# Patient Record
Sex: Male | Born: 1950
Health system: Southern US, Community
[De-identification: ages and names within clinical notes are randomized; demographics above are authoritative.]

## PROBLEM LIST (undated history)

## (undated) DIAGNOSIS — G43909 Migraine, unspecified, not intractable, without status migrainosus: Secondary | ICD-10-CM

## (undated) DIAGNOSIS — H269 Unspecified cataract: Secondary | ICD-10-CM

## (undated) DIAGNOSIS — M1990 Unspecified osteoarthritis, unspecified site: Secondary | ICD-10-CM

## (undated) DIAGNOSIS — K5790 Diverticulosis of intestine, part unspecified, without perforation or abscess without bleeding: Secondary | ICD-10-CM

## (undated) DIAGNOSIS — H9319 Tinnitus, unspecified ear: Secondary | ICD-10-CM

## (undated) DIAGNOSIS — M779 Enthesopathy, unspecified: Secondary | ICD-10-CM

## (undated) DIAGNOSIS — K219 Gastro-esophageal reflux disease without esophagitis: Secondary | ICD-10-CM

## (undated) DIAGNOSIS — Z9622 Myringotomy tube(s) status: Secondary | ICD-10-CM

## (undated) DIAGNOSIS — J45909 Unspecified asthma, uncomplicated: Secondary | ICD-10-CM

## (undated) DIAGNOSIS — E785 Hyperlipidemia, unspecified: Secondary | ICD-10-CM

## (undated) DIAGNOSIS — K635 Polyp of colon: Secondary | ICD-10-CM

## (undated) DIAGNOSIS — J329 Chronic sinusitis, unspecified: Secondary | ICD-10-CM

## (undated) HISTORY — DX: Unspecified cataract: H26.9

## (undated) HISTORY — DX: Hyperlipidemia, unspecified: E78.5

## (undated) HISTORY — DX: Migraine, unspecified, not intractable, without status migrainosus: G43.909

## (undated) HISTORY — DX: Myringotomy tube(s) status: Z96.22

## (undated) HISTORY — DX: Diverticulosis of intestine, part unspecified, without perforation or abscess without bleeding: K57.90

## (undated) HISTORY — DX: Tinnitus, unspecified ear: H93.19

## (undated) HISTORY — DX: Polyp of colon: K63.5

---
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
## 1981-09-01 HISTORY — PX: HAND SURGERY: SHX662

## 1993-09-01 HISTORY — PX: HERNIA REPAIR: SHX51

## 2005-09-01 HISTORY — PX: ELBOW SURGERY: SHX618

## 2014-09-13 LAB — HM COLONOSCOPY

## 2016-09-01 HISTORY — PX: MENISCUS REPAIR: SHX5179

## 2017-09-01 HISTORY — PX: EYE SURGERY: SHX253

## 2017-12-21 DIAGNOSIS — G43909 Migraine, unspecified, not intractable, without status migrainosus: Secondary | ICD-10-CM | POA: Insufficient documentation

## 2018-07-08 LAB — LIPID PANEL
Cholesterol: 178 (ref 0–200)
HDL: 74 — AB (ref 35–70)
LDL Cholesterol: 89
Triglycerides: 66 (ref 40–160)

## 2018-07-08 LAB — TSH: TSH: 0.69 (ref 0.41–5.90)

## 2018-07-08 LAB — HEPATIC FUNCTION PANEL
ALT: 31 (ref 10–40)
AST: 18 (ref 14–40)
Alkaline Phosphatase: 99 (ref 25–125)
Bilirubin, Total: 0.3

## 2018-07-08 LAB — BASIC METABOLIC PANEL
BUN: 21 (ref 4–21)
Chloride: 104 (ref 99–108)
Creatinine: 1.3 (ref 0.6–1.3)
Glucose: 109
Potassium: 4.8 (ref 3.4–5.3)
Sodium: 139 (ref 137–147)

## 2018-07-08 LAB — COMPREHENSIVE METABOLIC PANEL
Albumin: 4.3 (ref 3.5–5.0)
Calcium: 9.3 (ref 8.7–10.7)

## 2018-11-03 LAB — HM HIV SCREENING LAB: HM HIV Screening: NEGATIVE

## 2018-11-03 LAB — HM HEPATITIS C SCREENING LAB: HM Hepatitis Screen: NEGATIVE

## 2019-03-09 LAB — HM COLONOSCOPY

## 2019-04-06 LAB — HEMOGLOBIN A1C: Hemoglobin A1C: 5.4

## 2019-06-17 ENCOUNTER — Other Ambulatory Visit: Payer: Self-pay

## 2019-06-17 DIAGNOSIS — Z20822 Contact with and (suspected) exposure to covid-19: Secondary | ICD-10-CM

## 2019-06-19 LAB — NOVEL CORONAVIRUS, NAA: SARS-CoV-2, NAA: NOT DETECTED

## 2019-06-25 ENCOUNTER — Telehealth: Payer: Self-pay

## 2019-06-25 NOTE — Telephone Encounter (Signed)
Pt called to update SSN for MyChart enrollment.

## 2019-07-07 ENCOUNTER — Encounter: Payer: Self-pay | Admitting: Family Medicine

## 2019-07-07 ENCOUNTER — Ambulatory Visit (INDEPENDENT_AMBULATORY_CARE_PROVIDER_SITE_OTHER): Payer: Medicare PPO | Admitting: Family Medicine

## 2019-07-07 ENCOUNTER — Other Ambulatory Visit: Payer: Self-pay

## 2019-07-07 VITALS — BP 110/80 | HR 60 | Ht 66.0 in | Wt 156.0 lb

## 2019-07-07 DIAGNOSIS — E785 Hyperlipidemia, unspecified: Secondary | ICD-10-CM | POA: Diagnosis not present

## 2019-07-07 DIAGNOSIS — Z7689 Persons encountering health services in other specified circumstances: Secondary | ICD-10-CM | POA: Diagnosis not present

## 2019-07-07 DIAGNOSIS — Z23 Encounter for immunization: Secondary | ICD-10-CM

## 2019-07-07 MED ORDER — ROSUVASTATIN CALCIUM 10 MG PO TABS: 10.0000 mg | ORAL_TABLET | Freq: Every day | ORAL | 1 refills | Status: DC

## 2019-07-07 NOTE — Progress Notes (Signed)
Date:  07/07/2019   Name:  Blake Patrick   DOB:  [birth year]   MRN:  [id number]   Chief Complaint: Establish Care (pcp) and Flu Vaccine  Patient is a 68 year old male who presents for a establish care exam. The patient reports the following problems: daily migraine. Health maintenance has been reviewed needs influenza.  Hyperlipidemia This is a chronic problem. The current episode started more than 1 year ago. The problem is controlled. Recent lipid tests were reviewed and are normal. He has no history of chronic renal disease, diabetes, hypothyroidism, liver disease, obesity or nephrotic syndrome. There are no known factors aggravating his hyperlipidemia. Pertinent negatives include no chest pain, focal sensory loss, focal weakness, leg pain, myalgias or shortness of breath. Current antihyperlipidemic treatment includes statins. The current treatment provides moderate improvement of lipids. There are no compliance problems.  Risk factors for coronary artery disease include dyslipidemia and male sex.    Review of Systems  Constitutional: Negative for chills and fever.  HENT: Negative for drooling, ear discharge, ear pain and sore throat.   Respiratory: Negative for cough, shortness of breath and wheezing.   Cardiovascular: Negative for chest pain, palpitations and leg swelling.  Gastrointestinal: Negative for abdominal pain, blood in stool, constipation, diarrhea and nausea.  Endocrine: Negative for polydipsia.  Genitourinary: Negative for dysuria, frequency, hematuria and urgency.  Musculoskeletal: Negative for back pain, myalgias and neck pain.  Skin: Negative for rash.  Allergic/Immunologic: Negative for environmental allergies.  Neurological: Negative for dizziness, focal weakness and headaches.  Hematological: Does not bruise/bleed easily.  Psychiatric/Behavioral: Negative for suicidal ideas. The patient is not nervous/anxious.     Patient Active Problem List   Diagnosis Date Noted   . Migraines 12/21/2017    No Known Allergies  Past Surgical History:  Procedure Laterality Date  . ELBOW SURGERY Right 2007  . HAND SURGERY  1983  . HERNIA REPAIR  1995  . MENISCUS REPAIR Left 2018  . TONSILLECTOMY  1964    Social History   Tobacco Use  . Smoking status: Former Smoker    Types: Cigarettes    Quit date: 09/01/1988    Years since quitting: 30.8  . Smokeless tobacco: Never Used  Substance Use Topics  . Alcohol use: Yes    Alcohol/week: 1.0 standard drinks    Types: 1 Cans of beer per week    Comment: per week  . Drug use: Never     Medication list has been reviewed and updated.  Current Meds  Medication Sig  . BOTOX 100 units SOLR injection   . Calcium Carbonate-Vitamin D (RA CALCIUM PLUS VITAMIN D PO) Take 525 mg by mouth 2 (two) times daily.  . Cholecalciferol (VITAMIN D) 50 MCG (2000 UT) CAPS Take 2 capsules by mouth daily.  . [employer] TRIPLE MAGNESIUM COMPLEX PO Take 300 mg by mouth 2 (two) times daily.  . divalproex (DEPAKOTE) 250 MG DR tablet Take 250 mg by mouth daily.  . Glucosamine-Chondroitin (GLUCOSAMINE CHONDR COMPLEX PO) Take 3,700 mg by mouth daily.  Marland Kitchen lamoTRIgine (LAMICTAL) 200 MG tablet Take 200 mg by mouth 2 (two) times daily.  Marland Kitchen levofloxacin (LEVAQUIN) 500 MG tablet   . Magnesium 400 MG CAPS Take 1 capsule by mouth 2 (two) times daily.  . polycarbophil (FIBERCON) 625 MG tablet Take 1,250 mg by mouth 2 (two) times daily.  . Riboflavin 400 MG TABS Take 1 tablet by mouth daily.  . rosuvastatin (CRESTOR) 10 MG tablet Take 10 mg  by mouth daily.  . vitamin C (ASCORBIC ACID) 500 MG tablet Take 500 mg by mouth daily.  . Vitamin E 180 MG CAPS Take 1 capsule by mouth daily.  . Vitamins-Lipotropics (LIPO-FLAVONOID PLUS PO) Take 1,000 mg by mouth 2 (two) times daily.  . Zinc 50 MG CAPS Take 1 capsule by mouth daily.    PHQ 2/9 Scores 07/07/2019  PHQ - 2 Score 0  PHQ- 9 Score 0    BP Readings from Last 3 Encounters:  07/07/19 110/80     Physical Exam Vitals signs and nursing note reviewed.  HENT:     Head: Normocephalic.     Right Ear: Tympanic membrane, ear canal and external ear normal.     Left Ear: Tympanic membrane, ear canal and external ear normal.     Nose: Nose normal.  Eyes:     General: No scleral icterus.       Right eye: No discharge.        Left eye: No discharge.     Conjunctiva/sclera: Conjunctivae normal.     Pupils: Pupils are equal, round, and reactive to light.  Neck:     Musculoskeletal: Normal range of motion and neck supple.     Thyroid: No thyromegaly.     Vascular: No JVD.     Trachea: No tracheal deviation.  Cardiovascular:     Rate and Rhythm: Normal rate and regular rhythm.     Heart sounds: Normal heart sounds. No murmur. No friction rub. No gallop.   Pulmonary:     Effort: No respiratory distress.     Breath sounds: Normal breath sounds. No wheezing, rhonchi or rales.  Abdominal:     General: Bowel sounds are normal.     Palpations: Abdomen is soft. There is no mass.     Tenderness: There is no abdominal tenderness. There is no guarding or rebound.  Musculoskeletal: Normal range of motion.        General: No tenderness.  Lymphadenopathy:     Cervical: No cervical adenopathy.  Skin:    General: Skin is warm.     Findings: No rash.  Neurological:     General: No focal deficit present.     Mental Status: He is alert and oriented to person, place, and time.     Cranial Nerves: No cranial nerve deficit.     Deep Tendon Reflexes: Reflexes are normal and symmetric.     Wt Readings from Last 3 Encounters:  07/07/19 156 lb (70.8 kg)    BP 110/80   Pulse 60   Ht 5\' 6"  (1.676 m)   Wt 156 lb (70.8 kg)   BMI 25.18 kg/m   Assessment and Plan: 1. Establishing care with new doctor, encounter for Patient establishes care with new physician.  Patient's previous encounters, past medical history, and other diagnoses were discussed.  Patient will be seen neurologist for follow-up of  his migraines and further treatment.  2. Hyperlipidemia, unspecified hyperlipidemia type Chronic.  Controlled.  Continue rosuvastatin 10 mg once a day.  Will recheck in 6 months at which time we will do physical exam and obtain a lipid panel in a fasting setting. - rosuvastatin (CRESTOR) 10 MG tablet; Take 1 tablet (10 mg total) by mouth daily.  Dispense: 90 tablet; Refill: 1  3. Influenza vaccine needed Discussed and administered - Flu Vaccine QUAD High Dose(Fluad)

## 2019-09-06 DIAGNOSIS — I861 Scrotal varices: Secondary | ICD-10-CM | POA: Diagnosis not present

## 2019-09-20 DIAGNOSIS — H43812 Vitreous degeneration, left eye: Secondary | ICD-10-CM | POA: Diagnosis not present

## 2019-09-21 DIAGNOSIS — R519 Headache, unspecified: Secondary | ICD-10-CM | POA: Diagnosis not present

## 2019-09-21 DIAGNOSIS — I6782 Cerebral ischemia: Secondary | ICD-10-CM | POA: Diagnosis not present

## 2019-09-23 ENCOUNTER — Other Ambulatory Visit: Payer: Self-pay

## 2019-09-23 ENCOUNTER — Encounter: Payer: Self-pay | Admitting: Family Medicine

## 2019-09-27 DIAGNOSIS — N451 Epididymitis: Secondary | ICD-10-CM | POA: Diagnosis not present

## 2019-09-27 DIAGNOSIS — M76892 Other specified enthesopathies of left lower limb, excluding foot: Secondary | ICD-10-CM | POA: Diagnosis not present

## 2019-10-05 DIAGNOSIS — J3489 Other specified disorders of nose and nasal sinuses: Secondary | ICD-10-CM | POA: Diagnosis not present

## 2019-10-05 DIAGNOSIS — J321 Chronic frontal sinusitis: Secondary | ICD-10-CM | POA: Diagnosis not present

## 2019-10-05 DIAGNOSIS — R519 Headache, unspecified: Secondary | ICD-10-CM | POA: Diagnosis not present

## 2019-10-06 ENCOUNTER — Other Ambulatory Visit: Payer: Self-pay | Admitting: Otolaryngology

## 2019-10-06 ENCOUNTER — Other Ambulatory Visit (HOSPITAL_COMMUNITY): Payer: Self-pay | Admitting: Otolaryngology

## 2019-10-06 DIAGNOSIS — J329 Chronic sinusitis, unspecified: Secondary | ICD-10-CM

## 2019-10-13 ENCOUNTER — Other Ambulatory Visit: Payer: Self-pay

## 2019-10-13 ENCOUNTER — Ambulatory Visit
Admission: RE | Admit: 2019-10-13 | Discharge: 2019-10-13 | Disposition: A | Discharge status: Home / Self Care | Payer: Medicare PPO | Source: Ambulatory Visit | Attending: Otolaryngology | Admitting: Otolaryngology

## 2019-10-13 DIAGNOSIS — J329 Chronic sinusitis, unspecified: Secondary | ICD-10-CM | POA: Diagnosis not present

## 2019-10-17 DIAGNOSIS — J321 Chronic frontal sinusitis: Secondary | ICD-10-CM | POA: Diagnosis not present

## 2019-10-17 DIAGNOSIS — J342 Deviated nasal septum: Secondary | ICD-10-CM | POA: Diagnosis not present

## 2019-10-18 DIAGNOSIS — G43711 Chronic migraine without aura, intractable, with status migrainosus: Secondary | ICD-10-CM | POA: Diagnosis not present

## 2019-10-18 DIAGNOSIS — G4452 New daily persistent headache (NDPH): Secondary | ICD-10-CM | POA: Diagnosis not present

## 2019-10-18 DIAGNOSIS — R519 Headache, unspecified: Secondary | ICD-10-CM | POA: Diagnosis not present

## 2019-10-20 ENCOUNTER — Encounter: Payer: Self-pay | Admitting: Otolaryngology

## 2019-10-24 NOTE — Discharge Instructions (Signed)
[facility] ENDOSCOPIC SINUS SURGERY [facility]  What is Functional Endoscopic Sinus Surgery?  The Surgery involves making the natural openings of the sinuses larger by removing the bony partitions that separate the sinuses from the nasal cavity.  The natural sinus lining is preserved as much as possible to allow the sinuses to resume normal function after the surgery.  In some patients nasal polyps (excessively swollen lining of the sinuses) may be removed to relieve obstruction of the sinus openings.  The surgery is performed through the nose using lighted scopes, which eliminates the need for incisions on the face.  A septoplasty is a different procedure which is sometimes performed with sinus surgery.  It involves straightening the boy partition that separates the two sides of your nose.  A crooked or deviated septum may need repair if is obstructing the sinuses or nasal airflow.  Turbinate reduction is also often performed during sinus surgery.  The turbinates are bony proturberances from the side walls of the nose which swell and can obstruct the nose in patients with sinus and allergy problems.  Their size can be surgically reduced to help relieve nasal obstruction.  What Can Sinus Surgery Do For Me?  Sinus surgery can reduce the frequency of sinus infections requiring antibiotic treatment.  This can provide improvement in nasal congestion, post-nasal drainage, facial pressure and nasal obstruction.  Surgery will NOT prevent you from ever having an infection again, so it usually only for patients who get infections 4 or more times yearly requiring antibiotics, or for infections that do not clear with antibiotics.  It will not cure nasal allergies, so patients with allergies may still require medication to treat their allergies after surgery. Surgery may improve headaches related to sinusitis, however, some people will continue to  require medication to control sinus headaches related to allergies.  Surgery will do nothing for other forms of headache (migraine, tension or cluster).  What Are the Risks of Endoscopic Sinus Surgery?  Current techniques allow surgery to be performed safely with little risk, however, there are rare complications that patients should be aware of.  Because the sinuses are located around the eyes, there is risk of eye injury, including blindness, though again, this would be quite rare. This is usually a result of bleeding behind the eye during surgery, which puts the vision oat risk, though there are treatments to protect the vision and prevent permanent disrupted by surgery causing a leak of the spinal fluid that surrounds the brain.  More serious complications would include bleeding inside the brain cavity or damage to the brain.  Again, all of these complications are uncommon, and spinal fluid leaks can be safely managed surgically if they occur.  The most common complication of sinus surgery is bleeding from the nose, which may require packing or cauterization of the nose.  Continued sinus have polyps may experience recurrence of the polyps requiring revision surgery.  Alterations of sense of smell or injury to the tear ducts are also rare complications.   What is the Surgery Like, and what is the Recovery?  The Surgery usually takes a couple of hours to perform, and is usually performed under a general anesthetic (completely asleep).  Patients are usually discharged home after a couple of hours.  Sometimes during surgery it is necessary to pack the nose to control bleeding, and the packing is left in place for 24 - 48 hours, and removed by your surgeon.    If a septoplasty was performed during the procedure, there is often a splint placed which must be removed after 5-7 days.   Discomfort: Pain is usually mild to moderate, and can be controlled by prescription pain medication or acetaminophen (Tylenol).   Aspirin, Ibuprofen (Advil, Motrin), or Naprosyn (Aleve) should be avoided, as they can cause increased bleeding.  Most patients feel sinus pressure like they have a bad head cold for several days.  Sleeping with your head elevated can help reduce swelling and facial pressure, as can ice packs over the face.  A humidifier may be helpful to keep the mucous and blood from drying in the nose.   Diet: There are no specific diet restrictions, however, you should generally start with clear liquids and a light diet of bland foods because the anesthetic can cause some nausea.  Advance your diet depending on how your stomach feels.  Taking your pain medication with food will often help reduce stomach upset which pain medications can cause.  Nasal Saline Irrigation: It is important to remove blood clots and dried mucous from the nose as it is healing.  This is done by having you irrigate the nose at least 3 - 4 times daily with a salt water solution.  We recommend using NeilMed Sinus Rinse (available at the drug store).  Fill the squeeze bottle with the solution, bend over a sink, and insert the tip of the squeeze bottle into the nose  of an inch.  Point the tip of the squeeze bottle towards the inside corner of the eye on the same side your irrigating.  Squeeze the bottle and gently irrigate the nose.  If you bend forward as you do this, most of the fluid will flow back out of the nose, instead of down your throat.   The solution should be warm, near body temperature, when you irrigate.   Each time you irrigate, you should use a full squeeze bottle.   Note that if you are instructed to use Nasal Steroid Sprays at any time after your surgery, irrigate with saline BEFORE using the steroid spray, so you do not wash it all out of the nose. Another product, Nasal Saline Gel (such as AYR Nasal Saline Gel) can be applied in each nostril 3 - 4 times daily to moisture the nose and reduce scabbing or crusting.  Bleeding:   Bloody drainage from the nose can be expected for several days, and patients are instructed to irrigate their nose frequently with salt water to help remove mucous and blood clots.  The drainage may be dark red or brown, though some fresh blood may be seen intermittently, especially after irrigation.  Do not blow you nose, as bleeding may occur. If you must sneeze, keep your mouth open to allow air to escape through your mouth.  If heavy bleeding occurs: Irrigate the nose with saline to rinse out clots, then spray the nose 3 - 4 times with Afrin Nasal Decongestant Spray.  The spray will constrict the blood vessels to slow bleeding.  Pinch the lower half of your nose shut to apply pressure, and lay down with your head elevated.  Ice packs over the nose may help as well. If bleeding persists despite these measures, you should notify your doctor.  Do not use the Afrin routinely to control nasal congestion after surgery, as it can result in worsening congestion and may affect healing.     Activity: Return to work varies among patients. Most patients will be   out of work at least 5 - 7 days to recover.  Patient may return to work after they are off of narcotic pain medication, and feeling well enough to perform the functions of their job.  Patients must avoid heavy lifting (over 10 pounds) or strenuous physical for 2 weeks after surgery, so your employer may need to assign you to light duty, or keep you out of work longer if light duty is not possible.  NOTE: you should not drive, operate dangerous machinery, do any mentally demanding tasks or make any important legal or financial decisions while on narcotic pain medication and recovering from the general anesthetic.    Call Your Doctor Immediately if You Have Any of the Following: 1. Bleeding that you cannot control with the above measures 2. Loss of vision, double vision, bulging of the eye or black eyes. 3. Fever over 101 degrees 4. Neck stiffness with  severe headache, fever, nausea and change in mental state. You are always encourage to call anytime with concerns, however, please call with requests for pain medication refills during office hours.  Office Endoscopy: During follow-up visits your doctor will remove any packing or splints that may have been placed and evaluate and clean your sinuses endoscopically.  Topical anesthetic will be used to make this as comfortable as possible, though you may want to take your pain medication prior to the visit.  How often this will need to be done varies from patient to patient.  After complete recovery from the surgery, you may need follow-up endoscopy from time to time, particularly if there is concern of recurrent infection or nasal polyps.  General Anesthesia, Adult, Care After This sheet gives you information about how to care for yourself after your procedure. Your health care provider may also give you more specific instructions. If you have problems or questions, contact your health care provider. What can I expect after the procedure? After the procedure, the following side effects are common:  Pain or discomfort at the IV site.  Nausea.  Vomiting.  Sore throat.  Trouble concentrating.  Feeling cold or chills.  Weak or tired.  Sleepiness and fatigue.  Soreness and body aches. These side effects can affect parts of the body that were not involved in surgery. Follow these instructions at home:  For at least 24 hours after the procedure:  Have a responsible adult stay with you. It is important to have someone help care for you until you are awake and alert.  Rest as needed.  Do not: ? Participate in activities in which you could fall or become injured. ? Drive. ? Use heavy machinery. ? Drink alcohol. ? Take sleeping pills or medicines that cause drowsiness. ? Make important decisions or sign legal documents. ? Take care of children on your own. Eating and drinking  Follow  any instructions from your health care provider about eating or drinking restrictions.  When you feel hungry, start by eating small amounts of foods that are soft and easy to digest (bland), such as toast. Gradually return to your regular diet.  Drink enough fluid to keep your urine pale yellow.  If you vomit, rehydrate by drinking water, juice, or clear broth. General instructions  If you have sleep apnea, surgery and certain medicines can increase your risk for breathing problems. Follow instructions from your health care provider about wearing your sleep device: ? Anytime you are sleeping, including during daytime naps. ? While taking prescription pain medicines, sleeping medicines, or medicines that   make you drowsy.  Return to your normal activities as told by your health care provider. Ask your health care provider what activities are safe for you.  Take over-the-counter and prescription medicines only as told by your health care provider.  If you smoke, do not smoke without supervision.  Keep all follow-up visits as told by your health care provider. This is important. Contact a health care provider if:  You have nausea or vomiting that does not get better with medicine.  You cannot eat or drink without vomiting.  You have pain that does not get better with medicine.  You are unable to pass urine.  You develop a skin rash.  You have a fever.  You have redness around your IV site that gets worse. Get help right away if:  You have difficulty breathing.  You have chest pain.  You have blood in your urine or stool, or you vomit blood. Summary  After the procedure, it is common to have a sore throat or nausea. It is also common to feel tired.  Have a responsible adult stay with you for the first 24 hours after general anesthesia. It is important to have someone help care for you until you are awake and alert.  When you feel hungry, start by eating small amounts of  foods that are soft and easy to digest (bland), such as toast. Gradually return to your regular diet.  Drink enough fluid to keep your urine pale yellow.  Return to your normal activities as told by your health care provider. Ask your health care provider what activities are safe for you. This information is not intended to replace advice given to you by your health care provider. Make sure you discuss any questions you have with your health care provider. Document Revised: 08/21/2017 Document Reviewed: 04/03/2017 Elsevier Patient Education  2020 [employer]  

## 2019-10-25 ENCOUNTER — Other Ambulatory Visit: Payer: Self-pay

## 2019-10-25 ENCOUNTER — Other Ambulatory Visit
Admission: RE | Admit: 2019-10-25 | Discharge: 2019-10-25 | Disposition: A | Discharge status: Home / Self Care | Payer: Medicare PPO | Source: Ambulatory Visit | Attending: Otolaryngology | Admitting: Otolaryngology

## 2019-10-25 DIAGNOSIS — Z20822 Contact with and (suspected) exposure to covid-19: Secondary | ICD-10-CM | POA: Diagnosis not present

## 2019-10-25 DIAGNOSIS — Z01812 Encounter for preprocedural laboratory examination: Secondary | ICD-10-CM | POA: Diagnosis not present

## 2019-10-25 LAB — SARS CORONAVIRUS 2 (TAT 6-24 HRS): SARS Coronavirus 2: NEGATIVE

## 2019-10-27 ENCOUNTER — Ambulatory Visit
Admission: RE | Admit: 2019-10-27 | Discharge: 2019-10-27 | Disposition: A | Discharge status: Home / Self Care | Payer: Medicare PPO | Attending: Otolaryngology | Admitting: Otolaryngology

## 2019-10-27 ENCOUNTER — Ambulatory Visit: Payer: Medicare PPO | Admitting: Anesthesiology

## 2019-10-27 ENCOUNTER — Encounter: Payer: Self-pay | Admitting: Otolaryngology

## 2019-10-27 ENCOUNTER — Encounter
Admission: RE | Disposition: A | Discharge status: Home / Self Care | Payer: Self-pay | Source: Home / Self Care | Attending: Otolaryngology

## 2019-10-27 ENCOUNTER — Other Ambulatory Visit: Payer: Self-pay

## 2019-10-27 DIAGNOSIS — G43909 Migraine, unspecified, not intractable, without status migrainosus: Secondary | ICD-10-CM | POA: Diagnosis not present

## 2019-10-27 DIAGNOSIS — J329 Chronic sinusitis, unspecified: Secondary | ICD-10-CM | POA: Diagnosis not present

## 2019-10-27 DIAGNOSIS — J321 Chronic frontal sinusitis: Secondary | ICD-10-CM | POA: Diagnosis not present

## 2019-10-27 DIAGNOSIS — J342 Deviated nasal septum: Secondary | ICD-10-CM | POA: Diagnosis not present

## 2019-10-27 DIAGNOSIS — Z87891 Personal history of nicotine dependence: Secondary | ICD-10-CM | POA: Insufficient documentation

## 2019-10-27 DIAGNOSIS — Z9889 Other specified postprocedural states: Secondary | ICD-10-CM | POA: Diagnosis not present

## 2019-10-27 DIAGNOSIS — Z79899 Other long term (current) drug therapy: Secondary | ICD-10-CM | POA: Diagnosis not present

## 2019-10-27 DIAGNOSIS — J322 Chronic ethmoidal sinusitis: Secondary | ICD-10-CM | POA: Diagnosis not present

## 2019-10-27 HISTORY — DX: Unspecified osteoarthritis, unspecified site: M19.90

## 2019-10-27 HISTORY — PX: IMAGE GUIDED SINUS SURGERY: SHX6570

## 2019-10-27 HISTORY — DX: Gastro-esophageal reflux disease without esophagitis: K21.9

## 2019-10-27 HISTORY — PX: SEPTOPLASTY: SHX2393

## 2019-10-27 HISTORY — PX: ETHMOIDECTOMY: SHX5197

## 2019-10-27 SURGERY — SINUS SURGERY, WITH IMAGING GUIDANCE
Anesthesia: General | Site: Nose | Laterality: Bilateral

## 2019-10-27 MED ORDER — GLYCOPYRROLATE 0.2 MG/ML IJ SOLN
INTRAMUSCULAR | Status: DC | PRN
  Administered 2019-10-27: .1 mg via INTRAVENOUS

## 2019-10-27 MED ORDER — DEXAMETHASONE SODIUM PHOSPHATE 4 MG/ML IJ SOLN
INTRAMUSCULAR | Status: DC | PRN
  Administered 2019-10-27: 10 mg via INTRAVENOUS

## 2019-10-27 MED ORDER — HYDROCODONE-ACETAMINOPHEN 5-325 MG PO TABS: 1.0000 | ORAL_TABLET | Freq: Four times a day (QID) | ORAL | 0 refills | Status: DC | PRN

## 2019-10-27 MED ORDER — LIDOCAINE HCL (CARDIAC) PF 100 MG/5ML IV SOSY
PREFILLED_SYRINGE | INTRAVENOUS | Status: DC | PRN
  Administered 2019-10-27: 40 mg via INTRAVENOUS

## 2019-10-27 MED ORDER — FENTANYL CITRATE (PF) 100 MCG/2ML IJ SOLN
INTRAMUSCULAR | Status: DC | PRN
  Administered 2019-10-27: 50 ug via INTRAVENOUS

## 2019-10-27 MED ORDER — MIDAZOLAM HCL 5 MG/5ML IJ SOLN
INTRAMUSCULAR | Status: DC | PRN
  Administered 2019-10-27: 2 mg via INTRAVENOUS

## 2019-10-27 MED ORDER — SUCCINYLCHOLINE CHLORIDE 20 MG/ML IJ SOLN
INTRAMUSCULAR | Status: DC | PRN
  Administered 2019-10-27: 100 mg via INTRAVENOUS

## 2019-10-27 MED ORDER — ACETAMINOPHEN 10 MG/ML IV SOLN
1000.0000 mg | Freq: Once | INTRAVENOUS | Status: AC
  Administered 2019-10-27: 1000 mg via INTRAVENOUS

## 2019-10-27 MED ORDER — LIDOCAINE-EPINEPHRINE 1 %-1:100000 IJ SOLN
INTRAMUSCULAR | Status: DC | PRN
  Administered 2019-10-27: 3 mL

## 2019-10-27 MED ORDER — CEPHALEXIN 500 MG PO CAPS: 500.0000 mg | ORAL_CAPSULE | Freq: Two times a day (BID) | ORAL | 0 refills | Status: DC

## 2019-10-27 MED ORDER — FENTANYL CITRATE (PF) 100 MCG/2ML IJ SOLN: 25.0000 ug | INTRAMUSCULAR | Status: DC | PRN

## 2019-10-27 MED ORDER — OXYCODONE HCL 5 MG PO TABS
5.0000 mg | ORAL_TABLET | Freq: Once | ORAL | Status: AC | PRN
  Administered 2019-10-27: 13:00:00 5 mg via ORAL

## 2019-10-27 MED ORDER — LACTATED RINGERS IV SOLN: INTRAVENOUS | Status: DC | PRN

## 2019-10-27 MED ORDER — OXYCODONE HCL 5 MG/5ML PO SOLN: 5.0000 mg | Freq: Once | ORAL | Status: AC | PRN

## 2019-10-27 MED ORDER — PREDNISONE 10 MG PO TABS: ORAL_TABLET | ORAL | 0 refills | Status: DC

## 2019-10-27 MED ORDER — ONDANSETRON HCL 4 MG/2ML IJ SOLN: 4.0000 mg | Freq: Once | INTRAMUSCULAR | Status: DC | PRN

## 2019-10-27 MED ORDER — PHENYLEPHRINE HCL 0.5 % NA SOLN
NASAL | Status: DC | PRN
  Administered 2019-10-27: 11:00:00 15 mL via TOPICAL

## 2019-10-27 MED ORDER — PROPOFOL 10 MG/ML IV BOLUS
INTRAVENOUS | Status: DC | PRN
  Administered 2019-10-27: 130 mg via INTRAVENOUS

## 2019-10-27 MED ORDER — LACTATED RINGERS IV SOLN: 10.0000 mL/h | INTRAVENOUS | Status: DC

## 2019-10-27 MED ORDER — CEFAZOLIN SODIUM-DEXTROSE 2-4 GM/100ML-% IV SOLN
2.0000 g | Freq: Once | INTRAVENOUS | Status: AC
  Administered 2019-10-27: 2 g via INTRAVENOUS

## 2019-10-27 MED ORDER — OXYMETAZOLINE HCL 0.05 % NA SOLN
2.0000 | Freq: Once | NASAL | Status: AC
  Administered 2019-10-27: 09:00:00 2 via NASAL

## 2019-10-27 MED ORDER — EPHEDRINE SULFATE 50 MG/ML IJ SOLN
INTRAMUSCULAR | Status: DC | PRN
  Administered 2019-10-27: 5 mg via INTRAVENOUS
  Administered 2019-10-27 (×4): 10 mg via INTRAVENOUS
  Administered 2019-10-27: 5 mg via INTRAVENOUS

## 2019-10-27 SURGICAL SUPPLY — 41 items
BALLN CATH EUST TUBE 6X16 (BALLOONS)
BALLN SINUPLASTY KIT 6X16 (BALLOONS) ×3
BALLOON SINUPLASTY KIT 6X16 (BALLOONS) ×1 IMPLANT
BATTERY INSTRU NAVIGATION (MISCELLANEOUS) ×9 IMPLANT
CANISTER SUCT 1200ML W/VALVE (MISCELLANEOUS) ×3 IMPLANT
CATH BALLOON EUST TUBE 6X16 (BALLOONS) IMPLANT
CATH IV 18X1 1/4 SAFELET (CATHETERS) ×3 IMPLANT
COAGULATOR SUCT 8FR VV (MISCELLANEOUS) ×3 IMPLANT
DEVICE INFLATION SEID (MISCELLANEOUS) IMPLANT
ELECT REM PT RETURN 9FT ADLT (ELECTROSURGICAL) ×3
ELECTRODE REM PT RTRN 9FT ADLT (ELECTROSURGICAL) ×1 IMPLANT
GLOVE PI ULTRA LF STRL 7.5 (GLOVE) ×2 IMPLANT
GLOVE PI ULTRA NON LATEX 7.5 (GLOVE) ×4
GOWN STRL REUS W/ TWL LRG LVL3 (GOWN DISPOSABLE) ×1 IMPLANT
GOWN STRL REUS W/TWL LRG LVL3 (GOWN DISPOSABLE) ×2
IV CATH 18X1 1/4 SAFELET (CATHETERS) ×1
IV NS 500ML (IV SOLUTION) ×2
IV NS 500ML BAXH (IV SOLUTION) ×1 IMPLANT
KIT TURNOVER KIT A (KITS) ×3 IMPLANT
NEEDLE ANESTHESIA  27G X 3.5 (NEEDLE) ×2
NEEDLE ANESTHESIA 27G X 3.5 (NEEDLE) ×1 IMPLANT
NEEDLE HYPO 27GX1-1/4 (NEEDLE) ×3 IMPLANT
NS IRRIG 500ML POUR BTL (IV SOLUTION) ×3 IMPLANT
PACK ENT CUSTOM (PACKS) ×3 IMPLANT
PACKING NASAL EPIS 4X2.4 XEROG (MISCELLANEOUS) ×9 IMPLANT
PATTIES SURGICAL .5 X3 (DISPOSABLE) ×3 IMPLANT
SHAVER DIEGO BLD STD TYPE A (BLADE) ×3 IMPLANT
SOL ANTI-FOG 6CC FOG-OUT (MISCELLANEOUS) ×1 IMPLANT
SOL FOG-OUT ANTI-FOG 6CC (MISCELLANEOUS) ×2
SPLINT NASAL SEPTAL BLV .50 ST (MISCELLANEOUS) ×3 IMPLANT
STRAP BODY AND KNEE 60X3 (MISCELLANEOUS) ×3 IMPLANT
SUT CHROMIC 3-0 (SUTURE) ×2
SUT CHROMIC 3-0 KS 27XMFL CR (SUTURE) ×1
SUT ETHILON 3-0 KS 30 BLK (SUTURE) ×3 IMPLANT
SUT PLAIN GUT 4-0 (SUTURE) ×3 IMPLANT
SUTURE CHRMC 3-0 KS 27XMFL CR (SUTURE) ×1 IMPLANT
SYR 3ML LL SCALE MARK (SYRINGE) ×3 IMPLANT
TOWEL OR 17X26 4PK STRL BLUE (TOWEL DISPOSABLE) ×3 IMPLANT
TRACKER CRANIALMASK (MASK) ×3 IMPLANT
TUBING DECLOG MULTIDEBRIDER (TUBING) ×3 IMPLANT
WATER STERILE IRR 250ML POUR (IV SOLUTION) ×3 IMPLANT

## 2019-10-27 NOTE — H&P (Signed)
H&P has been reviewed and patient reevaluated, no changes necessary. To be downloaded later.  

## 2019-10-27 NOTE — Transfer of Care (Signed)
Immediate Anesthesia Transfer of Care Note  Patient: Blake Patrick  Procedure(s) Performed: IMAGE GUIDED SINUS SURGERY (Bilateral Nose) TOTAL ETHMOIDECTOMY WITH FRONTAL SINUOTOMY (Bilateral Nose) SEPTOPLASTY REVISION (Bilateral Nose)  Patient Location: PACU  Anesthesia Type: General ETT  Level of Consciousness: awake, alert  and patient cooperative  Airway and Oxygen Therapy: Patient Spontanous Breathing and Patient connected to supplemental oxygen  Post-op Assessment: Post-op Vital signs reviewed, Patient's Cardiovascular Status Stable, Respiratory Function Stable, Patent Airway and No signs of Nausea or vomiting  Post-op Vital Signs: Reviewed and stable  Complications: No apparent anesthesia complications

## 2019-10-27 NOTE — Anesthesia Procedure Notes (Signed)
Procedure Name: Intubation Date/Time: 10/27/2019 10:36 AM Performed by: Jimmy Picket, CRNA Pre-anesthesia Checklist: Patient identified, Emergency Drugs available, Suction available, Patient being monitored and Timeout performed Patient Re-evaluated:Patient Re-evaluated prior to induction Oxygen Delivery Method: Circle system utilized Preoxygenation: Pre-oxygenation with 100% oxygen Induction Type: IV induction Ventilation: Mask ventilation without difficulty Laryngoscope Size: Miller and 3 Grade View: Grade I Tube type: Oral Rae Tube size: 7.5 mm Number of attempts: 2 Airway Equipment and Method: Bougie stylet Placement Confirmation: ETT inserted through vocal cords under direct vision,  positive ETCO2 and breath sounds checked- equal and bilateral Tube secured with: Tape Dental Injury: Teeth and Oropharynx as per pre-operative assessment  Comments: Grade II-III view with Miller 3 blade. View of arytenoids and base of vocal cords on 2nd attempt. Boujie passed without difficulty. ETT inserted over boujie successfully. +/=BBS.

## 2019-10-27 NOTE — Anesthesia Preprocedure Evaluation (Signed)
Anesthesia Evaluation  Patient identified by MRN, date of birth, ID band Patient awake    Reviewed: Allergy & Precautions, H&P , NPO status , Patient's Chart, lab work & pertinent test results  Airway Mallampati: II  TM Distance: >3 FB Neck ROM: full    Dental no notable dental hx.    Pulmonary former smoker,    Pulmonary exam normal breath sounds clear to auscultation       Cardiovascular Normal cardiovascular exam Rhythm:regular Rate:Normal     Neuro/Psych  Headaches,    GI/Hepatic GERD  ,  Endo/Other    Renal/GU      Musculoskeletal   Abdominal   Peds  Hematology   Anesthesia Other Findings   Reproductive/Obstetrics                             Anesthesia Physical Anesthesia Plan  ASA: II  Anesthesia Plan: General ETT   Post-op Pain Management:    Induction:   PONV Risk Score and Plan: 2 and Treatment may vary due to age or medical condition, Midazolam, Ondansetron and Dexamethasone  Airway Management Planned:   Additional Equipment:   Intra-op Plan:   Post-operative Plan:   Informed Consent: I have reviewed the patients History and Physical, chart, labs and discussed the procedure including the risks, benefits and alternatives for the proposed anesthesia with the patient or authorized representative who has indicated his/her understanding and acceptance.     Dental Advisory Given  Plan Discussed with: CRNA  Anesthesia Plan Comments:         Anesthesia Quick Evaluation

## 2019-10-27 NOTE — Anesthesia Postprocedure Evaluation (Signed)
Anesthesia Post Note  Patient: Lebert Lovern  Procedure(s) Performed: IMAGE GUIDED SINUS SURGERY (Bilateral Nose) TOTAL ETHMOIDECTOMY WITH FRONTAL SINUOTOMY (Bilateral Nose) SEPTOPLASTY REVISION (Bilateral Nose)     Patient location during evaluation: PACU Anesthesia Type: General Level of consciousness: awake and alert and oriented Pain management: satisfactory to patient Vital Signs Assessment: post-procedure vital signs reviewed and stable Respiratory status: spontaneous breathing, nonlabored ventilation and respiratory function stable Cardiovascular status: blood pressure returned to baseline and stable Postop Assessment: Adequate PO intake and No signs of nausea or vomiting Anesthetic complications: no    Cherly Beach

## 2019-10-27 NOTE — Op Note (Signed)
10/27/2019  12:46 PM    Jeanmarc, Viernes  [id number]   Pre-Op Dx: Chronic bilateral ethmoid sinusitis, chronic bilateral frontal sinusitis, superior septum deviated to the right side.  Post-op Dx: Same  Proc: Bilateral endoscopic total ethmoidectomy with frontal sinus exploration, septoplasty revision, use of image guided system  Surg:  Elon Alas Sophya Vanblarcom  Anes:  GOT  EBL: 100 mL  Comp: None  Findings: Very thick bone in both ethmoid sinuses and scarred over tissue.  His left posterior ethmoid had thick clear white mucoid drainage completely filling the sinus.  In the right frontal sinus where he had a cyst I used the Acclarent balloon system to dilate the opening and crush the cyst to make sure the sinus was open and clear.  There is a lot of scarred down bone in the anterior ethmoids on the right side   Procedure: The patient was brought to the operating room and placed in supine position.  He was given general anesthesia by oral endotracheal intubation.  Once the patient was asleep the nose was prepped with 4% Xylocaine mixed with Afrin on cotton pledgets in both sides the nose.  3 mL of 1% Xylocaine with epi 1: 100,000 was used for infiltration of the superior septum.  The image guided system was brought in and the CT scan was downloaded to the disc.  The template was applied to the face and the template was registered to the system.  There was 0.7 mm of variance.  Suction instruments were registered and there was good alignment between the suction instruments and the system.  He was then prepped and draped in a sterile fashion.  The cotton pledgets were removed and the 0 degree scope was used for visualizing the airways on both sides.  The anterior septum was straight but superiorly the ethmoid plate buckled way off to the right side blocking the upper airway on the right.  The left cavity was visualized first and there was scar tissue from the middle turbinate to the septum as well as the  lateral walls.  A small through biting forcep was used to cut all the synechia to free up the turbinate.  There was some retained uncinate process and this was trimmed.  The ethmoid had been partially open but there was still lots of ethmoid air cells posteriorly and anteriorly.  The ethmoid bone was extremely thick and firm and had to break through this to open up the air cells.  The image guided system was used to make sure the deeper air cells were open.  This was used to evaluate depth of dissection.  The most posterior ethmoid air cell was completely filled with a thick white mucoid drainage like rubber cement.  This was completely cleared out and the sinus widened to provide good drainage into the other ethmoids.  Once the posterior and middle ethmoid air cells were completely opened up the 30 degree scope was used to visualize the anterior ethmoid air cells.  These were freed up and the thick bone chips were removed.  The 70 degree scope was used to visualize the opening to the frontal sinus duct and a frontal sinus Kerrison was used for widening the frontal sinus duct to make sure it would drain well.  The scopes were used to visualize all the areas with the image guided system to make sure that all the air cells were now open.  The sinuses were then filled with cotton pledgets soaked in phenylephrine for vasoconstriction.  The right side was then visualized and the 0 degree scope was used again.  There is lots of synechia around the middle turbinate again and some of these bands were freed up.  The septum buckled to the right side superiorly where the ethmoid plate bowed to the right side.  This was blocking the view of the upper sinus openings so the septoplasty revision was done next.  An incision was made halfway back on the right side of the septum just in front of the corrected ethmoid plate.  The mucoperiosteum was elevated over the right side of the ethmoid plate.  The mucoperiosteum was then  elevated over the left side of the ethmoid plate to leave it free and visible through the right nostril.  The right ethmoid plate was fractured and most of it was removed and the mucosal flap from the right side was laid back into its normal position again.  This allowed better visualization of the airway on the right side and more open airway superiorly on the right.  The 0 degree scope was used again for visualizing the ethmoid.  The uncinate process was partially retained and this was trimmed off to show the more lateral portion of the ethmoid.  The middle turbinate had been partially trimmed and was in 2 pieces.  The posterior ethmoid air cells were opened to provide good drainage.  The bone here was extremely thick as it was on the other side and had to be broken off and chunks.  The Diego microdebrider was used for some of the thickened mucous membranes but would get clogged when trying to remove the bone as it was such thick cancellous bone.  Once the posterior ethmoid air cells were opened some of the middle air cells were cleaned out as well.  The image guided system was used to make sure that all the air cells were opened.  The 30 degree scope was then used to visualize the anterior ethmoid air cells and these also had very thick bone that was overlying home.  I removed this bone to open up all the anterior ethmoid air cells as well.  At the top of the agar nasi cell he could see 1 small opening into the frontal sinus duct.  This was so small that it had poor drainage and there was a cyst of swollen tissue in the frontal sinus on this side.  The Acclarent balloon system was used to cannulate this with the lighted wire to see that it was up into the frontal sinus where I needed to be.  I was able to get past the cyst.  I was able to thread the balloon over the wire and dilate this to 12 cm of pressure.  This opened up the frontal sinus opening so you could see a better clear opening into the sinus.  I  dilated a second time to make sure that the entire mucous membrane was shrunk down and the cyst was gone.  There is a clean opening into the sinus that could be easily seen.  A cottonoid pledget was placed in the anterior ethmoid soaked in phenylephrine as I did on the other side.  The pledget were removed from the left side and this was revisualized with the 0 and 30 and 70 degree scopes.  Is no further disease noted and the sinuses were clear.  Xerogel was placed into the anterior ethmoid area and the posterior ethmoid area and slight trimming was done along the anterior  border of the middle turbinate to smooth it out.  This was covered with xerogel as well.  The inferior airway was left intact and the inferior turbinates were not addressed.  The right side was then visualized and the pledgets removed.  Again the sinuses were open visualized with different scopes and there was no sign no further disease anywhere.  The ethmoid had xerogel placed in the posterior air cell as well as into the anterior air cells around the frontal sinus duct.  And this was used to help make sure the mucosal flap of the superior septum was held in position.  The patient tolerated the procedure well.  He was awakened and taken to the recovery room in satisfactory condition.  There were no operative complications.  Dispo: To PACU to be discharged home.    Plan: To follow-up in the office in 1 week.  He will start saline flushes tomorrow about 4 times a day.  He will rest with his head elevated.  He will start his prednisone taper tomorrow and antibiotics tomorrow.  He has Tylenol with hydrocodone for pain if needed or otherwise can use just plain Tylenol.  Will call if problems  Cammy Copa  10/27/2019 12:46 PM

## 2019-10-28 ENCOUNTER — Encounter: Payer: Self-pay | Admitting: *Deleted

## 2019-10-31 DIAGNOSIS — Z48813 Encounter for surgical aftercare following surgery on the respiratory system: Secondary | ICD-10-CM | POA: Diagnosis not present

## 2019-10-31 LAB — SURGICAL PATHOLOGY

## 2019-11-03 ENCOUNTER — Ambulatory Visit (INDEPENDENT_AMBULATORY_CARE_PROVIDER_SITE_OTHER): Payer: Medicare PPO | Admitting: Family Medicine

## 2019-11-03 ENCOUNTER — Other Ambulatory Visit: Payer: Self-pay

## 2019-11-03 ENCOUNTER — Encounter: Payer: Self-pay | Admitting: Family Medicine

## 2019-11-03 VITALS — BP 118/72 | HR 100 | Ht 64.0 in | Wt 158.0 lb

## 2019-11-03 DIAGNOSIS — Z77090 Contact with and (suspected) exposure to asbestos: Secondary | ICD-10-CM | POA: Diagnosis not present

## 2019-11-03 DIAGNOSIS — Z0000 Encounter for general adult medical examination without abnormal findings: Secondary | ICD-10-CM

## 2019-11-03 DIAGNOSIS — Z0289 Encounter for other administrative examinations: Secondary | ICD-10-CM

## 2019-11-03 DIAGNOSIS — R351 Nocturia: Secondary | ICD-10-CM

## 2019-11-03 DIAGNOSIS — E781 Pure hyperglyceridemia: Secondary | ICD-10-CM | POA: Diagnosis not present

## 2019-11-03 NOTE — Progress Notes (Signed)
Date:  11/03/2019   Name:  Blake Patrick   DOB:  02-Nov-1950   MRN:  [id number]   Chief Complaint: Annual Exam and discussion CT (had CT x2 in 2017 showed 3 nodules small/stable has not had repeat )  Patient is a 69 year old male who presents for a comprehensive physical exam. The patient reports the following problems: asbestos surveillance. Health maintenance has been reviewed pneum 23/on hold   Lab Results  Component Value Date   CREATININE 1.3 07/08/2018   BUN 21 07/08/2018   NA 139 07/08/2018   K 4.8 07/08/2018   CL 104 07/08/2018   Lab Results  Component Value Date   CHOL 178 07/08/2018   HDL 74 (A) 07/08/2018   LDLCALC 89 07/08/2018   TRIG 66 07/08/2018   Lab Results  Component Value Date   TSH 0.69 07/08/2018   Lab Results  Component Value Date   HGBA1C 5.4 04/06/2019     Review of Systems  Constitutional: Negative for chills and fever.  HENT: Negative for drooling, ear discharge, ear pain and sore throat.   Respiratory: Negative for cough, shortness of breath and wheezing.   Cardiovascular: Negative for chest pain, palpitations and leg swelling.  Gastrointestinal: Negative for abdominal pain, blood in stool, constipation, diarrhea and nausea.  Endocrine: Negative for polydipsia.  Genitourinary: Negative for dysuria, frequency, hematuria and urgency.  Musculoskeletal: Negative for back pain, myalgias and neck pain.  Skin: Negative for rash.  Allergic/Immunologic: Negative for environmental allergies.  Neurological: Negative for dizziness and headaches.  Hematological: Does not bruise/bleed easily.  Psychiatric/Behavioral: Negative for suicidal ideas. The patient is not nervous/anxious.     Patient Active Problem List   Diagnosis Date Noted  . Migraines 12/21/2017    No Known Allergies  Past Surgical History:  Procedure Laterality Date  . ELBOW SURGERY Right 2007  . ETHMOIDECTOMY Bilateral 10/27/2019   Procedure: TOTAL ETHMOIDECTOMY WITH FRONTAL  SINUOTOMY;  Surgeon: Vernie Murders, MD;  Location: [facility];  Service: ENT;  Laterality: Bilateral;  . HAND SURGERY  1983  . HERNIA REPAIR  1995  . IMAGE GUIDED SINUS SURGERY Bilateral 10/27/2019   Procedure: IMAGE GUIDED SINUS SURGERY;  Surgeon: Vernie Murders, MD;  Location: [facility];  Service: ENT;  Laterality: Bilateral;  need stryker disk placed disk on or charge nurse desk 2-17   kp  . MENISCUS REPAIR Left 2018  . SEPTOPLASTY Bilateral 10/27/2019   Procedure: SEPTOPLASTY REVISION;  Surgeon: Vernie Murders, MD;  Location: [facility];  Service: ENT;  Laterality: Bilateral;  . TONSILLECTOMY  1964    Social History   Tobacco Use  . Smoking status: Former Smoker    Types: Cigarettes    Quit date: 09/01/1988    Years since quitting: 31.1  . Smokeless tobacco: Never Used  Substance Use Topics  . Alcohol use: Yes    Comment: rarely  . Drug use: Never     Medication list has been reviewed and updated.  Current Meds  Medication Sig  . Calcium Carbonate-Vitamin D (RA CALCIUM PLUS VITAMIN D PO) Take 525 mg by mouth 2 (two) times daily.  . Cholecalciferol (VITAMIN D) 50 MCG (2000 UT) CAPS Take 2 capsules by mouth daily.  . CVS TRIPLE MAGNESIUM COMPLEX PO Take 300 mg by mouth 2 (two) times daily.  . Glucosamine-Chondroitin (GLUCOSAMINE CHONDR COMPLEX PO) Take 3,700 mg by mouth daily.  . Magnesium 400 MG CAPS Take 1 capsule by mouth 2 (two) times daily.  Marland Kitchen  polycarbophil (FIBERCON) 625 MG tablet Take 1,250 mg by mouth 2 (two) times daily.  . Riboflavin 400 MG TABS Take 1 tablet by mouth daily.  . rosuvastatin (CRESTOR) 10 MG tablet Take 1 tablet (10 mg total) by mouth daily.  . vitamin C (ASCORBIC ACID) 500 MG tablet Take 500 mg by mouth daily.  . Vitamin E 180 MG CAPS Take 1 capsule by mouth daily.  . Vitamins-Lipotropics (LIPO-FLAVONOID PLUS PO) Take 1,000 mg by mouth 2 (two) times daily.  . Zinc 50 MG CAPS Take 1 capsule by mouth daily.    PHQ 2/9  Scores 11/03/2019 07/07/2019  PHQ - 2 Score 0 0  PHQ- 9 Score 0 0    BP Readings from Last 3 Encounters:  11/03/19 118/72  10/27/19 118/85  07/07/19 110/80    Physical Exam Vitals and nursing note reviewed.  Constitutional:      General: He is awake. He is not in acute distress.    Appearance: Normal appearance. He is well-groomed and overweight. He is not ill-appearing, toxic-appearing or diaphoretic.  HENT:     Head: Normocephalic.     Right Ear: Tympanic membrane, ear canal and external ear normal. There is no impacted cerumen.     Left Ear: Tympanic membrane, ear canal and external ear normal. There is no impacted cerumen.     Nose: Nose normal. No congestion or rhinorrhea.  Eyes:     General: No scleral icterus.       Right eye: No discharge.        Left eye: No discharge.     Conjunctiva/sclera: Conjunctivae normal.     Pupils: Pupils are equal, round, and reactive to light.  Neck:     Thyroid: No thyromegaly.     Vascular: No JVD.     Trachea: No tracheal deviation.  Cardiovascular:     Rate and Rhythm: Normal rate and regular rhythm.     Heart sounds: Normal heart sounds. No murmur. No friction rub. No gallop.   Pulmonary:     Effort: No respiratory distress.     Breath sounds: Normal breath sounds. No wheezing, rhonchi or rales.  Abdominal:     General: Bowel sounds are normal.     Palpations: Abdomen is soft. There is no mass.     Tenderness: There is no abdominal tenderness. There is no guarding or rebound.  Musculoskeletal:        General: No tenderness. Normal range of motion.     Cervical back: Normal range of motion and neck supple.  Lymphadenopathy:     Cervical: No cervical adenopathy.  Skin:    General: Skin is warm.     Findings: No rash.  Neurological:     Mental Status: He is alert and oriented to person, place, and time.     Cranial Nerves: No cranial nerve deficit.     Deep Tendon Reflexes: Reflexes are normal and symmetric.  Psychiatric:         Behavior: Behavior is cooperative.     Wt Readings from Last 3 Encounters:  11/03/19 158 lb (71.7 kg)  10/27/19 153 lb (69.4 kg)  07/07/19 156 lb (70.8 kg)    BP 118/72   Pulse 100   Ht 5\' 4"  (1.626 m)   Wt 158 lb (71.7 kg)   BMI 27.12 kg/m   Assessment and Plan: 1. Encounter for annual physical exam No subjective/objective concerns noted during the history and physical exam.  Patient's past encounters, most recent  labs, most recent imaging as well as care elsewhere were reviewed.Blake Patrick is a 69 y.o. male who presents today for his Complete Annual Exam. He feels well. He reports exercising . He reports he is sleeping well. . - Renal Function Panel - Lipid Panel With LDL/HDL Ratio  2. 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  Patient will be referred to pulmonary for evaluation and if necessary treatment. - Ambulatory referral to Pulmonology  3. Nocturia Patient with history of occasional nocturia and we will check a PSA.  DRE was noted to be normal. - PSA

## 2019-11-03 NOTE — Patient Instructions (Signed)

## 2019-11-04 LAB — RENAL FUNCTION PANEL
Albumin: 3.9 g/dL (ref 3.8–4.8)
BUN/Creatinine Ratio: 13 (ref 10–24)
BUN: 16 mg/dL (ref 8–27)
CO2: 24 mmol/L (ref 20–29)
Calcium: 9.3 mg/dL (ref 8.6–10.2)
Chloride: 99 mmol/L (ref 96–106)
Creatinine, Ser: 1.21 mg/dL (ref 0.76–1.27)
GFR calc Af Amer: 72 mL/min/{1.73_m2} (ref >59)
GFR calc non Af Amer: 62 mL/min/{1.73_m2} (ref >59)
Glucose: 117 mg/dL — ABNORMAL HIGH (ref 65–99)
Phosphorus: 3.6 mg/dL (ref 2.8–4.1)
Potassium: 4.7 mmol/L (ref 3.5–5.2)
Sodium: 139 mmol/L (ref 134–144)

## 2019-11-04 LAB — PSA: Prostate Specific Ag, Serum: 1 ng/mL (ref 0.0–4.0)

## 2019-11-04 LAB — LIPID PANEL WITH LDL/HDL RATIO
Cholesterol, Total: 177 mg/dL (ref 100–199)
HDL: 62 mg/dL (ref >39)
LDL Chol Calc (NIH): 85 mg/dL (ref 0–99)
LDL/HDL Ratio: 1.4 ratio (ref 0.0–3.6)
Triglycerides: 180 mg/dL — ABNORMAL HIGH (ref 0–149)
VLDL Cholesterol Cal: 30 mg/dL (ref 5–40)

## 2019-11-07 DIAGNOSIS — Z48813 Encounter for surgical aftercare following surgery on the respiratory system: Secondary | ICD-10-CM | POA: Diagnosis not present

## 2019-11-08 DIAGNOSIS — J9811 Atelectasis: Secondary | ICD-10-CM | POA: Diagnosis not present

## 2019-11-08 DIAGNOSIS — Z77090 Contact with and (suspected) exposure to asbestos: Secondary | ICD-10-CM | POA: Diagnosis not present

## 2019-11-17 DIAGNOSIS — Z48813 Encounter for surgical aftercare following surgery on the respiratory system: Secondary | ICD-10-CM | POA: Diagnosis not present

## 2019-11-22 ENCOUNTER — Other Ambulatory Visit: Payer: Self-pay

## 2019-11-22 ENCOUNTER — Ambulatory Visit (INDEPENDENT_AMBULATORY_CARE_PROVIDER_SITE_OTHER): Payer: Medicare PPO

## 2019-11-22 DIAGNOSIS — Z23 Encounter for immunization: Secondary | ICD-10-CM

## 2019-11-30 DIAGNOSIS — Z48813 Encounter for surgical aftercare following surgery on the respiratory system: Secondary | ICD-10-CM | POA: Diagnosis not present

## 2019-12-13 DIAGNOSIS — Z48813 Encounter for surgical aftercare following surgery on the respiratory system: Secondary | ICD-10-CM | POA: Diagnosis not present

## 2019-12-14 DIAGNOSIS — H5712 Ocular pain, left eye: Secondary | ICD-10-CM | POA: Diagnosis not present

## 2019-12-31 ENCOUNTER — Telehealth: Payer: Self-pay | Admitting: Family Medicine

## 2019-12-31 DIAGNOSIS — E785 Hyperlipidemia, unspecified: Secondary | ICD-10-CM

## 2020-01-10 DIAGNOSIS — Z48813 Encounter for surgical aftercare following surgery on the respiratory system: Secondary | ICD-10-CM | POA: Diagnosis not present

## 2020-01-11 NOTE — Telephone Encounter (Signed)
[employer] called in regards to this script stating they received this for this patient, however the script is blank. There are no instructions on how to take, daily dosages, or signature. Please advise and call back for clarification at [phone].

## 2020-01-11 NOTE — Telephone Encounter (Signed)
Called [employer]- spoke to pharmacist #[phone] with 1 refill on Rosuvastatin

## 2020-01-17 DIAGNOSIS — G4452 New daily persistent headache (NDPH): Secondary | ICD-10-CM | POA: Diagnosis not present

## 2020-01-17 DIAGNOSIS — R519 Headache, unspecified: Secondary | ICD-10-CM | POA: Diagnosis not present

## 2020-02-10 DIAGNOSIS — J321 Chronic frontal sinusitis: Secondary | ICD-10-CM | POA: Diagnosis not present

## 2020-02-10 DIAGNOSIS — J3489 Other specified disorders of nose and nasal sinuses: Secondary | ICD-10-CM | POA: Diagnosis not present

## 2020-02-10 DIAGNOSIS — Z01818 Encounter for other preprocedural examination: Secondary | ICD-10-CM | POA: Diagnosis not present

## 2020-02-10 DIAGNOSIS — Z77090 Contact with and (suspected) exposure to asbestos: Secondary | ICD-10-CM | POA: Diagnosis not present

## 2020-02-24 ENCOUNTER — Other Ambulatory Visit: Payer: Self-pay | Admitting: Otolaryngology

## 2020-02-24 ENCOUNTER — Other Ambulatory Visit (HOSPITAL_COMMUNITY): Payer: Self-pay | Admitting: Otolaryngology

## 2020-02-24 DIAGNOSIS — J321 Chronic frontal sinusitis: Secondary | ICD-10-CM

## 2020-03-01 ENCOUNTER — Ambulatory Visit
Admission: RE | Admit: 2020-03-01 | Discharge: 2020-03-01 | Disposition: A | Discharge status: Home / Self Care | Payer: Medicare PPO | Source: Ambulatory Visit | Attending: Otolaryngology | Admitting: Otolaryngology

## 2020-03-01 ENCOUNTER — Other Ambulatory Visit: Payer: Self-pay

## 2020-03-01 DIAGNOSIS — J321 Chronic frontal sinusitis: Secondary | ICD-10-CM | POA: Insufficient documentation

## 2020-03-01 DIAGNOSIS — J3489 Other specified disorders of nose and nasal sinuses: Secondary | ICD-10-CM | POA: Diagnosis not present

## 2020-03-01 DIAGNOSIS — J323 Chronic sphenoidal sinusitis: Secondary | ICD-10-CM | POA: Diagnosis not present

## 2020-03-01 DIAGNOSIS — J341 Cyst and mucocele of nose and nasal sinus: Secondary | ICD-10-CM | POA: Diagnosis not present

## 2020-03-01 DIAGNOSIS — J320 Chronic maxillary sinusitis: Secondary | ICD-10-CM | POA: Diagnosis not present

## 2020-03-13 DIAGNOSIS — K116 Mucocele of salivary gland: Secondary | ICD-10-CM | POA: Diagnosis not present

## 2020-03-13 DIAGNOSIS — J323 Chronic sphenoidal sinusitis: Secondary | ICD-10-CM | POA: Diagnosis not present

## 2020-03-13 DIAGNOSIS — J3489 Other specified disorders of nose and nasal sinuses: Secondary | ICD-10-CM | POA: Diagnosis not present

## 2020-04-13 DIAGNOSIS — K116 Mucocele of salivary gland: Secondary | ICD-10-CM | POA: Diagnosis not present

## 2020-04-13 DIAGNOSIS — J323 Chronic sphenoidal sinusitis: Secondary | ICD-10-CM | POA: Diagnosis not present

## 2020-04-17 ENCOUNTER — Other Ambulatory Visit: Payer: Self-pay

## 2020-04-17 ENCOUNTER — Ambulatory Visit (INDEPENDENT_AMBULATORY_CARE_PROVIDER_SITE_OTHER): Payer: Medicare PPO | Admitting: Family Medicine

## 2020-04-17 ENCOUNTER — Encounter: Payer: Self-pay | Admitting: Family Medicine

## 2020-04-17 VITALS — BP 140/84 | HR 87 | Ht 64.0 in | Wt 151.0 lb

## 2020-04-17 DIAGNOSIS — M659 Synovitis and tenosynovitis, unspecified: Secondary | ICD-10-CM | POA: Diagnosis not present

## 2020-04-17 MED ORDER — PREDNISONE 10 MG PO TABS: 10.0000 mg | ORAL_TABLET | Freq: Every day | ORAL | 0 refills | Status: DC

## 2020-04-17 NOTE — Progress Notes (Signed)
Date:  04/17/2020   Name:  Blake Patrick   DOB:  01-Aug-1951   MRN:  [id number]   Chief Complaint: Ankle Pain (Bilateral ankle pain x 3 months. Worse in the morning. More discomfort than pain. Slightly swollen. ) and Headache (Needs to continue to see neurology- Dr Fransisca Kaufmann. Continuing to see Dr Elenore Rota- has another sinus surgery ont he 27th of this month. )  Ankle Pain  The incident occurred more than 1 week ago. There was no injury mechanism. The pain is present in the left foot and right foot. The quality of the pain is described as aching. The pain has been constant since onset. Pertinent negatives include no inability to bear weight, loss of motion, loss of sensation, muscle weakness, numbness or tingling.    Lab Results  Component Value Date   CREATININE 1.21 11/03/2019   BUN 16 11/03/2019   NA 139 11/03/2019   K 4.7 11/03/2019   CL 99 11/03/2019   CO2 24 11/03/2019   Lab Results  Component Value Date   CHOL 177 11/03/2019   HDL 62 11/03/2019   LDLCALC 85 11/03/2019   TRIG 180 (H) 11/03/2019   Lab Results  Component Value Date   TSH 0.69 07/08/2018   Lab Results  Component Value Date   HGBA1C 5.4 04/06/2019   No results found for: WBC, HGB, HCT, MCV, PLT Lab Results  Component Value Date   ALT 31 07/08/2018   AST 18 07/08/2018   ALKPHOS 99 07/08/2018     Review of Systems  Neurological: Negative for tingling and numbness.    Patient Active Problem List   Diagnosis Date Noted  . Migraines 12/21/2017    No Known Allergies  Past Surgical History:  Procedure Laterality Date  . ELBOW SURGERY Right 2007  . ETHMOIDECTOMY Bilateral 10/27/2019   Procedure: TOTAL ETHMOIDECTOMY WITH FRONTAL SINUOTOMY;  Surgeon: Vernie Murders, MD;  Location: [facility];  Service: ENT;  Laterality: Bilateral;  . HAND SURGERY  1983  . HERNIA REPAIR  1995  . IMAGE GUIDED SINUS SURGERY Bilateral 10/27/2019   Procedure: IMAGE GUIDED SINUS SURGERY;  Surgeon: Vernie Murders,  MD;  Location: [facility];  Service: ENT;  Laterality: Bilateral;  need stryker disk placed disk on or charge nurse desk 2-17   kp  . MENISCUS REPAIR Left 2018  . SEPTOPLASTY Bilateral 10/27/2019   Procedure: SEPTOPLASTY REVISION;  Surgeon: Vernie Murders, MD;  Location: [facility];  Service: ENT;  Laterality: Bilateral;  . TONSILLECTOMY  1964    Social History   Tobacco Use  . Smoking status: Former Smoker    Types: Cigarettes    Quit date: 09/01/1988    Years since quitting: 31.6  . Smokeless tobacco: Never Used  Substance Use Topics  . Alcohol use: Yes    Comment: rarely  . Drug use: Never     Medication list has been reviewed and updated.  Current Meds  Medication Sig  . Calcium Carbonate-Vitamin D (RA CALCIUM PLUS VITAMIN D PO) Take 525 mg by mouth 2 (two) times daily.  . Cholecalciferol (VITAMIN D) 50 MCG (2000 UT) CAPS Take 2 capsules by mouth daily.  . CVS TRIPLE MAGNESIUM COMPLEX PO Take 300 mg by mouth 2 (two) times daily.  . Glucosamine-Chondroitin (GLUCOSAMINE CHONDR COMPLEX PO) Take 3,700 mg by mouth daily.  . Magnesium 400 MG CAPS Take 1 capsule by mouth 2 (two) times daily.  . polycarbophil (FIBERCON) 625 MG tablet Take 1,250 mg by  mouth 2 (two) times daily.  . Riboflavin 400 MG TABS Take 1 tablet by mouth daily.  . rosuvastatin (CRESTOR) 10 MG tablet TAKE 1 TABLET BY MOUTH EVERY DAY  . vitamin C (ASCORBIC ACID) 500 MG tablet Take 500 mg by mouth daily.  . Vitamin E 180 MG CAPS Take 1 capsule by mouth daily.  . Vitamins-Lipotropics (LIPO-FLAVONOID PLUS PO) Take 1,000 mg by mouth 2 (two) times daily.  . Zinc 50 MG CAPS Take 1 capsule by mouth daily.    PHQ 2/9 Scores 04/17/2020 11/03/2019 07/07/2019  PHQ - 2 Score 0 0 0  PHQ- 9 Score 0 0 0    GAD 7 : Generalized Anxiety Score 04/17/2020 11/03/2019 07/07/2019  Nervous, Anxious, on Edge 0 0 0  Control/stop worrying 0 0 0  Worry too much - different things 0 0 0  Trouble relaxing 0 0 0  Restless 0  0 0  Easily annoyed or irritable 0 0 0  Afraid - awful might happen 0 0 0  Total GAD 7 Score 0 0 0  Anxiety Difficulty Not difficult at all Not difficult at all -    BP Readings from Last 3 Encounters:  04/17/20 140/84  11/03/19 118/72  10/27/19 118/85    Physical Exam  Wt Readings from Last 3 Encounters:  04/17/20 151 lb (68.5 kg)  11/03/19 158 lb (71.7 kg)  10/27/19 153 lb (69.4 kg)    BP 140/84   Pulse 87   Ht 5\' 4"  (1.626 m)   Wt 151 lb (68.5 kg)   SpO2 98%   BMI 25.92 kg/m   Assessment and Plan: 1. Tenosynovitis of left foot Chronic.  Persistent.  Waxes and wanes in intensity depending on activity.  Exam and history is consistent with a tenosynovitis involving the dorsiflexors of the foot and toes.  Patient has upcoming surgery and patient also has problems with chronic headaches that I do not want to confuse with analgesic rebound with his upcoming appointments.  We will do a short tapering dose of prednisone from 40x2,30x2,20x2 then 1x2 days. - predniSONE (DELTASONE) 10 MG tablet; Take 1 tablet (10 mg total) by mouth daily with breakfast. Taper 4,4,3,3,2,2,1,1  Dispense: 30 tablet; Refill: 0

## 2020-04-17 NOTE — Patient Instructions (Addendum)
Continue to see Dr. Fransisca Kaufmann from Neurology about headaches.  [phone] Tendinitis  Tendinitis is inflammation of a tendon. A tendon is a strong cord of tissue that connects muscle to bone. Tendinitis can affect any tendon, but it most commonly affects the:  Shoulder tendon (rotator cuff).  Ankle tendon (Achilles tendon).  Elbow tendon (triceps tendon).  Tendons in the wrist. What are the causes? This condition may be caused by:  Overusing a tendon or muscle. This is common.  Age-related wear and tear.  Injury.  Inflammatory conditions, such as arthritis.  Certain medicines. What increases the risk? You are more likely to develop this condition if you do activities that involve the same movements over and over again (repetitive motions). What are the signs or symptoms? Symptoms of this condition may include:  Pain.  Tenderness.  Mild swelling.  Decreased range of motion. How is this diagnosed? This condition is diagnosed with a physical exam. You may also have tests, such as:  Ultrasound. This uses sound waves to make an image of the inside of your body in the affected area.  MRI. How is this treated? This condition may be treated by resting, icing, applying pressure (compression), and raising (elevating) the affected area above the level of your heart. This is known as RICE therapy. Treatment may also include:  Medicines to help reduce inflammation or to help reduce pain.  Exercises or physical therapy to strengthen and stretch the tendon.  A brace or splint.  Surgery. This is rarely needed. Follow these instructions at home: If you have a splint or brace:  Wear the splint or brace as told by your health care provider. Remove it only as told by your health care provider.  Loosen the splint or brace if your fingers or toes tingle, become numb, or turn cold and blue.  Keep the splint or brace clean.  If the splint or brace is not waterproof: ? Do  not let it get wet. ? Cover it with a watertight covering when you take a bath or shower. Managing pain, stiffness, and swelling  If directed, put ice on the affected area. ? If you have a removable splint or brace, remove it as told by your health care provider. ? Put ice in a plastic bag. ? Place a towel between your skin and the bag. ? Leave the ice on for 20 minutes, 2-3 times a day.  Move the fingers or toes of the affected limb often, if this applies. This can help to prevent stiffness and lessen swelling.  If directed, raise (elevate) the affected area above the level of your heart while you are sitting or lying down.  If directed, apply heat to the affected area before you exercise. Use the heat source that your health care provider recommends, such as a moist heat pack or a heating pad.     ? Place a towel between your skin and the heat source. ? Leave the heat on for 20-30 minutes. ? Remove the heat if your skin turns bright red. This is especially important if you are unable to feel pain, heat, or cold. You may have a greater risk of getting burned. Driving  Do not drive or use heavy machinery while taking prescription pain medicine.  Ask your health care provider when it is safe to drive if you have a splint or brace on any part of your arm or leg. Activity  Rest the affected area as told by your health care provider.  Return to your normal activities as told by your health care provider. Ask your health care provider what activities are safe for you.  Avoid using the affected area while you are experiencing symptoms of tendinitis.  Do exercises as told by your health care provider. General instructions  If you have a splint, do not put pressure on any part of the splint until it is fully hardened. This may take several hours.  Wear an elastic bandage or compression wrap only as told by your health care provider.  Take over-the-counter and prescription medicines  only as told by your health care provider.  Keep all follow-up visits as told by your health care provider. This is important. Contact a health care provider if:  Your symptoms do not improve.  You develop new, unexplained problems, such as numbness in your hands. Summary  Tendinitis is inflammation of a tendon.  You are more likely to develop this condition if you do activities that involve the same movements over and over again.  This condition may be treated by resting, icing, applying pressure (compression), and elevating the area above the level of your heart. This is known as RICE therapy.  Avoid using the affected area while you are experiencing symptoms of tendinitis. This information is not intended to replace advice given to you by your health care provider. Make sure you discuss any questions you have with your health care provider. Document Revised: 02/23/2018 Document Reviewed: 01/06/2018 Elsevier Patient Education  2020 [employer].

## 2020-04-18 ENCOUNTER — Other Ambulatory Visit: Payer: Self-pay

## 2020-04-18 ENCOUNTER — Encounter: Payer: Self-pay | Admitting: Otolaryngology

## 2020-04-23 NOTE — Discharge Instructions (Signed)
[facility] ENDOSCOPIC SINUS SURGERY [employer] [facility]  What is Functional Endoscopic Sinus Surgery?  The Surgery involves making the natural openings of the sinuses larger by removing the bony partitions that separate the sinuses from the nasal cavity.  The natural sinus lining is preserved as much as possible to allow the sinuses to resume normal function after the surgery.  In some patients nasal polyps (excessively swollen lining of the sinuses) may be removed to relieve obstruction of the sinus openings.  The surgery is performed through the nose using lighted scopes, which eliminates the need for incisions on the face.  A septoplasty is a different procedure which is sometimes performed with sinus surgery.  It involves straightening the boy partition that separates the two sides of your nose.  A crooked or deviated septum may need repair if is obstructing the sinuses or nasal airflow.  Turbinate reduction is also often performed during sinus surgery.  The turbinates are bony proturberances from the side walls of the nose which swell and can obstruct the nose in patients with sinus and allergy problems.  Their size can be surgically reduced to help relieve nasal obstruction.  What Can Sinus Surgery Do For Me?  Sinus surgery can reduce the frequency of sinus infections requiring antibiotic treatment.  This can provide improvement in nasal congestion, post-nasal drainage, facial pressure and nasal obstruction.  Surgery will NOT prevent you from ever having an infection again, so it usually only for patients who get infections 4 or more times yearly requiring antibiotics, or for infections that do not clear with antibiotics.  It will not cure nasal allergies, so patients with allergies may still require medication to treat their allergies after surgery. Surgery may improve headaches related to sinusitis, however, some people will continue to  require medication to control sinus headaches related to allergies.  Surgery will do nothing for other forms of headache (migraine, tension or cluster).  What Are the Risks of Endoscopic Sinus Surgery?  Current techniques allow surgery to be performed safely with little risk, however, there are rare complications that patients should be aware of.  Because the sinuses are located around the eyes, there is risk of eye injury, including blindness, though again, this would be quite rare. This is usually a result of bleeding behind the eye during surgery, which puts the vision oat risk, though there are treatments to protect the vision and prevent permanent disrupted by surgery causing a leak of the spinal fluid that surrounds the brain.  More serious complications would include bleeding inside the brain cavity or damage to the brain.  Again, all of these complications are uncommon, and spinal fluid leaks can be safely managed surgically if they occur.  The most common complication of sinus surgery is bleeding from the nose, which may require packing or cauterization of the nose.  Continued sinus have polyps may experience recurrence of the polyps requiring revision surgery.  Alterations of sense of smell or injury to the tear ducts are also rare complications.   What is the Surgery Like, and what is the Recovery?  The Surgery usually takes a couple of hours to perform, and is usually performed under a general anesthetic (completely asleep).  Patients are usually discharged home after a couple of hours.  Sometimes during surgery it is necessary to pack the nose to control bleeding, and the packing is left in place for 24 - 48 hours, and removed by your surgeon.    If a septoplasty was performed during the procedure, there is often a splint placed which must be removed after 5-7 days.   Discomfort: Pain is usually mild to moderate, and can be controlled by prescription pain medication or acetaminophen (Tylenol).   Aspirin, Ibuprofen (Advil, Motrin), or Naprosyn (Aleve) should be avoided, as they can cause increased bleeding.  Most patients feel sinus pressure like they have a bad head cold for several days.  Sleeping with your head elevated can help reduce swelling and facial pressure, as can ice packs over the face.  A humidifier may be helpful to keep the mucous and blood from drying in the nose.   Diet: There are no specific diet restrictions, however, you should generally start with clear liquids and a light diet of bland foods because the anesthetic can cause some nausea.  Advance your diet depending on how your stomach feels.  Taking your pain medication with food will often help reduce stomach upset which pain medications can cause.  Nasal Saline Irrigation: It is important to remove blood clots and dried mucous from the nose as it is healing.  This is done by having you irrigate the nose at least 3 - 4 times daily with a salt water solution.  We recommend using NeilMed Sinus Rinse (available at the drug store).  Fill the squeeze bottle with the solution, bend over a sink, and insert the tip of the squeeze bottle into the nose  of an inch.  Point the tip of the squeeze bottle towards the inside corner of the eye on the same side your irrigating.  Squeeze the bottle and gently irrigate the nose.  If you bend forward as you do this, most of the fluid will flow back out of the nose, instead of down your throat.   The solution should be warm, near body temperature, when you irrigate.   Each time you irrigate, you should use a full squeeze bottle.   Note that if you are instructed to use Nasal Steroid Sprays at any time after your surgery, irrigate with saline BEFORE using the steroid spray, so you do not wash it all out of the nose. Another product, Nasal Saline Gel (such as AYR Nasal Saline Gel) can be applied in each nostril 3 - 4 times daily to moisture the nose and reduce scabbing or crusting.  Bleeding:   Bloody drainage from the nose can be expected for several days, and patients are instructed to irrigate their nose frequently with salt water to help remove mucous and blood clots.  The drainage may be dark red or brown, though some fresh blood may be seen intermittently, especially after irrigation.  Do not blow you nose, as bleeding may occur. If you must sneeze, keep your mouth open to allow air to escape through your mouth.  If heavy bleeding occurs: Irrigate the nose with saline to rinse out clots, then spray the nose 3 - 4 times with Afrin Nasal Decongestant Spray.  The spray will constrict the blood vessels to slow bleeding.  Pinch the lower half of your nose shut to apply pressure, and lay down with your head elevated.  Ice packs over the nose may help as well. If bleeding persists despite these measures, you should notify your doctor.  Do not use the Afrin routinely to control nasal congestion after surgery, as it can result in worsening congestion and may affect healing.     Activity: Return to work varies among patients. Most patients will be   out of work at least 5 - 7 days to recover.  Patient may return to work after they are off of narcotic pain medication, and feeling well enough to perform the functions of their job.  Patients must avoid heavy lifting (over 10 pounds) or strenuous physical for 2 weeks after surgery, so your employer may need to assign you to light duty, or keep you out of work longer if light duty is not possible.  NOTE: you should not drive, operate dangerous machinery, do any mentally demanding tasks or make any important legal or financial decisions while on narcotic pain medication and recovering from the general anesthetic.    Call Your Doctor Immediately if You Have Any of the Following: 1. Bleeding that you cannot control with the above measures 2. Loss of vision, double vision, bulging of the eye or black eyes. 3. Fever over 101 degrees 4. Neck stiffness with  severe headache, fever, nausea and change in mental state. You are always encourage to call anytime with concerns, however, please call with requests for pain medication refills during office hours.  Office Endoscopy: During follow-up visits your doctor will remove any packing or splints that may have been placed and evaluate and clean your sinuses endoscopically.  Topical anesthetic will be used to make this as comfortable as possible, though you may want to take your pain medication prior to the visit.  How often this will need to be done varies from patient to patient.  After complete recovery from the surgery, you may need follow-up endoscopy from time to time, particularly if there is concern of recurrent infection or nasal polyps.  General Anesthesia, Adult, Care After This sheet gives you information about how to care for yourself after your procedure. Your health care provider may also give you more specific instructions. If you have problems or questions, contact your health care provider. What can I expect after the procedure? After the procedure, the following side effects are common:  Pain or discomfort at the IV site.  Nausea.  Vomiting.  Sore throat.  Trouble concentrating.  Feeling cold or chills.  Weak or tired.  Sleepiness and fatigue.  Soreness and body aches. These side effects can affect parts of the body that were not involved in surgery. Follow these instructions at home:  For at least 24 hours after the procedure:  Have a responsible adult stay with you. It is important to have someone help care for you until you are awake and alert.  Rest as needed.  Do not: ? Participate in activities in which you could fall or become injured. ? Drive. ? Use heavy machinery. ? Drink alcohol. ? Take sleeping pills or medicines that cause drowsiness. ? Make important decisions or sign legal documents. ? Take care of children on your own. Eating and drinking  Follow  any instructions from your health care provider about eating or drinking restrictions.  When you feel hungry, start by eating small amounts of foods that are soft and easy to digest (bland), such as toast. Gradually return to your regular diet.  Drink enough fluid to keep your urine pale yellow.  If you vomit, rehydrate by drinking water, juice, or clear broth. General instructions  If you have sleep apnea, surgery and certain medicines can increase your risk for breathing problems. Follow instructions from your health care provider about wearing your sleep device: ? Anytime you are sleeping, including during daytime naps. ? While taking prescription pain medicines, sleeping medicines, or medicines that   make you drowsy.  Return to your normal activities as told by your health care provider. Ask your health care provider what activities are safe for you.  Take over-the-counter and prescription medicines only as told by your health care provider.  If you smoke, do not smoke without supervision.  Keep all follow-up visits as told by your health care provider. This is important. Contact a health care provider if:  You have nausea or vomiting that does not get better with medicine.  You cannot eat or drink without vomiting.  You have pain that does not get better with medicine.  You are unable to pass urine.  You develop a skin rash.  You have a fever.  You have redness around your IV site that gets worse. Get help right away if:  You have difficulty breathing.  You have chest pain.  You have blood in your urine or stool, or you vomit blood. Summary  After the procedure, it is common to have a sore throat or nausea. It is also common to feel tired.  Have a responsible adult stay with you for the first 24 hours after general anesthesia. It is important to have someone help care for you until you are awake and alert.  When you feel hungry, start by eating small amounts of  foods that are soft and easy to digest (bland), such as toast. Gradually return to your regular diet.  Drink enough fluid to keep your urine pale yellow.  Return to your normal activities as told by your health care provider. Ask your health care provider what activities are safe for you. This information is not intended to replace advice given to you by your health care provider. Make sure you discuss any questions you have with your health care provider. Document Revised: 08/21/2017 Document Reviewed: 04/03/2017 Elsevier Patient Education  2020 [employer]  

## 2020-04-24 ENCOUNTER — Other Ambulatory Visit: Payer: Self-pay

## 2020-04-24 ENCOUNTER — Other Ambulatory Visit
Admission: RE | Admit: 2020-04-24 | Discharge: 2020-04-24 | Disposition: A | Discharge status: Home / Self Care | Payer: Medicare PPO | Source: Ambulatory Visit | Attending: Otolaryngology | Admitting: Otolaryngology

## 2020-04-24 DIAGNOSIS — Z20822 Contact with and (suspected) exposure to covid-19: Secondary | ICD-10-CM | POA: Diagnosis not present

## 2020-04-24 DIAGNOSIS — Z01812 Encounter for preprocedural laboratory examination: Secondary | ICD-10-CM | POA: Insufficient documentation

## 2020-04-25 LAB — SARS CORONAVIRUS 2 (TAT 6-24 HRS): SARS Coronavirus 2: NEGATIVE

## 2020-04-25 NOTE — Anesthesia Preprocedure Evaluation (Addendum)
Anesthesia Evaluation  Patient identified by MRN, date of birth, ID band Patient awake    Reviewed: NPO status   History of Anesthesia Complications Negative for: history of anesthetic complications  Airway Mallampati: II  TM Distance: >3 FB Neck ROM: full    Dental no notable dental hx.    Pulmonary asthma (mild) , former smoker,    Pulmonary exam normal        Cardiovascular Exercise Tolerance: Good negative cardio ROS Normal cardiovascular exam     Neuro/Psych  Headaches, negative psych ROS   GI/Hepatic Neg liver ROS, GERD  Controlled,  Endo/Other  negative endocrine ROS  Renal/GU negative Renal ROS  negative genitourinary   Musculoskeletal  (+) Arthritis ,   Abdominal   Peds  Hematology negative hematology ROS (+)   Anesthesia Other Findings pft 01/2020: Mild obstructive ventilatory defect with impressive 15% FEV1 reversibility with BD therapy and supranormal DLCO suggestive of asthma.  Georga Hacking, MD Division of Pulmonary & Critical Care Medicine ;  Had sinus surgery on 10/2019 at [facility].  Covid: NEG.  Last prednisone: 8/24.  Reproductive/Obstetrics                            Anesthesia Physical Anesthesia Plan  ASA: II  Anesthesia Plan: General ETT   Post-op Pain Management:    Induction:   PONV Risk Score and Plan: 2 and Midazolam and Ondansetron  Airway Management Planned:   Additional Equipment:   Intra-op Plan:   Post-operative Plan:   Informed Consent: I have reviewed the patients History and Physical, chart, labs and discussed the procedure including the risks, benefits and alternatives for the proposed anesthesia with the patient or authorized representative who has indicated his/her understanding and acceptance.       Plan Discussed with: CRNA  Anesthesia Plan Comments:         Anesthesia Quick Evaluation

## 2020-04-26 ENCOUNTER — Ambulatory Visit
Admission: RE | Admit: 2020-04-26 | Discharge: 2020-04-26 | Disposition: A | Discharge status: Home / Self Care | Payer: Medicare PPO | Attending: Otolaryngology | Admitting: Otolaryngology

## 2020-04-26 ENCOUNTER — Encounter: Payer: Self-pay | Admitting: Otolaryngology

## 2020-04-26 ENCOUNTER — Ambulatory Visit: Payer: Medicare PPO | Admitting: Anesthesiology

## 2020-04-26 ENCOUNTER — Other Ambulatory Visit: Payer: Self-pay

## 2020-04-26 ENCOUNTER — Encounter
Admission: RE | Disposition: A | Discharge status: Home / Self Care | Payer: Self-pay | Source: Home / Self Care | Attending: Otolaryngology

## 2020-04-26 DIAGNOSIS — J45909 Unspecified asthma, uncomplicated: Secondary | ICD-10-CM | POA: Insufficient documentation

## 2020-04-26 DIAGNOSIS — Z791 Long term (current) use of non-steroidal anti-inflammatories (NSAID): Secondary | ICD-10-CM | POA: Insufficient documentation

## 2020-04-26 DIAGNOSIS — Z87891 Personal history of nicotine dependence: Secondary | ICD-10-CM | POA: Insufficient documentation

## 2020-04-26 DIAGNOSIS — J320 Chronic maxillary sinusitis: Secondary | ICD-10-CM | POA: Diagnosis not present

## 2020-04-26 DIAGNOSIS — Z79899 Other long term (current) drug therapy: Secondary | ICD-10-CM | POA: Insufficient documentation

## 2020-04-26 DIAGNOSIS — Z7951 Long term (current) use of inhaled steroids: Secondary | ICD-10-CM | POA: Diagnosis not present

## 2020-04-26 DIAGNOSIS — M1990 Unspecified osteoarthritis, unspecified site: Secondary | ICD-10-CM | POA: Insufficient documentation

## 2020-04-26 DIAGNOSIS — J323 Chronic sphenoidal sinusitis: Secondary | ICD-10-CM | POA: Diagnosis not present

## 2020-04-26 DIAGNOSIS — J341 Cyst and mucocele of nose and nasal sinus: Secondary | ICD-10-CM | POA: Insufficient documentation

## 2020-04-26 DIAGNOSIS — J328 Other chronic sinusitis: Secondary | ICD-10-CM | POA: Diagnosis not present

## 2020-04-26 DIAGNOSIS — J329 Chronic sinusitis, unspecified: Secondary | ICD-10-CM | POA: Diagnosis not present

## 2020-04-26 HISTORY — PX: IMAGE GUIDED SINUS SURGERY: SHX6570

## 2020-04-26 HISTORY — DX: Chronic sinusitis, unspecified: J32.9

## 2020-04-26 HISTORY — DX: Enthesopathy, unspecified: M77.9

## 2020-04-26 HISTORY — PX: SPHENOIDECTOMY: SHX2421

## 2020-04-26 SURGERY — SINUS SURGERY, WITH IMAGING GUIDANCE
Anesthesia: General | Site: Nose | Laterality: Right

## 2020-04-26 MED ORDER — DEXAMETHASONE SODIUM PHOSPHATE 4 MG/ML IJ SOLN
INTRAMUSCULAR | Status: DC | PRN
  Administered 2020-04-26: 10 mg via INTRAVENOUS

## 2020-04-26 MED ORDER — LACTATED RINGERS IV SOLN: INTRAVENOUS | Status: DC

## 2020-04-26 MED ORDER — MIDAZOLAM HCL 5 MG/5ML IJ SOLN
INTRAMUSCULAR | Status: DC | PRN
  Administered 2020-04-26: 2 mg via INTRAVENOUS

## 2020-04-26 MED ORDER — ONDANSETRON HCL 4 MG/2ML IJ SOLN
INTRAMUSCULAR | Status: DC | PRN
  Administered 2020-04-26: 4 mg via INTRAVENOUS

## 2020-04-26 MED ORDER — PREDNISONE 10 MG PO TABS: ORAL_TABLET | ORAL | 0 refills | Status: DC

## 2020-04-26 MED ORDER — LIDOCAINE HCL (CARDIAC) PF 100 MG/5ML IV SOSY
PREFILLED_SYRINGE | INTRAVENOUS | Status: DC | PRN
  Administered 2020-04-26: 50 mg via INTRAVENOUS

## 2020-04-26 MED ORDER — OXYCODONE HCL 5 MG PO TABS
5.0000 mg | ORAL_TABLET | Freq: Once | ORAL | Status: AC | PRN
  Administered 2020-04-26: 5 mg via ORAL

## 2020-04-26 MED ORDER — PROPOFOL 10 MG/ML IV BOLUS
INTRAVENOUS | Status: DC | PRN
  Administered 2020-04-26: 140 mg via INTRAVENOUS

## 2020-04-26 MED ORDER — GLYCOPYRROLATE 0.2 MG/ML IJ SOLN
INTRAMUSCULAR | Status: DC | PRN
  Administered 2020-04-26: .1 mg via INTRAVENOUS

## 2020-04-26 MED ORDER — PHENYLEPHRINE HCL 0.5 % NA SOLN
NASAL | Status: DC | PRN
  Administered 2020-04-26: 15 mL via TOPICAL

## 2020-04-26 MED ORDER — CEPHALEXIN 500 MG PO CAPS: 500.0000 mg | ORAL_CAPSULE | Freq: Two times a day (BID) | ORAL | 0 refills | Status: DC

## 2020-04-26 MED ORDER — LIDOCAINE-EPINEPHRINE 1 %-1:100000 IJ SOLN
INTRAMUSCULAR | Status: DC | PRN
  Administered 2020-04-26: 3 mL

## 2020-04-26 MED ORDER — OXYMETAZOLINE HCL 0.05 % NA SOLN
2.0000 | Freq: Once | NASAL | Status: AC
  Administered 2020-04-26: 2 via NASAL

## 2020-04-26 MED ORDER — HYDROCODONE-ACETAMINOPHEN 5-325 MG PO TABS
1.0000 | ORAL_TABLET | Freq: Four times a day (QID) | ORAL | 0 refills | Status: AC | PRN
Start: 2020-04-26 — End: 2020-05-03

## 2020-04-26 MED ORDER — EPHEDRINE SULFATE 50 MG/ML IJ SOLN
INTRAMUSCULAR | Status: DC | PRN
  Administered 2020-04-26: 5 mg via INTRAVENOUS
  Administered 2020-04-26: 10 mg via INTRAVENOUS

## 2020-04-26 MED ORDER — OXYCODONE HCL 5 MG/5ML PO SOLN: 5.0000 mg | Freq: Once | ORAL | Status: AC | PRN

## 2020-04-26 MED ORDER — DEXTROSE 5 % IV SOLN
2000.0000 mg | Freq: Once | INTRAVENOUS | Status: AC
  Administered 2020-04-26: 2000 mg via INTRAVENOUS

## 2020-04-26 MED ORDER — SUCCINYLCHOLINE CHLORIDE 20 MG/ML IJ SOLN
INTRAMUSCULAR | Status: DC | PRN
  Administered 2020-04-26: 80 mg via INTRAVENOUS

## 2020-04-26 MED ORDER — FENTANYL CITRATE (PF) 100 MCG/2ML IJ SOLN
INTRAMUSCULAR | Status: DC | PRN
  Administered 2020-04-26: 50 ug via INTRAVENOUS

## 2020-04-26 SURGICAL SUPPLY — 33 items
BATTERY INSTRU NAVIGATION (MISCELLANEOUS) ×12 IMPLANT
BTRY SRG DRVR LF (MISCELLANEOUS) ×6
CANISTER SUCT 1200ML W/VALVE (MISCELLANEOUS) ×4 IMPLANT
CATH IV 18X1 1/4 SAFELET (CATHETERS) ×4 IMPLANT
COAGULATOR SUCT 8FR VV (MISCELLANEOUS) ×4 IMPLANT
ELECT REM PT RETURN 9FT ADLT (ELECTROSURGICAL) ×4
ELECTRODE REM PT RTRN 9FT ADLT (ELECTROSURGICAL) ×2 IMPLANT
GLOVE PI ULTRA LF STRL 7.5 (GLOVE) ×4 IMPLANT
GLOVE PI ULTRA NON LATEX 7.5 (GLOVE) ×4
GOWN STRL REUS W/ TWL LRG LVL3 (GOWN DISPOSABLE) ×2 IMPLANT
GOWN STRL REUS W/TWL LRG LVL3 (GOWN DISPOSABLE) ×4
IV CATH 18X1 1/4 SAFELET (CATHETERS) ×2
IV NS 500ML (IV SOLUTION) ×4
IV NS 500ML BAXH (IV SOLUTION) ×2 IMPLANT
KIT TURNOVER KIT A (KITS) ×4 IMPLANT
NDL ANESTHESIA 27G X 3.5 (NEEDLE) ×2 IMPLANT
NDL HYPO 27GX1-1/4 (NEEDLE) ×2 IMPLANT
NEEDLE ANESTHESIA  27G X 3.5 (NEEDLE) ×2
NEEDLE ANESTHESIA 27G X 3.5 (NEEDLE) ×2 IMPLANT
NEEDLE HYPO 27GX1-1/4 (NEEDLE) ×4 IMPLANT
NS IRRIG 500ML POUR BTL (IV SOLUTION) ×4 IMPLANT
PACK ENT CUSTOM (PACKS) ×4 IMPLANT
PACKING NASAL EPIS 4X2.4 XEROG (MISCELLANEOUS) ×6 IMPLANT
PATTIES SURGICAL .5 X3 (DISPOSABLE) ×4 IMPLANT
SHAVER DIEGO BLD STD TYPE A (BLADE) ×2 IMPLANT
SOL ANTI-FOG 6CC FOG-OUT (MISCELLANEOUS) ×2 IMPLANT
SOL FOG-OUT ANTI-FOG 6CC (MISCELLANEOUS) ×2
STRAP BODY AND KNEE 60X3 (MISCELLANEOUS) ×4 IMPLANT
SYR 3ML LL SCALE MARK (SYRINGE) ×4 IMPLANT
TOWEL OR 17X26 4PK STRL BLUE (TOWEL DISPOSABLE) ×4 IMPLANT
TRACKER CRANIALMASK (MASK) ×4 IMPLANT
TUBING DECLOG MULTIDEBRIDER (TUBING) ×4 IMPLANT
WATER STERILE IRR 250ML POUR (IV SOLUTION) ×4 IMPLANT

## 2020-04-26 NOTE — H&P (Signed)
H&P has been reviewed and patient reevaluated, no changes necessary. To be downloaded later.  

## 2020-04-26 NOTE — Transfer of Care (Signed)
Immediate Anesthesia Transfer of Care Note  Patient: [name]  Procedure(s) Performed: IMAGE GUIDED SINUS SURGERY (Bilateral Nose) SPHENOIDECTOMY (Right Nose)  Patient Location: PACU  Anesthesia Type: General ETT  Level of Consciousness: awake, alert  and patient cooperative  Airway and Oxygen Therapy: Patient Spontanous Breathing and Patient connected to supplemental oxygen  Post-op Assessment: Post-op Vital signs reviewed, Patient's Cardiovascular Status Stable, Respiratory Function Stable, Patent Airway and No signs of Nausea or vomiting  Post-op Vital Signs: Reviewed and stable  Complications: No complications documented.

## 2020-04-26 NOTE — Op Note (Signed)
04/26/2020  8:24 AM    Blake Patrick  [id number]   Pre-Op Dx: Chronic right sphenoid sinusitis, cyst superior wall right maxillary sinus  Post-op Dx: Same  Proc: Right endoscopic sphenoid sinusotomy with removal of contents, unroofing cyst of right superior maxillary sinus wall, use of image guided system  Surg:  Beverly Sessions Rhaya Coale  Anes:  GOT  EBL: 30 mL  Comp: None  Findings: There was thick creamy white-yellow mucus that was coming out of the right sphenoid sinus and filling it.  Once the sinus was widely opened this was all cleaned out and flushed to make sure it was all gone.  The bump on the right maxillary sinus superior wall was a small cyst that was open and suctioned clear.   Procedure: The Blake Patrick was brought to the operating room and placed in a supine position.  Blake Patrick was given general anesthesia by oral endotracheal intubation.  Once Blake Patrick was asleep his nose was prepped with cotton pledgets soaked in phenylephrine and Xylocaine as per usual.  The image guided system was brought in then and the CT scan was downloaded from the disc to the system.  The template was applied the face and this was registered to the system as well.  There was 0.9 mm of variance.  The suction instruments were registered to the system and these showed good alignment with the system.  Blake Patrick was prepped and draped sterile fashion.  After the cotton pledgets were removed the 0 degree scope was used to visualize both sides.  1% Xylocaine with epi 1-100,000 was used for infiltration in the tissues around the opening to the sphenoid sinus and the posterior middle turbinate.  Approximately 1-1/2 mL was placed in the tissues.  Suction was used to clean up some of the mucus that was coming from the ostium of the sphenoid sinus.  Using a small sphenoid punch the opening of the sphenoid sinus was opened and widened inferiorly.  There is thick mucus that was sitting in the sinus.  The opening was widened further until  the anterior face of the sphenoid sinus was removed.  There was some scarring from the superior turbinate towards the septum that was blocking the upper portion of the opening.  The superior turbinate was trimmed slightly along its medial inferior border to open up this area more.  There is no significant bleeding in the area or from the sphenoid sinus.  The 0 and 30 degree scopes can be put directly inside it and there is little bit of mucus still inferiorly and to the medial wall.  This was all suctioned and flushed out.  Xerogel was then placed into the opening of the sphenoid sinus and placed between the superior turbinate and septum.  This covered over the raw surfaces.  There is no significant bleeding here.    The frontal sinus duct was wide open and there is no inflammation in the ethmoids.  The right maxillary sinus showed a small cyst in the midportion of the superior wall of the maxillary sinus.  Using a 90 degree forcep this cyst was open.  There is some thick mucoid drainage but no sign of inflammation.  This was suctioned clear and the cyst was marsupialized.  There is minimal bleeding from this and no packing or gel was placed into the maxillary sinus.  Everything else looked open and clear.  The left side was visualized and there is no inflammation or swelling anywhere and no signs of obstruction to  the sinuses.  Blake Patrick tolerated the procedure well.  Blake Patrick was awakened taken to the recovery room in satisfactory condition.  There were no operative complications.  Dispo:   To PACU to be discharged home  Plan: To rest at home with head elevated.  Will start saline flushes tomorrow.  We will see him back in a week in the office to revisualize the area and make sure things are healing well.  Blake Patrick has medications already sent in including pain medications, prednisone, and Keflex.  Blake Patrick will let me know if Blake Patrick has any problems.  Beverly Sessions Wilfred Siverson  04/26/2020 8:24 AM

## 2020-04-26 NOTE — Anesthesia Postprocedure Evaluation (Signed)
Anesthesia Post Note  Patient: Blake Patrick  Procedure(s) Performed: IMAGE GUIDED SINUS SURGERY (Bilateral Nose) SPHENOIDECTOMY (Right Nose)     Patient location during evaluation: PACU Anesthesia Type: General Level of consciousness: awake and alert Pain management: pain level controlled Vital Signs Assessment: post-procedure vital signs reviewed and stable Respiratory status: spontaneous breathing, nonlabored ventilation, respiratory function stable and patient connected to nasal cannula oxygen Cardiovascular status: blood pressure returned to baseline and stable Postop Assessment: no apparent nausea or vomiting Anesthetic complications: no   No complications documented.  Orrin Brigham

## 2020-04-26 NOTE — Anesthesia Procedure Notes (Signed)
Procedure Name: Intubation Date/Time: 04/26/2020 7:42 AM Performed by: Jimmy Picket, CRNA Pre-anesthesia Checklist: Patient identified, Emergency Drugs available, Suction available, Patient being monitored and Timeout performed Patient Re-evaluated:Patient Re-evaluated prior to induction Oxygen Delivery Method: Circle system utilized Preoxygenation: Pre-oxygenation with 100% oxygen Induction Type: IV induction Ventilation: Mask ventilation without difficulty Laryngoscope Size: Glidescope Grade View: Grade I Tube type: Oral Rae Tube size: 7.5 mm Number of attempts: 1 Airway Equipment and Method: Bougie stylet and Video-laryngoscopy Placement Confirmation: ETT inserted through vocal cords under direct vision,  positive ETCO2 and breath sounds checked- equal and bilateral Tube secured with: Tape Dental Injury: Teeth and Oropharynx as per pre-operative assessment  Difficulty Due To: Difficult Airway- due to anterior larynx Comments: Unable to View cords on 1st DL with Miller 3. Glidescope x 1 with grade I view. Boujie passed through cords without difficulty. ETT passed over boujie. BBS+/=. Tolerated well by pt.

## 2020-04-27 ENCOUNTER — Encounter: Payer: Self-pay | Admitting: Otolaryngology

## 2020-04-30 LAB — SURGICAL PATHOLOGY

## 2020-05-01 DIAGNOSIS — G4452 New daily persistent headache (NDPH): Secondary | ICD-10-CM | POA: Diagnosis not present

## 2020-05-01 DIAGNOSIS — J323 Chronic sphenoidal sinusitis: Secondary | ICD-10-CM | POA: Diagnosis not present

## 2020-05-01 DIAGNOSIS — J301 Allergic rhinitis due to pollen: Secondary | ICD-10-CM | POA: Diagnosis not present

## 2020-05-01 DIAGNOSIS — G43711 Chronic migraine without aura, intractable, with status migrainosus: Secondary | ICD-10-CM | POA: Diagnosis not present

## 2020-05-09 DIAGNOSIS — J323 Chronic sphenoidal sinusitis: Secondary | ICD-10-CM | POA: Diagnosis not present

## 2020-05-09 DIAGNOSIS — J301 Allergic rhinitis due to pollen: Secondary | ICD-10-CM | POA: Diagnosis not present

## 2020-05-17 ENCOUNTER — Other Ambulatory Visit
Admission: RE | Admit: 2020-05-17 | Discharge: 2020-05-17 | Disposition: A | Discharge status: Home / Self Care | Payer: Medicare PPO | Source: Ambulatory Visit | Attending: Pulmonary Disease | Admitting: Pulmonary Disease

## 2020-05-17 DIAGNOSIS — R0600 Dyspnea, unspecified: Secondary | ICD-10-CM | POA: Insufficient documentation

## 2020-05-17 LAB — FIBRIN DERIVATIVES D-DIMER (ARMC ONLY): Fibrin derivatives D-dimer (ARMC): 302.95 ng/mL (FEU) (ref 0.00–499.00)

## 2020-06-02 ENCOUNTER — Other Ambulatory Visit: Payer: Self-pay | Admitting: Family Medicine

## 2020-06-02 DIAGNOSIS — E785 Hyperlipidemia, unspecified: Secondary | ICD-10-CM

## 2020-06-02 NOTE — Telephone Encounter (Signed)
Change of pharmacy Requested Prescriptions  Pending Prescriptions Disp Refills  . rosuvastatin (CRESTOR) 10 MG tablet [Pharmacy Med Name: ROSUVASTATIN CALCIUM 10 MG Tablet] 90 tablet 1    Sig: TAKE 1 TABLET EVERY DAY     Cardiovascular:  Antilipid - Statins Failed - 06/02/2020  3:36 PM      Failed - LDL in normal range and within 360 days    LDL Chol Calc (NIH)  Date Value Ref Range Status  11/03/2019 85 0 - 99 mg/dL Final         Failed - Triglycerides in normal range and within 360 days    Triglycerides  Date Value Ref Range Status  11/03/2019 180 (H) 0 - 149 mg/dL Final         Passed - Total Cholesterol in normal range and within 360 days    Cholesterol, Total  Date Value Ref Range Status  11/03/2019 177 100 - 199 mg/dL Final         Passed - HDL in normal range and within 360 days    HDL  Date Value Ref Range Status  11/03/2019 62 >39 mg/dL Final         Passed - Patient is not pregnant      Passed - Valid encounter within last 12 months    Recent Outpatient Visits          1 month ago Tenosynovitis of left foot   [facility] Duanne Limerick, MD   7 months ago Encounter for annual physical exam   [facility] Duanne Limerick, MD   11 months ago Establishing care with new doctor, encounter for   [facility] Duanne Limerick, MD

## 2020-06-05 DIAGNOSIS — G43711 Chronic migraine without aura, intractable, with status migrainosus: Secondary | ICD-10-CM | POA: Diagnosis not present

## 2020-06-07 DIAGNOSIS — J301 Allergic rhinitis due to pollen: Secondary | ICD-10-CM | POA: Diagnosis not present

## 2020-06-07 DIAGNOSIS — T7840XA Allergy, unspecified, initial encounter: Secondary | ICD-10-CM

## 2020-06-07 HISTORY — DX: Allergy, unspecified, initial encounter: T78.40XA

## 2020-06-08 DIAGNOSIS — J301 Allergic rhinitis due to pollen: Secondary | ICD-10-CM | POA: Diagnosis not present

## 2020-06-08 DIAGNOSIS — J323 Chronic sphenoidal sinusitis: Secondary | ICD-10-CM | POA: Diagnosis not present

## 2020-06-13 DIAGNOSIS — J301 Allergic rhinitis due to pollen: Secondary | ICD-10-CM | POA: Diagnosis not present

## 2020-06-14 ENCOUNTER — Ambulatory Visit (INDEPENDENT_AMBULATORY_CARE_PROVIDER_SITE_OTHER): Payer: Medicare PPO

## 2020-06-14 ENCOUNTER — Other Ambulatory Visit: Payer: Self-pay

## 2020-06-14 DIAGNOSIS — Z23 Encounter for immunization: Secondary | ICD-10-CM

## 2020-06-19 DIAGNOSIS — J301 Allergic rhinitis due to pollen: Secondary | ICD-10-CM | POA: Diagnosis not present

## 2020-06-20 ENCOUNTER — Ambulatory Visit (INDEPENDENT_AMBULATORY_CARE_PROVIDER_SITE_OTHER): Payer: Medicare PPO

## 2020-06-20 DIAGNOSIS — Z1211 Encounter for screening for malignant neoplasm of colon: Secondary | ICD-10-CM | POA: Diagnosis not present

## 2020-06-20 DIAGNOSIS — Z0000 Encounter for general adult medical examination without abnormal findings: Secondary | ICD-10-CM

## 2020-06-20 NOTE — Patient Instructions (Signed)
[name] , Thank you for taking time to come for your Medicare Wellness Visit. I appreciate your ongoing commitment to your health goals. Please review the following plan we discussed and let me know if I can assist you in the future.   Screening recommendations/referrals: Colonoscopy: done 09/13/14. Referral sent to [facility] today. They will contact you for an appointment.  Recommended yearly ophthalmology/optometry visit for glaucoma screening and checkup Recommended yearly dental visit for hygiene and checkup  Vaccinations: Influenza vaccine: done 06/14/20 Pneumococcal vaccine: done 11/22/19 Tdap vaccine: due Shingles vaccine: please bring a record of this vaccine if available   Covid-19: done 10/07/19 & 11/01/19  Advanced directives: Please bring a copy of your health care power of attorney and living will to the office at your convenience.  Conditions/risks identified: Recommend increasing physical activity to at least 3 days per week  Next appointment: Follow up in one year for your annual wellness visit.   Preventive Care 95 Years and Older, Male Preventive care refers to lifestyle choices and visits with your health care provider that can promote health and wellness. What does preventive care include?  A yearly physical exam. This is also called an annual well check.  Dental exams once or twice a year.  Routine eye exams. Ask your health care provider how often you should have your eyes checked.  Personal lifestyle choices, including:  Daily care of your teeth and gums.  Regular physical activity.  Eating a healthy diet.  Avoiding tobacco and drug use.  Limiting alcohol use.  Practicing safe sex.  Taking low doses of aspirin every day.  Taking vitamin and mineral supplements as recommended by your health care provider. What happens during an annual well check? The services and screenings done by your health care provider during your annual well  check will depend on your age, overall health, lifestyle risk factors, and family history of disease. Counseling  Your health care provider may ask you questions about your:  Alcohol use.  Tobacco use.  Drug use.  Emotional well-being.  Home and relationship well-being.  Sexual activity.  Eating habits.  History of falls.  Memory and ability to understand (cognition).  Work and work Astronomer. Screening  You may have the following tests or measurements:  Height, weight, and BMI.  Blood pressure.  Lipid and cholesterol levels. These may be checked every 5 years, or more frequently if you are over 67 years old.  Skin check.  Lung cancer screening. You may have this screening every year starting at age 42 if you have a 30-pack-year history of smoking and currently smoke or have quit within the past 15 years.  Fecal occult blood test (FOBT) of the stool. You may have this test every year starting at age 44.  Flexible sigmoidoscopy or colonoscopy. You may have a sigmoidoscopy every 5 years or a colonoscopy every 10 years starting at age 71.  Prostate cancer screening. Recommendations will vary depending on your family history and other risks.  Hepatitis C blood test.  Hepatitis B blood test.  Sexually transmitted disease (STD) testing.  Diabetes screening. This is done by checking your blood sugar (glucose) after you have not eaten for a while (fasting). You may have this done every 1-3 years.  Abdominal aortic aneurysm (AAA) screening. You may need this if you are a current or former smoker.  Osteoporosis. You may be screened starting at age 51 if you are at high risk. Talk with your health care provider about  your test results, treatment options, and if necessary, the need for more tests. Vaccines  Your health care provider may recommend certain vaccines, such as:  Influenza vaccine. This is recommended every year.  Tetanus, diphtheria, and acellular  pertussis (Tdap, Td) vaccine. You may need a Td booster every 10 years.  Zoster vaccine. You may need this after age 66.  Pneumococcal 13-valent conjugate (PCV13) vaccine. One dose is recommended after age 50.  Pneumococcal polysaccharide (PPSV23) vaccine. One dose is recommended after age 19. Talk to your health care provider about which screenings and vaccines you need and how often you need them. This information is not intended to replace advice given to you by your health care provider. Make sure you discuss any questions you have with your health care provider. Document Released: 09/14/2015 Document Revised: 05/07/2016 Document Reviewed: 06/19/2015 Elsevier Interactive Patient Education  2017 [place] Prevention in the Home Falls can cause injuries. They can happen to people of all ages. There are many things you can do to make your home safe and to help prevent falls. What can I do on the outside of my home?  Regularly fix the edges of walkways and driveways and fix any cracks.  Remove anything that might make you trip as you walk through a door, such as a raised step or threshold.  Trim any bushes or trees on the path to your home.  Use bright outdoor lighting.  Clear any walking paths of anything that might make someone trip, such as rocks or tools.  Regularly check to see if handrails are loose or broken. Make sure that both sides of any steps have handrails.  Any raised decks and porches should have guardrails on the edges.  Have any leaves, snow, or ice cleared regularly.  Use sand or salt on walking paths during winter.  Clean up any spills in your garage right away. This includes oil or grease spills. What can I do in the bathroom?  Use night lights.  Install grab bars by the toilet and in the tub and shower. Do not use towel bars as grab bars.  Use non-skid mats or decals in the tub or shower.  If you need to sit down in the shower, use a plastic,  non-slip stool.  Keep the floor dry. Clean up any water that spills on the floor as soon as it happens.  Remove soap buildup in the tub or shower regularly.  Attach bath mats securely with double-sided non-slip rug tape.  Do not have throw rugs and other things on the floor that can make you trip. What can I do in the bedroom?  Use night lights.  Make sure that you have a light by your bed that is easy to reach.  Do not use any sheets or blankets that are too big for your bed. They should not hang down onto the floor.  Have a firm chair that has side arms. You can use this for support while you get dressed.  Do not have throw rugs and other things on the floor that can make you trip. What can I do in the kitchen?  Clean up any spills right away.  Avoid walking on wet floors.  Keep items that you use a lot in easy-to-reach places.  If you need to reach something above you, use a strong step stool that has a grab bar.  Keep electrical cords out of the way.  Do not use floor polish or wax  that makes floors slippery. If you must use wax, use non-skid floor wax.  Do not have throw rugs and other things on the floor that can make you trip. What can I do with my stairs?  Do not leave any items on the stairs.  Make sure that there are handrails on both sides of the stairs and use them. Fix handrails that are broken or loose. Make sure that handrails are as long as the stairways.  Check any carpeting to make sure that it is firmly attached to the stairs. Fix any carpet that is loose or worn.  Avoid having throw rugs at the top or bottom of the stairs. If you do have throw rugs, attach them to the floor with carpet tape.  Make sure that you have a light switch at the top of the stairs and the bottom of the stairs. If you do not have them, ask someone to add them for you. What else can I do to help prevent falls?  Wear shoes that:  Do not have high heels.  Have rubber  bottoms.  Are comfortable and fit you well.  Are closed at the toe. Do not wear sandals.  If you use a stepladder:  Make sure that it is fully opened. Do not climb a closed stepladder.  Make sure that both sides of the stepladder are locked into place.  Ask someone to hold it for you, if possible.  Clearly mark and make sure that you can see:  Any grab bars or handrails.  First and last steps.  Where the edge of each step is.  Use tools that help you move around (mobility aids) if they are needed. These include:  Canes.  Walkers.  Scooters.  Crutches.  Turn on the lights when you go into a dark area. Replace any light bulbs as soon as they burn out.  Set up your furniture so you have a clear path. Avoid moving your furniture around.  If any of your floors are uneven, fix them.  If there are any pets around you, be aware of where they are.  Review your medicines with your doctor. Some medicines can make you feel dizzy. This can increase your chance of falling. Ask your doctor what other things that you can do to help prevent falls. This information is not intended to replace advice given to you by your health care provider. Make sure you discuss any questions you have with your health care provider. Document Released: 06/14/2009 Document Revised: 01/24/2016 Document Reviewed: 09/22/2014 Elsevier Interactive Patient Education  2017 [employer].

## 2020-06-20 NOTE — Progress Notes (Signed)
Subjective:   Blake Patrick is a 69 y.o. male who presents for an Initial Medicare Annual Wellness Visit.  Virtual Visit via Telephone Note  I connected with  Blake Patrick on 06/20/20 at  8:40 AM EDT by telephone and verified that I am speaking with the correct person using two identifiers.  Medicare Annual Wellness visit completed telephonically due to Covid-19 pandemic.   Location: Patient: home Provider: [facility]   I discussed the limitations, risks, security and privacy concerns of performing an evaluation and management service by telephone and the availability of in person appointments. The patient expressed understanding and agreed to proceed.  Unable to perform video visit due to video visit attempted and failed and/or patient does not have video capability.   Some vital signs may be absent or patient reported.   Reather Littler, LPN    Review of Systems     Cardiac Risk Factors include: advanced age (>84men, >61 women);male gender     Objective:    There were no vitals filed for this visit. There is no height or weight on file to calculate BMI.  Advanced Directives 06/20/2020 04/26/2020 10/27/2019  Does Patient Have a Medical Advance Directive? Yes Yes Yes  Type of Estate agent of [place];Living will Healthcare Power of [place];Living will Healthcare Power of [place];Living will  Does patient want to make changes to medical advance directive? - - No - Patient declined  Copy of Healthcare Power of Attorney in Chart? No - copy requested No - copy requested No - copy requested    Current Medications (verified) Outpatient Encounter Medications as of 06/20/2020  Medication Sig  . APPLE CIDER VINEGAR PO Take 500 mg by mouth.  . budesonide-formoterol (SYMBICORT) 160-4.5 MCG/ACT inhaler Inhale 2 puffs into the lungs 2 (two) times daily.  . Calcium Carbonate-Vitamin D (RA CALCIUM PLUS VITAMIN D PO) Take 525 mg by mouth 2 (two) times daily.  .  Cholecalciferol (VITAMIN D) 50 MCG (2000 UT) CAPS Take 2 capsules by mouth daily.  Marland Kitchen EPINEPHrine 0.3 mg/0.3 mL IJ SOAJ injection   . escitalopram (LEXAPRO) 10 MG tablet   . fexofenadine (ALLEGRA) 180 MG tablet Take 180 mg by mouth daily.  . fluticasone (FLONASE) 50 MCG/ACT nasal spray Place 2 sprays into both nostrils daily.  . Glucosamine-Chondroitin (GLUCOSAMINE CHONDR COMPLEX PO) Take 3,700 mg by mouth daily.  Marland Kitchen ibuprofen (ADVIL) 600 MG tablet Take 600 mg by mouth every 6 (six) hours as needed.  Marland Kitchen NAPROXEN DR PO Take by mouth as needed.   . naratriptan (AMERGE) 2.5 MG tablet   . polycarbophil (FIBERCON) 625 MG tablet Take 1,250 mg by mouth 2 (two) times daily.  . Riboflavin 400 MG TABS Take 1 tablet by mouth daily.  . rosuvastatin (CRESTOR) 10 MG tablet TAKE 1 TABLET EVERY DAY  . vitamin C (ASCORBIC ACID) 500 MG tablet Take 500 mg by mouth daily.  . Vitamin E 180 MG CAPS Take 1 capsule by mouth daily.  . Vitamins-Lipotropics (LIPO-FLAVONOID PLUS PO) Take 1,000 mg by mouth 2 (two) times daily.  . Zinc 50 MG CAPS Take 1 capsule by mouth daily.  . [DISCONTINUED] cephALEXin (KEFLEX) 500 MG capsule Take 1 capsule (500 mg total) by mouth 2 (two) times daily.  . [DISCONTINUED] CVS TRIPLE MAGNESIUM COMPLEX PO Take 300 mg by mouth 2 (two) times daily.  . [DISCONTINUED] Magnesium 400 MG CAPS Take 1 capsule by mouth 2 (two) times daily.  . [DISCONTINUED] predniSONE (DELTASONE) 10 MG tablet Start with  3 pills tomorrow. Taper over the next 6 days.  3,3,2,2,1,1.  . [DISCONTINUED] zonisamide (ZONEGRAN) 100 MG capsule Take 300 mg by mouth daily.   No facility-administered encounter medications on file as of 06/20/2020.    Allergies (verified) Patient has no known allergies.   History: Past Medical History:  Diagnosis Date  . Allergy 06/07/20   Went for Allergy Test  . Arthritis    hands, fingers  . Cataract    Replaced in 2019  . Colon polyp   . Diverticulosis   . GERD (gastroesophageal  reflux disease)    occasionally  . History of placement of ear tubes    2012- came out shortly after  . Hyperlipidemia   . Migraine    daily migraines  . Sinusitis    chronic  . Tendonitis    both ankles  . Tinnitus    Past Surgical History:  Procedure Laterality Date  . ELBOW SURGERY Right 2007  . ETHMOIDECTOMY Bilateral 10/27/2019   Procedure: TOTAL ETHMOIDECTOMY WITH FRONTAL SINUOTOMY;  Surgeon: Vernie Murders, MD;  Location: [facility];  Service: ENT;  Laterality: Bilateral;  . EYE SURGERY  2019   Replaced  . HAND SURGERY  1983  . HERNIA REPAIR  1995  . IMAGE GUIDED SINUS SURGERY Bilateral 10/27/2019   Procedure: IMAGE GUIDED SINUS SURGERY;  Surgeon: Vernie Murders, MD;  Location: [facility];  Service: ENT;  Laterality: Bilateral;  need stryker disk placed disk on or charge nurse desk 2-17   kp  . IMAGE GUIDED SINUS SURGERY Bilateral 04/26/2020   Procedure: IMAGE GUIDED SINUS SURGERY;  Surgeon: Vernie Murders, MD;  Location: [facility];  Service: ENT;  Laterality: Bilateral;  need stryker disk gave Brenda disk on 8-23 kp  . MENISCUS REPAIR Left 2018  . SEPTOPLASTY Bilateral 10/27/2019   Procedure: SEPTOPLASTY REVISION;  Surgeon: Vernie Murders, MD;  Location: [facility];  Service: ENT;  Laterality: Bilateral;  . SPHENOIDECTOMY Right 04/26/2020   Procedure: SPHENOIDECTOMY;  Surgeon: Vernie Murders, MD;  Location: [facility];  Service: ENT;  Laterality: Right;  . TONSILLECTOMY  1964   Family History  Problem Relation Age of Onset  . Cancer Father    Social History   Socioeconomic History  . Marital status: Married    Spouse name: Not on file  . Number of children: Not on file  . Years of education: Not on file  . Highest education level: Not on file  Occupational History  . Not on file  Tobacco Use  . Smoking status: Former Smoker    Packs/day: 1.00    Years: 26.00    Pack years: 26.00    Types: Cigarettes    Quit date:  09/01/1988    Years since quitting: 31.8  . Smokeless tobacco: Never Used  Substance and Sexual Activity  . Alcohol use: Yes    Alcohol/week: 7.0 standard drinks    Types: 7 Cans of beer per week    Comment: Social drinking only  . Drug use: Never  . Sexual activity: Yes  Other Topics Concern  . Not on file  Social History Narrative  . Not on file   Social Determinants of Health   Financial Resource Strain: Low Risk   . Difficulty of Paying Living Expenses: Not hard at all  Food Insecurity: No Food Insecurity  . Worried About Programme researcher, broadcasting/film/video in the Last Year: Never true  . Ran Out of Food in the Last Year: Never true  Transportation Needs: No Transportation Needs  . Lack of Transportation (Medical): No  . Lack of Transportation (Non-Medical): No  Physical Activity: Inactive  . Days of Exercise per Week: 0 days  . Minutes of Exercise per Session: 0 min  Stress: No Stress Concern Present  . Feeling of Stress : Not at all  Social Connections: Moderately Isolated  . Frequency of Communication with Friends and Family: More than three times a week  . Frequency of Social Gatherings with Friends and Family: More than three times a week  . Attends Religious Services: Never  . Active Member of Clubs or Organizations: No  . Attends Banker Meetings: Never  . Marital Status: Married    Tobacco Counseling Counseling given: Not Answered   Clinical Intake:  Pre-visit preparation completed: Yes  Pain : No/denies pain     Nutritional Risks: None Diabetes: No  How often do you need to have someone help you when you read instructions, pamphlets, or other written materials from your doctor or pharmacy?: 1 - Never    Interpreter Needed?: No  Information entered by :: Reather Littler LPN   Activities of Daily Living In your present state of health, do you have any difficulty performing the following activities: 06/20/2020 04/26/2020  Hearing? N N  Comment  declines hearing aids -  Vision? N N  Difficulty concentrating or making decisions? N N  Walking or climbing stairs? N N  Dressing or bathing? N N  Doing errands, shopping? N -  Preparing Food and eating ? N -  Using the Toilet? N -  In the past six months, have you accidently leaked urine? N -  Do you have problems with loss of bowel control? N -  Managing your Medications? N -  Managing your Finances? N -  Housekeeping or managing your Housekeeping? N -  Some recent data might be hidden    Patient Care Team: Duanne Limerick, MD as PCP - General (Family Medicine)  Indicate any recent Medical Services you may have received from other than [facility] providers in the past year (date may be approximate).     Assessment:   This is a routine wellness examination for Harjot.  Hearing/Vision screen  Hearing Screening             Right ear:           Left ear:           Comments: Pt denies hearing difficulty  Vision Screening Comments: Annual vision screenings at [facility]  Dietary issues and exercise activities discussed: Current Exercise Habits: The patient does not participate in regular exercise at present, Exercise limited by: None identified  Goals    . Increase physical activity     Recommend increasing physical activity to at least 3 days per week      Depression Screen PHQ 2/9 Scores 06/20/2020 04/17/2020 11/03/2019 07/07/2019  PHQ - 2 Score 0 0 0 0  PHQ- 9 Score - 0 0 0    Fall Risk Fall Risk  06/20/2020 04/17/2020 11/03/2019  Falls in the past year? 0 0 0  Number falls in past yr: 0 0 -  Injury with Fall? 0 0 -  Risk for fall due to : No Fall Risks No Fall Risks -  Follow up Falls prevention discussed Falls evaluation completed Falls evaluation completed    Any stairs in or around the home? No  If so, are there any without handrails? No  Home free of loose throw rugs in walkways, pet beds, electrical cords,  etc? Yes  Adequate lighting in your home to reduce risk of falls? Yes   ASSISTIVE DEVICES UTILIZED TO PREVENT FALLS:  Life alert? No  Use of a cane, walker or w/c? No  Grab bars in the bathroom? No  Shower chair or bench in shower? No  Elevated toilet seat or a handicapped toilet? No   TIMED UP AND GO:  Was the test performed? No . Telephonic visit.   Cognitive Function:     6CIT Screen 06/20/2020  What Year? 0 points  What month? 0 points  What time? 0 points  Count back from 20 0 points  Months in reverse 0 points  Repeat phrase 0 points  Total Score 0    Immunizations Immunization History  Administered Date(s) Administered  . Fluad Quad(high Dose 65+) 07/07/2019, 06/14/2020  . PFIZER SARS-COV-2 Vaccination 10/07/2019, 11/01/2019  . Pneumococcal Conjugate-13 12/24/2017  . Pneumococcal Polysaccharide-23 11/22/2019  . Zoster 09/01/2010    TDAP status: Due, Education has been provided regarding the importance of this vaccine. Advised may receive this vaccine at local pharmacy or Health Dept. Aware to provide a copy of the vaccination record if obtained from local pharmacy or Health Dept. Verbalized acceptance and understanding.   Flu Vaccine status: Up to date   Pneumococcal vaccine status: Up to date   Covid-19 vaccine status: Completed vaccines  Qualifies for Shingles Vaccine? Yes   Zostavax completed Yes   Shingrix Completed?: No.    Education has been provided regarding the importance of this vaccine. Patient has been advised to call insurance company to determine out of pocket expense if they have not yet received this vaccine. Advised may also receive vaccine at local pharmacy or Health Dept. Verbalized acceptance and understanding.  Screening Tests Health Maintenance  Topic Date Due  . COLONOSCOPY  09/14/2019  . TETANUS/TDAP  07/06/2020 (Originally 12/22/1969)  . INFLUENZA VACCINE  Completed  . COVID-19 Vaccine  Completed  . Hepatitis C Screening   Completed  . PNA vac Low Risk Adult  Completed    Health Maintenance  Health Maintenance Due  Topic Date Due  . COLONOSCOPY  09/14/2019    Colorectal cancer screening: Completed 09/13/14. Repeat every 5 years. Referral sent to [facility] today.   Lung Cancer Screening: (Low Dose CT Chest recommended if Age 42-80 years, 30 pack-year currently smoking OR have quit w/in 15years.) does not qualify.   Additional Screening:  Hepatitis C Screening: does qualify; Completed 11/03/18  Vision Screening: Recommended annual ophthalmology exams for early detection of glaucoma and other disorders of the eye. Is the patient up to date with their annual eye exam?  Yes  Who is the provider or what is the name of the office in which the patient attends annual eye exams? [facility]  Dental Screening: Recommended annual dental exams for proper oral hygiene  Community Resource Referral / Chronic Care Management: CRR required this visit?  No   CCM required this visit?  No      Plan:     I have personally reviewed and noted the following in the patient's chart:   . Medical and social history . Use of alcohol, tobacco or illicit drugs  . Current medications and supplements . Functional ability and status . Nutritional status . Physical activity . Advanced directives . List of other physicians . Hospitalizations, surgeries, and ER visits in previous 12 months . Vitals . Screenings to  include cognitive, depression, and falls . Referrals and appointments  In addition, I have reviewed and discussed with patient certain preventive protocols, quality metrics, and best practice recommendations. A written personalized care plan for preventive services as well as general preventive health recommendations were provided to patient.     Reather LittlerKasey Tayten Bergdoll, LPN   [id number]/08/657810/20/2021   Nurse Notes: none

## 2020-06-22 DIAGNOSIS — J301 Allergic rhinitis due to pollen: Secondary | ICD-10-CM | POA: Diagnosis not present

## 2020-06-25 ENCOUNTER — Other Ambulatory Visit (HOSPITAL_BASED_OUTPATIENT_CLINIC_OR_DEPARTMENT_OTHER): Payer: Self-pay | Admitting: Internal Medicine

## 2020-06-25 ENCOUNTER — Ambulatory Visit: Payer: Self-pay | Attending: Internal Medicine

## 2020-06-25 ENCOUNTER — Ambulatory Visit: Payer: Medicare PPO

## 2020-06-25 DIAGNOSIS — Z23 Encounter for immunization: Secondary | ICD-10-CM

## 2020-06-26 DIAGNOSIS — J301 Allergic rhinitis due to pollen: Secondary | ICD-10-CM | POA: Diagnosis not present

## 2020-06-27 DIAGNOSIS — J301 Allergic rhinitis due to pollen: Secondary | ICD-10-CM | POA: Diagnosis not present

## 2020-06-29 DIAGNOSIS — J301 Allergic rhinitis due to pollen: Secondary | ICD-10-CM | POA: Diagnosis not present

## 2020-07-02 ENCOUNTER — Telehealth (INDEPENDENT_AMBULATORY_CARE_PROVIDER_SITE_OTHER): Payer: Self-pay | Admitting: Gastroenterology

## 2020-07-02 ENCOUNTER — Other Ambulatory Visit: Payer: Self-pay

## 2020-07-02 DIAGNOSIS — Z86010 Personal history of colonic polyps: Secondary | ICD-10-CM

## 2020-07-02 NOTE — Progress Notes (Signed)
Gastroenterology Pre-Procedure Review  Request Date: Wed 07/18/20 Requesting Physician: Dr. Maximino Greenland  PATIENT REVIEW QUESTIONS: The patient responded to the following health history questions as indicated:    1. Are you having any GI issues? no 2. Do you have a personal history of Polyps? yes (2016 one polyp noted) performed in [place] 3. Do you have a family history of Colon Cancer or Polyps? no 4. Diabetes Mellitus? no 5. Joint replacements in the past 12 months?No major health issues.  Nasal surgery 04/2020 6. Major health problems in the past 3 months?no 7. Any artificial heart valves, MVP, or defibrillator?no    MEDICATIONS & ALLERGIES:    Patient reports the following regarding taking any anticoagulation/antiplatelet therapy:   Plavix, Coumadin, Eliquis, Xarelto, Lovenox, Pradaxa, Brilinta, or Effient? no Aspirin? no  Patient confirms/reports the following medications:  Current Outpatient Medications  Medication Sig Dispense Refill  . APPLE CIDER VINEGAR PO Take 500 mg by mouth.    . budesonide-formoterol (SYMBICORT) 160-4.5 MCG/ACT inhaler Inhale 2 puffs into the lungs 2 (two) times daily.    . Calcium Carbonate-Vitamin D (RA CALCIUM PLUS VITAMIN D PO) Take 525 mg by mouth 2 (two) times daily.    . Cholecalciferol (VITAMIN D) 50 MCG (2000 UT) CAPS Take 2 capsules by mouth daily.    Marland Kitchen EPINEPHrine 0.3 mg/0.3 mL IJ SOAJ injection     . escitalopram (LEXAPRO) 10 MG tablet     . fexofenadine (ALLEGRA) 180 MG tablet Take 180 mg by mouth daily.    . fluticasone (FLONASE) 50 MCG/ACT nasal spray Place 2 sprays into both nostrils daily.    . Glucosamine-Chondroitin (GLUCOSAMINE CHONDR COMPLEX PO) Take 3,700 mg by mouth daily.    Marland Kitchen ibuprofen (ADVIL) 600 MG tablet Take 600 mg by mouth every 6 (six) hours as needed.    Marland Kitchen NAPROXEN DR PO Take by mouth as needed.     . naratriptan (AMERGE) 2.5 MG tablet     . polycarbophil (FIBERCON) 625 MG tablet Take 1,250 mg by mouth 2 (two) times daily.     . Riboflavin 400 MG TABS Take 1 tablet by mouth daily.    . rosuvastatin (CRESTOR) 10 MG tablet TAKE 1 TABLET EVERY DAY 90 tablet 1  . vitamin C (ASCORBIC ACID) 500 MG tablet Take 500 mg by mouth daily.    . Vitamin E 180 MG CAPS Take 1 capsule by mouth daily.    . Vitamins-Lipotropics (LIPO-FLAVONOID PLUS PO) Take 1,000 mg by mouth 2 (two) times daily.    . Zinc 50 MG CAPS Take 1 capsule by mouth daily.     No current facility-administered medications for this visit.    Patient confirms/reports the following allergies:  No Known Allergies  No orders of the defined types were placed in this encounter.   AUTHORIZATION INFORMATION Primary Insurance: 1D#: Group #:  Secondary Insurance: 1D#: Group #:  SCHEDULE INFORMATION: Date: 07/18/20 Time: Location:[facility]

## 2020-07-03 DIAGNOSIS — J301 Allergic rhinitis due to pollen: Secondary | ICD-10-CM | POA: Diagnosis not present

## 2020-07-04 ENCOUNTER — Telehealth: Payer: Self-pay

## 2020-07-04 NOTE — Telephone Encounter (Signed)
Returned patients call. Pt states he would like to cancel his colonoscopy b/c he had one done in a year ago. Asked pt if he could provide documentation. He said he will drop it off this Friday or next Tuesday. Procedure has been cancelled.

## 2020-07-06 DIAGNOSIS — J301 Allergic rhinitis due to pollen: Secondary | ICD-10-CM | POA: Diagnosis not present

## 2020-07-10 DIAGNOSIS — J301 Allergic rhinitis due to pollen: Secondary | ICD-10-CM | POA: Diagnosis not present

## 2020-07-13 DIAGNOSIS — J301 Allergic rhinitis due to pollen: Secondary | ICD-10-CM | POA: Diagnosis not present

## 2020-07-17 DIAGNOSIS — J301 Allergic rhinitis due to pollen: Secondary | ICD-10-CM | POA: Diagnosis not present

## 2020-07-18 ENCOUNTER — Ambulatory Visit: Admit: 2020-07-18 | Payer: Medicare PPO | Admitting: Gastroenterology

## 2020-07-18 SURGERY — COLONOSCOPY WITH PROPOFOL
Anesthesia: General

## 2020-07-20 DIAGNOSIS — J301 Allergic rhinitis due to pollen: Secondary | ICD-10-CM | POA: Diagnosis not present

## 2020-07-24 DIAGNOSIS — J301 Allergic rhinitis due to pollen: Secondary | ICD-10-CM | POA: Diagnosis not present

## 2020-07-31 DIAGNOSIS — J301 Allergic rhinitis due to pollen: Secondary | ICD-10-CM | POA: Diagnosis not present

## 2020-08-07 DIAGNOSIS — J301 Allergic rhinitis due to pollen: Secondary | ICD-10-CM | POA: Diagnosis not present

## 2020-08-09 ENCOUNTER — Other Ambulatory Visit: Payer: Medicare PPO

## 2020-08-09 DIAGNOSIS — Z20822 Contact with and (suspected) exposure to covid-19: Secondary | ICD-10-CM

## 2020-08-10 LAB — SARS-COV-2, NAA 2 DAY TAT

## 2020-08-10 LAB — NOVEL CORONAVIRUS, NAA: SARS-CoV-2, NAA: NOT DETECTED

## 2020-08-14 DIAGNOSIS — J301 Allergic rhinitis due to pollen: Secondary | ICD-10-CM | POA: Diagnosis not present

## 2020-08-21 DIAGNOSIS — J301 Allergic rhinitis due to pollen: Secondary | ICD-10-CM | POA: Diagnosis not present

## 2020-08-28 DIAGNOSIS — J301 Allergic rhinitis due to pollen: Secondary | ICD-10-CM | POA: Diagnosis not present

## 2020-08-28 DIAGNOSIS — G43711 Chronic migraine without aura, intractable, with status migrainosus: Secondary | ICD-10-CM | POA: Diagnosis not present

## 2020-09-04 DIAGNOSIS — J301 Allergic rhinitis due to pollen: Secondary | ICD-10-CM | POA: Diagnosis not present

## 2020-09-18 DIAGNOSIS — J301 Allergic rhinitis due to pollen: Secondary | ICD-10-CM | POA: Diagnosis not present

## 2020-09-19 DIAGNOSIS — J301 Allergic rhinitis due to pollen: Secondary | ICD-10-CM | POA: Diagnosis not present

## 2020-09-25 DIAGNOSIS — J301 Allergic rhinitis due to pollen: Secondary | ICD-10-CM | POA: Diagnosis not present

## 2020-10-02 DIAGNOSIS — J301 Allergic rhinitis due to pollen: Secondary | ICD-10-CM | POA: Diagnosis not present

## 2020-10-02 DIAGNOSIS — G4452 New daily persistent headache (NDPH): Secondary | ICD-10-CM | POA: Diagnosis not present

## 2020-10-09 DIAGNOSIS — J301 Allergic rhinitis due to pollen: Secondary | ICD-10-CM | POA: Diagnosis not present

## 2020-10-16 DIAGNOSIS — J301 Allergic rhinitis due to pollen: Secondary | ICD-10-CM | POA: Diagnosis not present

## 2020-10-23 DIAGNOSIS — J301 Allergic rhinitis due to pollen: Secondary | ICD-10-CM | POA: Diagnosis not present

## 2020-10-28 ENCOUNTER — Other Ambulatory Visit: Payer: Self-pay | Admitting: Family Medicine

## 2020-10-28 DIAGNOSIS — E785 Hyperlipidemia, unspecified: Secondary | ICD-10-CM

## 2020-10-28 NOTE — Telephone Encounter (Signed)
Requested Prescriptions  Pending Prescriptions Disp Refills  . rosuvastatin (CRESTOR) 10 MG tablet [Pharmacy Med Name: ROSUVASTATIN CALCIUM 10 MG Tablet] 90 tablet 0    Sig: TAKE 1 TABLET EVERY DAY     Cardiovascular:  Antilipid - Statins Failed - 10/28/2020  7:36 AM      Failed - LDL in normal range and within 360 days    LDL Chol Calc (NIH)  Date Value Ref Range Status  11/03/2019 85 0 - 99 mg/dL Final         Failed - Triglycerides in normal range and within 360 days    Triglycerides  Date Value Ref Range Status  11/03/2019 180 (H) 0 - 149 mg/dL Final         Passed - Total Cholesterol in normal range and within 360 days    Cholesterol, Total  Date Value Ref Range Status  11/03/2019 177 100 - 199 mg/dL Final         Passed - HDL in normal range and within 360 days    HDL  Date Value Ref Range Status  11/03/2019 62 >39 mg/dL Final         Passed - Patient is not pregnant      Passed - Valid encounter within last 12 months    Recent Outpatient Visits          6 months ago Tenosynovitis of left foot   [facility] Duanne Limerick, MD   12 months ago Encounter for annual physical exam   [facility] Duanne Limerick, MD   1 year ago Establishing care with new doctor, encounter for   [facility] Duanne Limerick, MD

## 2020-10-30 DIAGNOSIS — J301 Allergic rhinitis due to pollen: Secondary | ICD-10-CM | POA: Diagnosis not present

## 2020-11-06 DIAGNOSIS — J301 Allergic rhinitis due to pollen: Secondary | ICD-10-CM | POA: Diagnosis not present

## 2020-11-13 DIAGNOSIS — J301 Allergic rhinitis due to pollen: Secondary | ICD-10-CM | POA: Diagnosis not present

## 2020-11-14 DIAGNOSIS — J4550 Severe persistent asthma, uncomplicated: Secondary | ICD-10-CM | POA: Diagnosis not present

## 2020-11-14 DIAGNOSIS — G4719 Other hypersomnia: Secondary | ICD-10-CM | POA: Diagnosis not present

## 2020-11-20 DIAGNOSIS — J301 Allergic rhinitis due to pollen: Secondary | ICD-10-CM | POA: Diagnosis not present

## 2020-11-26 DIAGNOSIS — J4550 Severe persistent asthma, uncomplicated: Secondary | ICD-10-CM | POA: Diagnosis not present

## 2020-11-27 DIAGNOSIS — J301 Allergic rhinitis due to pollen: Secondary | ICD-10-CM | POA: Diagnosis not present

## 2020-12-04 DIAGNOSIS — J301 Allergic rhinitis due to pollen: Secondary | ICD-10-CM | POA: Diagnosis not present

## 2020-12-11 DIAGNOSIS — J301 Allergic rhinitis due to pollen: Secondary | ICD-10-CM | POA: Diagnosis not present

## 2020-12-18 DIAGNOSIS — H26493 Other secondary cataract, bilateral: Secondary | ICD-10-CM | POA: Diagnosis not present

## 2020-12-18 DIAGNOSIS — J301 Allergic rhinitis due to pollen: Secondary | ICD-10-CM | POA: Diagnosis not present

## 2020-12-18 DIAGNOSIS — Z0100 Encounter for examination of eyes and vision without abnormal findings: Secondary | ICD-10-CM | POA: Diagnosis not present

## 2020-12-25 DIAGNOSIS — J301 Allergic rhinitis due to pollen: Secondary | ICD-10-CM | POA: Diagnosis not present

## 2020-12-27 DIAGNOSIS — J4550 Severe persistent asthma, uncomplicated: Secondary | ICD-10-CM | POA: Diagnosis not present

## 2021-01-01 DIAGNOSIS — J301 Allergic rhinitis due to pollen: Secondary | ICD-10-CM | POA: Diagnosis not present

## 2021-01-08 DIAGNOSIS — J301 Allergic rhinitis due to pollen: Secondary | ICD-10-CM | POA: Diagnosis not present

## 2021-01-10 ENCOUNTER — Other Ambulatory Visit: Payer: Self-pay | Admitting: Family Medicine

## 2021-01-10 DIAGNOSIS — E785 Hyperlipidemia, unspecified: Secondary | ICD-10-CM

## 2021-01-14 ENCOUNTER — Encounter: Payer: Self-pay | Admitting: Family Medicine

## 2021-01-14 ENCOUNTER — Ambulatory Visit: Payer: Medicare PPO | Admitting: Family Medicine

## 2021-01-14 VITALS — BP 100/60 | HR 80 | Ht 64.0 in | Wt 154.0 lb

## 2021-01-14 DIAGNOSIS — R1319 Other dysphagia: Secondary | ICD-10-CM | POA: Diagnosis not present

## 2021-01-14 DIAGNOSIS — E785 Hyperlipidemia, unspecified: Secondary | ICD-10-CM

## 2021-01-14 DIAGNOSIS — R69 Illness, unspecified: Secondary | ICD-10-CM

## 2021-01-14 MED ORDER — ROSUVASTATIN CALCIUM 10 MG PO TABS: 10.0000 mg | ORAL_TABLET | Freq: Every day | ORAL | 1 refills | Status: DC

## 2021-01-14 NOTE — Progress Notes (Signed)
Date:  01/14/2021   Name:  Blake Patrick   DOB:  Sep 23, 1950   MRN:  [id number]   Chief Complaint: Hyperlipidemia and Dysphagia (Started approx 1 1/2 months ago once a week- will swallow and it feels like "my esophagus is squeezing" )  Hyperlipidemia This is a chronic problem. The current episode started more than 1 year ago. The problem is controlled. Recent lipid tests were reviewed and are normal. He has no history of chronic renal disease, diabetes, hypothyroidism, liver disease, obesity or nephrotic syndrome. Pertinent negatives include no chest pain, focal sensory loss, focal weakness, leg pain, myalgias or shortness of breath. Current antihyperlipidemic treatment includes statins. The current treatment provides moderate improvement of lipids. There are no compliance problems.  Risk factors for coronary artery disease include dyslipidemia.    Lab Results  Component Value Date   CREATININE 1.21 11/03/2019   BUN 16 11/03/2019   NA 139 11/03/2019   K 4.7 11/03/2019   CL 99 11/03/2019   CO2 24 11/03/2019   Lab Results  Component Value Date   CHOL 177 11/03/2019   HDL 62 11/03/2019   LDLCALC 85 11/03/2019   TRIG 180 (H) 11/03/2019   Lab Results  Component Value Date   TSH 0.69 07/08/2018   Lab Results  Component Value Date   HGBA1C 5.4 04/06/2019   No results found for: WBC, HGB, HCT, MCV, PLT Lab Results  Component Value Date   ALT 31 07/08/2018   AST 18 07/08/2018   ALKPHOS 99 07/08/2018     Review of Systems  Constitutional: Negative for chills and fever.  HENT: Negative for drooling, ear discharge, ear pain and sore throat.   Respiratory: Negative for cough, shortness of breath and wheezing.   Cardiovascular: Negative for chest pain, palpitations and leg swelling.  Gastrointestinal: Negative for abdominal pain, blood in stool, constipation, diarrhea and nausea.  Endocrine: Negative for polydipsia.  Genitourinary: Negative for dysuria, frequency, hematuria and  urgency.  Musculoskeletal: Negative for back pain, myalgias and neck pain.  Skin: Negative for rash.  Allergic/Immunologic: Negative for environmental allergies.  Neurological: Negative for dizziness, focal weakness and headaches.  Hematological: Does not bruise/bleed easily.  Psychiatric/Behavioral: Negative for suicidal ideas. The patient is not nervous/anxious.     Patient Active Problem List   Diagnosis Date Noted  . Migraines 12/21/2017    No Known Allergies  Past Surgical History:  Procedure Laterality Date  . ELBOW SURGERY Right 2007  . ETHMOIDECTOMY Bilateral 10/27/2019   Procedure: TOTAL ETHMOIDECTOMY WITH FRONTAL SINUOTOMY;  Surgeon: Vernie Murders, MD;  Location: [facility];  Service: ENT;  Laterality: Bilateral;  . EYE SURGERY  2019   Replaced  . HAND SURGERY  1983  . HERNIA REPAIR  1995  . IMAGE GUIDED SINUS SURGERY Bilateral 10/27/2019   Procedure: IMAGE GUIDED SINUS SURGERY;  Surgeon: Vernie Murders, MD;  Location: [facility];  Service: ENT;  Laterality: Bilateral;  need stryker disk placed disk on or charge nurse desk 2-17   kp  . IMAGE GUIDED SINUS SURGERY Bilateral 04/26/2020   Procedure: IMAGE GUIDED SINUS SURGERY;  Surgeon: Vernie Murders, MD;  Location: [facility];  Service: ENT;  Laterality: Bilateral;  need stryker disk gave Brenda disk on 8-23 kp  . MENISCUS REPAIR Left 2018  . SEPTOPLASTY Bilateral 10/27/2019   Procedure: SEPTOPLASTY REVISION;  Surgeon: Vernie Murders, MD;  Location: [facility];  Service: ENT;  Laterality: Bilateral;  . SPHENOIDECTOMY Right 04/26/2020   Procedure:  SPHENOIDECTOMY;  Surgeon: Vernie Murders, MD;  Location: [facility];  Service: ENT;  Laterality: Right;  . TONSILLECTOMY  1964    Social History   Tobacco Use  . Smoking status: Former Smoker    Packs/day: 1.00    Years: 26.00    Pack years: 26.00    Types: Cigarettes    Quit date: 09/01/1988    Years since quitting: 32.3  .  Smokeless tobacco: Never Used  Substance Use Topics  . Alcohol use: Yes    Alcohol/week: 7.0 standard drinks    Types: 7 Cans of beer per week    Comment: Social drinking only  . Drug use: Never     Medication list has been reviewed and updated.  Current Meds  Medication Sig  . APPLE CIDER VINEGAR PO Take 500 mg by mouth.  . budesonide (PULMICORT) 0.5 MG/2ML nebulizer solution Inhale into the lungs.  . Calcium Carbonate-Vitamin D (RA CALCIUM PLUS VITAMIN D PO) Take 525 mg by mouth 2 (two) times daily.  . Cholecalciferol (VITAMIN D) 50 MCG (2000 UT) CAPS Take 2 capsules by mouth daily.  Marland Kitchen EPINEPHrine 0.3 mg/0.3 mL IJ SOAJ injection   . escitalopram (LEXAPRO) 10 MG tablet   . fexofenadine (ALLEGRA) 180 MG tablet Take 180 mg by mouth daily.  . fluticasone (FLONASE) 50 MCG/ACT nasal spray Place 2 sprays into both nostrils daily.  Marland Kitchen gabapentin (NEURONTIN) 300 MG capsule Take 2 capsules by mouth 3 (three) times daily. neurology  . Glucosamine-Chondroitin (GLUCOSAMINE CHONDR COMPLEX PO) Take 3,700 mg by mouth daily.  Marland Kitchen ibuprofen (ADVIL) 600 MG tablet Take 600 mg by mouth every 6 (six) hours as needed.  Marland Kitchen ipratropium-albuterol (DUONEB) 0.5-2.5 (3) MG/3ML SOLN Inhale into the lungs.  Marylin Crosby Succinate 100 MG TABS Take by mouth. neuro  . naratriptan (AMERGE) 2.5 MG tablet   . polycarbophil (FIBERCON) 625 MG tablet Take 1,250 mg by mouth 2 (two) times daily.  . Riboflavin 400 MG TABS Take 1 tablet by mouth daily.  . rosuvastatin (CRESTOR) 10 MG tablet TAKE 1 TABLET EVERY DAY  . vitamin C (ASCORBIC ACID) 500 MG tablet Take 500 mg by mouth daily.  . Vitamin E 180 MG CAPS Take 1 capsule by mouth daily.  . Vitamins-Lipotropics (LIPO-FLAVONOID PLUS PO) Take 1,000 mg by mouth 2 (two) times daily.  . Zinc 50 MG CAPS Take 1 capsule by mouth daily.  . [DISCONTINUED] budesonide-formoterol (SYMBICORT) 160-4.5 MCG/ACT inhaler Inhale 2 puffs into the lungs 2 (two) times daily.  . [DISCONTINUED]  NAPROXEN DR PO Take by mouth as needed.     PHQ 2/9 Scores 01/14/2021 06/20/2020 04/17/2020 11/03/2019  PHQ - 2 Score 0 0 0 0  PHQ- 9 Score 0 - 0 0    GAD 7 : Generalized Anxiety Score 01/14/2021 04/17/2020 11/03/2019 07/07/2019  Nervous, Anxious, on Edge 0 0 0 0  Control/stop worrying 0 0 0 0  Worry too much - different things 0 0 0 0  Trouble relaxing 0 0 0 0  Restless 0 0 0 0  Easily annoyed or irritable 0 0 0 0  Afraid - awful might happen 0 0 0 0  Total GAD 7 Score 0 0 0 0  Anxiety Difficulty - Not difficult at all Not difficult at all -    BP Readings from Last 3 Encounters:  01/14/21 100/60  04/26/20 128/81  04/17/20 140/84    Physical Exam  Wt Readings from Last 3 Encounters:  01/14/21 154 lb (69.9 kg)  04/26/20 146 lb (66.2 kg)  04/17/20 151 lb (68.5 kg)    BP 100/60   Pulse 80   Ht 5\' 4"  (1.626 m)   Wt 154 lb (69.9 kg)   BMI 26.43 kg/m   Assessment and Plan:  1. Hyperlipidemia, unspecified hyperlipidemia type Chronic.  Controlled.  Stable.  Continue rosuvastatin 10 mg once a day.  Will check lipid panel for current status of LDL. - rosuvastatin (CRESTOR) 10 MG tablet; Take 1 tablet (10 mg total) by mouth daily.  Dispense: 90 tablet; Refill: 1 - Lipid Panel With LDL/HDL Ratio  2. Taking medication for chronic disease Patient is tolerating Crestor well and we will check LFTs for hepatic toxicity. - Hepatic function panel

## 2021-01-14 NOTE — Addendum Note (Signed)
Addended by: Arthur Holms L on: 01/14/2021 02:00 PM   Modules accepted: Orders

## 2021-01-15 DIAGNOSIS — J301 Allergic rhinitis due to pollen: Secondary | ICD-10-CM | POA: Diagnosis not present

## 2021-01-15 LAB — HEPATIC FUNCTION PANEL
ALT: 18 IU/L (ref 0–44)
AST: 10 IU/L (ref 0–40)
Albumin: 4.6 g/dL (ref 3.8–4.8)
Alkaline Phosphatase: 117 IU/L (ref 44–121)
Bilirubin Total: 0.5 mg/dL (ref 0.0–1.2)
Bilirubin, Direct: 0.15 mg/dL (ref 0.00–0.40)
Total Protein: 6.7 g/dL (ref 6.0–8.5)

## 2021-01-15 LAB — LIPID PANEL WITH LDL/HDL RATIO
Cholesterol, Total: 184 mg/dL (ref 100–199)
HDL: 69 mg/dL (ref >39)
LDL Chol Calc (NIH): 100 mg/dL — ABNORMAL HIGH (ref 0–99)
LDL/HDL Ratio: 1.4 ratio (ref 0.0–3.6)
Triglycerides: 85 mg/dL (ref 0–149)
VLDL Cholesterol Cal: 15 mg/dL (ref 5–40)

## 2021-01-22 DIAGNOSIS — J301 Allergic rhinitis due to pollen: Secondary | ICD-10-CM | POA: Diagnosis not present

## 2021-01-26 DIAGNOSIS — J4550 Severe persistent asthma, uncomplicated: Secondary | ICD-10-CM | POA: Diagnosis not present

## 2021-01-27 DIAGNOSIS — G4733 Obstructive sleep apnea (adult) (pediatric): Secondary | ICD-10-CM | POA: Diagnosis not present

## 2021-01-29 DIAGNOSIS — J301 Allergic rhinitis due to pollen: Secondary | ICD-10-CM | POA: Diagnosis not present

## 2021-02-05 DIAGNOSIS — J301 Allergic rhinitis due to pollen: Secondary | ICD-10-CM | POA: Diagnosis not present

## 2021-02-05 DIAGNOSIS — G4452 New daily persistent headache (NDPH): Secondary | ICD-10-CM | POA: Diagnosis not present

## 2021-02-05 DIAGNOSIS — R519 Headache, unspecified: Secondary | ICD-10-CM | POA: Diagnosis not present

## 2021-02-08 ENCOUNTER — Other Ambulatory Visit: Payer: Self-pay

## 2021-02-08 ENCOUNTER — Emergency Department: Payer: Medicare PPO

## 2021-02-08 ENCOUNTER — Emergency Department
Admission: EM | Admit: 2021-02-08 | Discharge: 2021-02-08 | Disposition: A | Discharge status: Other Acute Inpatient Hospital | Payer: Medicare PPO | Attending: Emergency Medicine | Admitting: Emergency Medicine

## 2021-02-08 ENCOUNTER — Encounter: Payer: Self-pay | Admitting: Emergency Medicine

## 2021-02-08 DIAGNOSIS — S9304XA Dislocation of right ankle joint, initial encounter: Secondary | ICD-10-CM | POA: Diagnosis not present

## 2021-02-08 DIAGNOSIS — S99921A Unspecified injury of right foot, initial encounter: Secondary | ICD-10-CM | POA: Diagnosis present

## 2021-02-08 DIAGNOSIS — M7731 Calcaneal spur, right foot: Secondary | ICD-10-CM | POA: Diagnosis not present

## 2021-02-08 DIAGNOSIS — M7989 Other specified soft tissue disorders: Secondary | ICD-10-CM | POA: Diagnosis not present

## 2021-02-08 DIAGNOSIS — S92009A Unspecified fracture of unspecified calcaneus, initial encounter for closed fracture: Secondary | ICD-10-CM | POA: Diagnosis not present

## 2021-02-08 DIAGNOSIS — Y33XXXA Other specified events, undetermined intent, initial encounter: Secondary | ICD-10-CM | POA: Diagnosis not present

## 2021-02-08 DIAGNOSIS — J45909 Unspecified asthma, uncomplicated: Secondary | ICD-10-CM | POA: Diagnosis not present

## 2021-02-08 DIAGNOSIS — W19XXXA Unspecified fall, initial encounter: Secondary | ICD-10-CM | POA: Diagnosis not present

## 2021-02-08 DIAGNOSIS — S92021A Displaced fracture of anterior process of right calcaneus, initial encounter for closed fracture: Secondary | ICD-10-CM | POA: Diagnosis not present

## 2021-02-08 DIAGNOSIS — G43709 Chronic migraine without aura, not intractable, without status migrainosus: Secondary | ICD-10-CM | POA: Diagnosis not present

## 2021-02-08 DIAGNOSIS — J4550 Severe persistent asthma, uncomplicated: Secondary | ICD-10-CM | POA: Diagnosis not present

## 2021-02-08 DIAGNOSIS — S93314A Dislocation of tarsal joint of right foot, initial encounter: Secondary | ICD-10-CM | POA: Diagnosis not present

## 2021-02-08 DIAGNOSIS — Z20822 Contact with and (suspected) exposure to covid-19: Secondary | ICD-10-CM | POA: Insufficient documentation

## 2021-02-08 DIAGNOSIS — S92901A Unspecified fracture of right foot, initial encounter for closed fracture: Secondary | ICD-10-CM | POA: Diagnosis not present

## 2021-02-08 DIAGNOSIS — S92253A Displaced fracture of navicular [scaphoid] of unspecified foot, initial encounter for closed fracture: Secondary | ICD-10-CM | POA: Diagnosis not present

## 2021-02-08 DIAGNOSIS — M79671 Pain in right foot: Secondary | ICD-10-CM | POA: Diagnosis not present

## 2021-02-08 DIAGNOSIS — M79672 Pain in left foot: Secondary | ICD-10-CM | POA: Diagnosis not present

## 2021-02-08 DIAGNOSIS — S92251A Displaced fracture of navicular [scaphoid] of right foot, initial encounter for closed fracture: Secondary | ICD-10-CM | POA: Diagnosis not present

## 2021-02-08 DIAGNOSIS — E785 Hyperlipidemia, unspecified: Secondary | ICD-10-CM | POA: Diagnosis not present

## 2021-02-08 DIAGNOSIS — M25571 Pain in right ankle and joints of right foot: Secondary | ICD-10-CM | POA: Diagnosis not present

## 2021-02-08 DIAGNOSIS — R0902 Hypoxemia: Secondary | ICD-10-CM | POA: Diagnosis not present

## 2021-02-08 DIAGNOSIS — S22080A Wedge compression fracture of T11-T12 vertebra, initial encounter for closed fracture: Secondary | ICD-10-CM | POA: Diagnosis not present

## 2021-02-08 DIAGNOSIS — S92121A Displaced fracture of body of right talus, initial encounter for closed fracture: Secondary | ICD-10-CM | POA: Insufficient documentation

## 2021-02-08 DIAGNOSIS — Z9889 Other specified postprocedural states: Secondary | ICD-10-CM | POA: Diagnosis not present

## 2021-02-08 DIAGNOSIS — Z87891 Personal history of nicotine dependence: Secondary | ICD-10-CM | POA: Insufficient documentation

## 2021-02-08 DIAGNOSIS — W11XXXA Fall on and from ladder, initial encounter: Secondary | ICD-10-CM | POA: Diagnosis not present

## 2021-02-08 DIAGNOSIS — R279 Unspecified lack of coordination: Secondary | ICD-10-CM | POA: Diagnosis not present

## 2021-02-08 DIAGNOSIS — Z743 Need for continuous supervision: Secondary | ICD-10-CM | POA: Diagnosis not present

## 2021-02-08 DIAGNOSIS — S92001A Unspecified fracture of right calcaneus, initial encounter for closed fracture: Secondary | ICD-10-CM | POA: Diagnosis not present

## 2021-02-08 DIAGNOSIS — S92241A Displaced fracture of medial cuneiform of right foot, initial encounter for closed fracture: Secondary | ICD-10-CM | POA: Diagnosis not present

## 2021-02-08 DIAGNOSIS — S92221A Displaced fracture of lateral cuneiform of right foot, initial encounter for closed fracture: Secondary | ICD-10-CM | POA: Diagnosis not present

## 2021-02-08 DIAGNOSIS — S92211A Displaced fracture of cuboid bone of right foot, initial encounter for closed fracture: Secondary | ICD-10-CM | POA: Diagnosis not present

## 2021-02-08 LAB — RESP PANEL BY RT-PCR (FLU A&B, COVID) ARPGX2
Influenza A by PCR: NEGATIVE
Influenza B by PCR: NEGATIVE
SARS Coronavirus 2 by RT PCR: NEGATIVE

## 2021-02-08 MED ORDER — HYDROMORPHONE HCL 1 MG/ML IJ SOLN
1.0000 mg | Freq: Once | INTRAMUSCULAR | Status: AC
  Administered 2021-02-08: 1 mg via INTRAVENOUS
  Filled 2021-02-08: qty 1

## 2021-02-08 MED ORDER — OXYCODONE HCL 5 MG PO TABS: 5.0000 mg | ORAL_TABLET | Freq: Three times a day (TID) | ORAL | 0 refills | Status: AC | PRN

## 2021-02-08 MED ORDER — TRAMADOL HCL 50 MG PO TABS
50.0000 mg | ORAL_TABLET | Freq: Once | ORAL | Status: AC
  Administered 2021-02-08: 50 mg via ORAL
  Filled 2021-02-08: qty 1

## 2021-02-08 NOTE — ED Notes (Signed)
EMTALA reviewed by this RN.  Transfer consent signed. ?

## 2021-02-08 NOTE — ED Provider Notes (Signed)
[facility] Emergency Department Provider Note ____________________________________________  Time seen: 1527  I have reviewed the triage vital signs and the nursing notes.  HISTORY  Chief Complaint  Fall  HPI Blake Patrick is a 70 y.o. male with the below medical history, presents himself to the ED for evaluation of acute right foot pain and disability.  He drove himself from scene of the incident, where he apparently fell approximately 10 feet to the ground from a 12 foot ladder.  Patient landed on both feet at the same time. He denies any head injury or LOC.  He also denies any neck pain, back pain, hip pain, or saddle anesthesia.  He presents primarily with right foot pain and deformity.  He also reports some left heel pain.  Past Medical History:  Diagnosis Date   Allergy 06/07/20   Went for Allergy Test   Arthritis    hands, fingers   Cataract    Replaced in 2019   Colon polyp    Diverticulosis    GERD (gastroesophageal reflux disease)    occasionally   History of placement of ear tubes    2012- came out shortly after   Hyperlipidemia    Migraine    daily migraines   Sinusitis    chronic   Tendonitis    both ankles   Tinnitus     Patient Active Problem List   Diagnosis Date Noted   Migraines 12/21/2017    Past Surgical History:  Procedure Laterality Date   ELBOW SURGERY Right 2007   ETHMOIDECTOMY Bilateral 10/27/2019   Procedure: TOTAL ETHMOIDECTOMY WITH FRONTAL SINUOTOMY;  Surgeon: Vernie Murders, MD;  Location: [facility];  Service: ENT;  Laterality: Bilateral;   EYE SURGERY  2019   Replaced   HAND SURGERY  1983   HERNIA REPAIR  1995   IMAGE GUIDED SINUS SURGERY Bilateral 10/27/2019   Procedure: IMAGE GUIDED SINUS SURGERY;  Surgeon: Vernie Murders, MD;  Location: [facility];  Service: ENT;  Laterality: Bilateral;  need stryker disk placed disk on or charge nurse desk 2-17   kp   IMAGE GUIDED SINUS SURGERY Bilateral  04/26/2020   Procedure: IMAGE GUIDED SINUS SURGERY;  Surgeon: Vernie Murders, MD;  Location: [facility];  Service: ENT;  Laterality: Bilateral;  need stryker disk gave Steward Drone disk on 8-23 kp   MENISCUS REPAIR Left 2018   SEPTOPLASTY Bilateral 10/27/2019   Procedure: SEPTOPLASTY REVISION;  Surgeon: Vernie Murders, MD;  Location: [facility];  Service: ENT;  Laterality: Bilateral;   SPHENOIDECTOMY Right 04/26/2020   Procedure: Selina Cooley;  Surgeon: Vernie Murders, MD;  Location: [facility];  Service: ENT;  Laterality: Right;   TONSILLECTOMY  1964    Prior to Admission medications   Medication Sig Start Date End Date Taking? Authorizing Provider  oxyCODONE (ROXICODONE) 5 MG immediate release tablet Take 1 tablet (5 mg total) by mouth every 8 (eight) hours as needed for up to 5 days. 02/08/21 02/13/21 Yes Tykeem Lanzer, Charlesetta Ivory, PA-C  APPLE CIDER VINEGAR PO Take 500 mg by mouth.    [provider]  budesonide (PULMICORT) 0.5 MG/2ML nebulizer solution Inhale into the lungs. 11/14/20 11/14/21  [provider]  Calcium Carbonate-Vitamin D (RA CALCIUM PLUS VITAMIN D PO) Take 525 mg by mouth 2 (two) times daily.    [provider]  Cholecalciferol (VITAMIN D) 50 MCG (2000 UT) CAPS Take 2 capsules by mouth daily.    [provider]  EPINEPHrine 0.3  mg/0.3 mL IJ SOAJ injection  06/13/20   [provider]  escitalopram (LEXAPRO) 10 MG tablet  06/02/20   [provider]  fexofenadine (ALLEGRA) 180 MG tablet Take 180 mg by mouth daily.    [provider]  fluticasone (FLONASE) 50 MCG/ACT nasal spray Place 2 sprays into both nostrils daily. 04/16/20   [provider]  gabapentin (NEURONTIN) 300 MG capsule Take 2 capsules by mouth 3 (three) times daily. neurology 12/17/20 12/17/21  [provider]  Glucosamine-Chondroitin (GLUCOSAMINE CHONDR COMPLEX PO) Take 3,700 mg by mouth daily.    [provider]   ibuprofen (ADVIL) 600 MG tablet Take 600 mg by mouth every 6 (six) hours as needed.    [provider]  ipratropium-albuterol (DUONEB) 0.5-2.5 (3) MG/3ML SOLN Inhale into the lungs. 11/14/20 11/09/21  [provider]  Lasmiditan Succinate 100 MG TABS Take by mouth. neuro 10/15/20   [provider]  naratriptan (AMERGE) 2.5 MG tablet  05/24/20   [provider]  polycarbophil (FIBERCON) 625 MG tablet Take 1,250 mg by mouth 2 (two) times daily.    [provider]  Riboflavin 400 MG TABS Take 1 tablet by mouth daily.    [provider]  rosuvastatin (CRESTOR) 10 MG tablet Take 1 tablet (10 mg total) by mouth daily. 01/14/21   Duanne Limerick, MD  vitamin C (ASCORBIC ACID) 500 MG tablet Take 500 mg by mouth daily.    [provider]  Vitamin E 180 MG CAPS Take 1 capsule by mouth daily.    [provider]  Vitamins-Lipotropics (LIPO-FLAVONOID PLUS PO) Take 1,000 mg by mouth 2 (two) times daily.    [provider]  Zinc 50 MG CAPS Take 1 capsule by mouth daily.    [provider]    Allergies Patient has no known allergies.  Family History  Problem Relation Age of Onset   Cancer Father     Social History Social History   Tobacco Use   Smoking status: Former    Packs/day: 1.00    Years: 26.00    Pack years: 26.00    Types: Cigarettes    Quit date: 09/01/1988    Years since quitting: 32.4   Smokeless tobacco: Never  Substance Use Topics   Alcohol use: Yes    Alcohol/week: 7.0 standard drinks    Types: 7 Cans of beer per week    Comment: Social drinking only   Drug use: Never    Review of Systems  Constitutional: Negative for fever. Eyes: Negative for visual changes. ENT: Negative for sore throat. Cardiovascular: Negative for chest pain. Respiratory: Negative for shortness of breath. Gastrointestinal: Negative for abdominal pain, vomiting and diarrhea. Genitourinary: Negative for  dysuria. Musculoskeletal: Negative for back pain.  Right greater than left foot pain as above. Skin: Negative for rash. Neurological: Negative for headaches, focal weakness or numbness. ____________________________________________  PHYSICAL EXAM:  VITAL SIGNS: ED Triage Vitals  Enc Vitals Group     BP 02/08/21 1400 124/74     Pulse Rate 02/08/21 1400 69     Resp 02/08/21 1400 20     Temp 02/08/21 1400 98.2 F (36.8 C)     Temp Source 02/08/21 1400 Oral     SpO2 02/08/21 1400 97 %     Weight 02/08/21 1400 155 lb (70.3 kg)     Height 02/08/21 1400 5\' 4"  (1.626 m)     Head Circumference --      Peak Flow --  Pain Score 02/08/21 1403 10     Pain Loc --      Pain Edu? --      Excl. in GC? --     Constitutional: Alert and oriented. Well appearing and in no distress. GCS = 15 Head: Normocephalic and atraumatic. Eyes: Conjunctivae are normal. Normal extraocular movements Neck: Supple. Normal ROM. No midline tenderness Cardiovascular: Normal rate, regular rhythm. Normal distal pulses and cap refill Respiratory: Normal respiratory effort. No wheezes/rales/rhonchi. Gastrointestinal: Soft and nontender. No distention. Musculoskeletal: Normal spinal alignment without midline tenderness, spasm, vomiting, or step-off.  Patient with obvious deformity to the midfoot on the right.  He is able demonstrate normal toe flexion on exam.  The left foot is without deformity or dislocation.  Normal ankle exam and mild tenderness to the calcaneal heel.  Nontender with normal range of motion in all extremities.  Neurologic: Cranial nerves II to XII grossly intact.  Normal gross sensation. Normal speech and language. No gross focal neurologic deficits are appreciated. Skin:  Skin is warm, dry and intact. No rash noted.  No cyanosis, clubbing, or edema is noted. ____________________________________________   RADIOLOGY  DG Right Ankle  IMPRESSION: Apparent talonavicular dislocation. No fracture. No  evident arthropathy. There are calcaneal spurs.    DG Right Foot  IMPRESSION: Apparent talonavicular joint dislocation with the navicular is slightly superiorly with respect to the talus. Apparent fracture of the cuboid with apparent impaction in this area. No other fracture or dislocation evident. Narrowing first MTP joint bony overgrowth distal first metatarsal. Other joint spaces appear.  DG Left Foot  IMPRESSION: No fracture or dislocation.  No evident arthropathy.   CT Right Foot  IMPRESSION: 1. Dorsal fracture-dislocation at the mid tarsal joint as described with comminuted fractures of the cuboid, lateral and middle cuneiform bones. There are lesser fractures involving the anterior process of the calcaneus and the lateral aspect of the navicular. 2. No intra-articular extension of the fractures into the Lisfranc joint is seen, and the alignment at that joint appears maintained. 3. Potential impingement on the peroneus longus tendon within the comminuted cuboid fracture.  CT Left Foot  IMPRESSION: 1. No acute osseous abnormality.   I, Lissa HoardJenise V Bacon-Alaiah Lundy, personally viewed and evaluated these images (plain radiographs) as part of my medical decision making, as well as reviewing the written report by the radiologist.  ____________________________________________  PROCEDURES  Ultram 50 mg PO Dilaudid 1 mg IVP  Procedures ____________________________________________   INITIAL IMPRESSION / ASSESSMENT AND PLAN / ED COURSE  As part of my medical decision making, I reviewed the following data within the electronic MEDICAL RECORD NUMBER Radiograph reviewed  , A consult was requested and obtained from this/these consultant(s) Orthopedics, Notes from prior ED visits, and [place] Controlled Substance Database  CRITICAL CARE Performed by: Lissa HoardJenise V Bacon-Alioune Hodgkin   Total critical care time: 30 minutes  Critical care time was exclusive of separately billable procedures and  treating other patients.  Critical care was necessary to treat or prevent imminent or life-threatening deterioration.  Critical care was time spent personally by me on the following activities: development of treatment plan with patient and/or surrogate as well as nursing, discussions with consultants, evaluation of patient's response to treatment, examination of patient, obtaining history from patient or surrogate, ordering and performing treatments and interventions, ordering and review of laboratory studies, ordering and review of radiographic studies, pulse oximetry and re-evaluation of patient's condition.   DDX foot dislocation, foot fracture, foot sprain, LisFranc injury  ----------------------------------------- 5:02  PM on 02/08/2021 ----------------------------------------- S/W Dr. Adrian Prince (podiatry):  He reviewed the images and feels the case is outside of his comfort and expertise. He will consult his colleagues by phone to determine if they will accept. He later notes both his podiatry colleague and on-call ortho provider all concur that case should be transferred to Trauma service.   ----------------------------------------- 6:04 PM on 02/08/2021 ----------------------------------------- [facility] Trauma service provider aware of page out. He is in an emergent case and will call as soon as possible.   ----------------------------------------- 6:30 PM on 02/08/2021 ----------------------------------------- S/w Dr. Dwain Sarna (Trauma) he declines the case as it is not multiple trauma.   ----------------------------------------- 6:44 PM on 02/08/2021 ----------------------------------------- S/W Dr. Roda Shutters [facility]): he declines as he is not comfortable with complex case, as he is not sub-specialty trained in foot/ankle.   ----------------------------------------- 7:06 PM on 02/08/2021 ----------------------------------------- At patient request, I paged to [facility] Orth[facility] Service for  consultation. Dr. Darlyn Chamber agreed to take the patient as ED-ED transfer. She asked that he be splinted transferred emergently since he had an unstable fracture-dislocation.   Patient was placed in an OCL splint with posterior and sugar-tong components, and transported via [facility], to [facility] emergency department, in stable condition.   Makoto Sellitto was evaluated in Emergency Department on 02/08/2021 for the symptoms described in the history of present illness. He was evaluated in the context of the global COVID-19 pandemic, which necessitated consideration that the patient might be at risk for infection with the SARS-CoV-2 virus that causes COVID-19. Institutional protocols and algorithms that pertain to the evaluation of patients at risk for COVID-19 are in a state of rapid change based on information released by regulatory bodies including the CDC and federal and state organizations. These policies and algorithms were followed during the patient's care in the ED. ____________________________________________  FINAL CLINICAL IMPRESSION(S) / ED DIAGNOSES  Final diagnoses:  Foot fracture, right, closed, initial encounter  Closed displaced fracture of body of right talus, initial encounter      Lissa Hoard, PA-C 02/08/21 2349    Dionne Bucy, MD 02/09/21 [phone]

## 2021-02-08 NOTE — ED Notes (Signed)
XRAY  POWERSHARE  WITH  [facility] 

## 2021-02-08 NOTE — ED Notes (Signed)
Called [facility] to arrange [facility] to transport patient ED to ED @ [facility] waiting on arrival

## 2021-02-08 NOTE — ED Notes (Signed)
[facility]  called  per  Florala PA

## 2021-02-08 NOTE — ED Notes (Signed)
See triage note  States he fell from ladder approx 10-12 ft  Landed on his feet  Having min pain to left  Worse pain is to right foot/heel

## 2021-02-08 NOTE — ED Notes (Signed)
[facility]  TRANSFER  CALLED PER   JENISE  PA

## 2021-02-08 NOTE — ED Notes (Signed)
Resting at present   States pain has eased off

## 2021-02-08 NOTE — ED Triage Notes (Signed)
Pt comes into the ED via POV c/o fall from a ladder today.  Pt landed straight down on his feet.  Pt c/o bilateral heel pain and right ankle pain.  Pt uncomfortable in triage at this time.

## 2021-02-09 DIAGNOSIS — S93314A Dislocation of tarsal joint of right foot, initial encounter: Secondary | ICD-10-CM | POA: Insufficient documentation

## 2021-02-18 ENCOUNTER — Other Ambulatory Visit: Payer: Self-pay | Admitting: Family Medicine

## 2021-02-18 DIAGNOSIS — E785 Hyperlipidemia, unspecified: Secondary | ICD-10-CM

## 2021-02-18 NOTE — Telephone Encounter (Signed)
Requested Prescriptions  Pending Prescriptions Disp Refills  . rosuvastatin (CRESTOR) 10 MG tablet [Pharmacy Med Name: ROSUVASTATIN CALCIUM 10 MG Tablet] 90 tablet 1    Sig: TAKE 1 TABLET EVERY DAY  (DUE  FOR  BLOOD  WORK)     Cardiovascular:  Antilipid - Statins Failed - 02/18/2021  3:38 PM      Failed - LDL in normal range and within 360 days    LDL Chol Calc (NIH)  Date Value Ref Range Status  01/14/2021 100 (H) 0 - 99 mg/dL Final         Passed - Total Cholesterol in normal range and within 360 days    Cholesterol, Total  Date Value Ref Range Status  01/14/2021 184 100 - 199 mg/dL Final         Passed - HDL in normal range and within 360 days    HDL  Date Value Ref Range Status  01/14/2021 69 >39 mg/dL Final         Passed - Triglycerides in normal range and within 360 days    Triglycerides  Date Value Ref Range Status  01/14/2021 85 0 - 149 mg/dL Final         Passed - Patient is not pregnant      Passed - Valid encounter within last 12 months    Recent Outpatient Visits          1 month ago Hyperlipidemia, unspecified hyperlipidemia type   [facility] Duanne Limerick, MD   10 months ago Tenosynovitis of left foot   [facility] Duanne Limerick, MD   1 year ago Encounter for annual physical exam   [facility] Duanne Limerick, MD   1 year ago Establishing care with new doctor, encounter for   [facility] Duanne Limerick, MD

## 2021-02-19 DIAGNOSIS — J301 Allergic rhinitis due to pollen: Secondary | ICD-10-CM | POA: Diagnosis not present

## 2021-02-21 DIAGNOSIS — M7989 Other specified soft tissue disorders: Secondary | ICD-10-CM | POA: Diagnosis not present

## 2021-02-21 DIAGNOSIS — S93314D Dislocation of tarsal joint of right foot, subsequent encounter: Secondary | ICD-10-CM | POA: Diagnosis not present

## 2021-02-21 DIAGNOSIS — W11XXXD Fall on and from ladder, subsequent encounter: Secondary | ICD-10-CM | POA: Diagnosis not present

## 2021-02-21 DIAGNOSIS — S92901D Unspecified fracture of right foot, subsequent encounter for fracture with routine healing: Secondary | ICD-10-CM | POA: Diagnosis not present

## 2021-02-22 DIAGNOSIS — J45909 Unspecified asthma, uncomplicated: Secondary | ICD-10-CM

## 2021-02-22 DIAGNOSIS — J4550 Severe persistent asthma, uncomplicated: Secondary | ICD-10-CM | POA: Insufficient documentation

## 2021-02-22 HISTORY — DX: Unspecified asthma, uncomplicated: J45.909

## 2021-02-25 ENCOUNTER — Ambulatory Visit: Payer: Medicare PPO | Admitting: Gastroenterology

## 2021-02-25 DIAGNOSIS — J45909 Unspecified asthma, uncomplicated: Secondary | ICD-10-CM | POA: Diagnosis not present

## 2021-02-25 DIAGNOSIS — S92251A Displaced fracture of navicular [scaphoid] of right foot, initial encounter for closed fracture: Secondary | ICD-10-CM | POA: Diagnosis not present

## 2021-02-25 DIAGNOSIS — S92211A Displaced fracture of cuboid bone of right foot, initial encounter for closed fracture: Secondary | ICD-10-CM | POA: Diagnosis not present

## 2021-02-25 DIAGNOSIS — G8929 Other chronic pain: Secondary | ICD-10-CM | POA: Diagnosis not present

## 2021-02-25 DIAGNOSIS — Z7982 Long term (current) use of aspirin: Secondary | ICD-10-CM | POA: Diagnosis not present

## 2021-02-25 DIAGNOSIS — W11XXXA Fall on and from ladder, initial encounter: Secondary | ICD-10-CM | POA: Diagnosis not present

## 2021-02-25 DIAGNOSIS — Z87891 Personal history of nicotine dependence: Secondary | ICD-10-CM | POA: Diagnosis not present

## 2021-02-25 DIAGNOSIS — Z79899 Other long term (current) drug therapy: Secondary | ICD-10-CM | POA: Diagnosis not present

## 2021-02-25 DIAGNOSIS — S93314A Dislocation of tarsal joint of right foot, initial encounter: Secondary | ICD-10-CM | POA: Diagnosis not present

## 2021-02-25 DIAGNOSIS — G8918 Other acute postprocedural pain: Secondary | ICD-10-CM | POA: Diagnosis not present

## 2021-02-26 DIAGNOSIS — J4550 Severe persistent asthma, uncomplicated: Secondary | ICD-10-CM | POA: Diagnosis not present

## 2021-03-05 DIAGNOSIS — J301 Allergic rhinitis due to pollen: Secondary | ICD-10-CM | POA: Diagnosis not present

## 2021-03-06 DIAGNOSIS — J301 Allergic rhinitis due to pollen: Secondary | ICD-10-CM | POA: Diagnosis not present

## 2021-03-12 DIAGNOSIS — J301 Allergic rhinitis due to pollen: Secondary | ICD-10-CM | POA: Diagnosis not present

## 2021-03-14 DIAGNOSIS — S93314A Dislocation of tarsal joint of right foot, initial encounter: Secondary | ICD-10-CM | POA: Diagnosis not present

## 2021-03-14 DIAGNOSIS — S93314D Dislocation of tarsal joint of right foot, subsequent encounter: Secondary | ICD-10-CM | POA: Diagnosis not present

## 2021-03-14 DIAGNOSIS — W11XXXA Fall on and from ladder, initial encounter: Secondary | ICD-10-CM | POA: Diagnosis not present

## 2021-03-14 DIAGNOSIS — M7989 Other specified soft tissue disorders: Secondary | ICD-10-CM | POA: Diagnosis not present

## 2021-03-19 ENCOUNTER — Other Ambulatory Visit: Payer: Self-pay | Admitting: Family Medicine

## 2021-03-19 DIAGNOSIS — J4550 Severe persistent asthma, uncomplicated: Secondary | ICD-10-CM | POA: Diagnosis not present

## 2021-03-19 DIAGNOSIS — Z01818 Encounter for other preprocedural examination: Secondary | ICD-10-CM | POA: Diagnosis not present

## 2021-03-19 DIAGNOSIS — E785 Hyperlipidemia, unspecified: Secondary | ICD-10-CM

## 2021-03-19 DIAGNOSIS — Z03818 Encounter for observation for suspected exposure to other biological agents ruled out: Secondary | ICD-10-CM | POA: Diagnosis not present

## 2021-03-19 DIAGNOSIS — J301 Allergic rhinitis due to pollen: Secondary | ICD-10-CM | POA: Diagnosis not present

## 2021-03-19 MED ORDER — ROSUVASTATIN CALCIUM 10 MG PO TABS: ORAL_TABLET | ORAL | 1 refills | Status: DC

## 2021-03-19 NOTE — Telephone Encounter (Signed)
Medication Refill - Medication: rosuvastatin (CRESTOR) 10 MG tablet  Has the patient contacted their pharmacy? Yes.   0  (Agent: If yes, when and what did the pharmacy advise?) Pharmacy is calling requesting a 90 day supply   Preferred Pharmacy (with phone number or street name):   [facility] (Now [facility]) - [place], [place] - [address] Phone:  [phone]  Fax:  [phone]     Agent: Please be advised that RX refills may take up to 3 business days. We ask that you follow-up with your pharmacy.

## 2021-03-19 NOTE — Telephone Encounter (Signed)
No future visit at this time  

## 2021-03-20 DIAGNOSIS — M542 Cervicalgia: Secondary | ICD-10-CM | POA: Diagnosis not present

## 2021-03-20 DIAGNOSIS — M62838 Other muscle spasm: Secondary | ICD-10-CM | POA: Diagnosis not present

## 2021-03-20 DIAGNOSIS — M6281 Muscle weakness (generalized): Secondary | ICD-10-CM | POA: Diagnosis not present

## 2021-03-20 DIAGNOSIS — R519 Headache, unspecified: Secondary | ICD-10-CM | POA: Diagnosis not present

## 2021-03-25 DIAGNOSIS — R519 Headache, unspecified: Secondary | ICD-10-CM | POA: Diagnosis not present

## 2021-03-25 DIAGNOSIS — M6281 Muscle weakness (generalized): Secondary | ICD-10-CM | POA: Diagnosis not present

## 2021-03-25 DIAGNOSIS — M542 Cervicalgia: Secondary | ICD-10-CM | POA: Diagnosis not present

## 2021-03-25 DIAGNOSIS — M62838 Other muscle spasm: Secondary | ICD-10-CM | POA: Diagnosis not present

## 2021-03-26 DIAGNOSIS — J301 Allergic rhinitis due to pollen: Secondary | ICD-10-CM | POA: Diagnosis not present

## 2021-03-28 DIAGNOSIS — J4550 Severe persistent asthma, uncomplicated: Secondary | ICD-10-CM | POA: Diagnosis not present

## 2021-04-01 DIAGNOSIS — M62838 Other muscle spasm: Secondary | ICD-10-CM | POA: Diagnosis not present

## 2021-04-01 DIAGNOSIS — M6281 Muscle weakness (generalized): Secondary | ICD-10-CM | POA: Diagnosis not present

## 2021-04-01 DIAGNOSIS — M542 Cervicalgia: Secondary | ICD-10-CM | POA: Diagnosis not present

## 2021-04-01 DIAGNOSIS — R519 Headache, unspecified: Secondary | ICD-10-CM | POA: Diagnosis not present

## 2021-04-02 ENCOUNTER — Emergency Department
Admission: EM | Admit: 2021-04-02 | Discharge: 2021-04-02 | Disposition: A | Discharge status: Home / Self Care | Payer: Medicare PPO | Attending: Emergency Medicine | Admitting: Emergency Medicine

## 2021-04-02 ENCOUNTER — Emergency Department: Payer: Medicare PPO

## 2021-04-02 ENCOUNTER — Other Ambulatory Visit: Payer: Self-pay

## 2021-04-02 DIAGNOSIS — S92901A Unspecified fracture of right foot, initial encounter for closed fracture: Secondary | ICD-10-CM | POA: Diagnosis not present

## 2021-04-02 DIAGNOSIS — L03115 Cellulitis of right lower limb: Secondary | ICD-10-CM | POA: Insufficient documentation

## 2021-04-02 DIAGNOSIS — Z87891 Personal history of nicotine dependence: Secondary | ICD-10-CM | POA: Diagnosis not present

## 2021-04-02 DIAGNOSIS — M79671 Pain in right foot: Secondary | ICD-10-CM | POA: Diagnosis present

## 2021-04-02 DIAGNOSIS — M7989 Other specified soft tissue disorders: Secondary | ICD-10-CM | POA: Diagnosis not present

## 2021-04-02 LAB — URINALYSIS, COMPLETE (UACMP) WITH MICROSCOPIC
Bacteria, UA: NONE SEEN
Bilirubin Urine: NEGATIVE
Glucose, UA: NEGATIVE mg/dL
Hgb urine dipstick: NEGATIVE
Ketones, ur: NEGATIVE mg/dL
Leukocytes,Ua: NEGATIVE
Nitrite: NEGATIVE
Protein, ur: NEGATIVE mg/dL
Specific Gravity, Urine: 1.008 (ref 1.005–1.030)
WBC, UA: NONE SEEN WBC/hpf (ref 0–5)
pH: 6 (ref 5.0–8.0)

## 2021-04-02 LAB — CBC WITH DIFFERENTIAL/PLATELET
Abs Immature Granulocytes: 0.02 10*3/uL (ref 0.00–0.07)
Basophils Absolute: 0 10*3/uL (ref 0.0–0.1)
Basophils Relative: 1 %
Eosinophils Absolute: 0.1 10*3/uL (ref 0.0–0.5)
Eosinophils Relative: 2 %
HCT: 40.8 % (ref 39.0–52.0)
Hemoglobin: 14.4 g/dL (ref 13.0–17.0)
Immature Granulocytes: 0 %
Lymphocytes Relative: 31 %
Lymphs Abs: 1.7 10*3/uL (ref 0.7–4.0)
MCH: 33 pg (ref 26.0–34.0)
MCHC: 35.3 g/dL (ref 30.0–36.0)
MCV: 93.6 fL (ref 80.0–100.0)
Monocytes Absolute: 0.5 10*3/uL (ref 0.1–1.0)
Monocytes Relative: 8 %
Neutro Abs: 3.3 10*3/uL (ref 1.7–7.7)
Neutrophils Relative %: 58 %
Platelets: 238 10*3/uL (ref 150–400)
RBC: 4.36 MIL/uL (ref 4.22–5.81)
RDW: 12.9 % (ref 11.5–15.5)
WBC: 5.6 10*3/uL (ref 4.0–10.5)
nRBC: 0 % (ref 0.0–0.2)

## 2021-04-02 LAB — COMPREHENSIVE METABOLIC PANEL
ALT: 16 U/L (ref 0–44)
AST: 13 U/L — ABNORMAL LOW (ref 15–41)
Albumin: 4.3 g/dL (ref 3.5–5.0)
Alkaline Phosphatase: 76 U/L (ref 38–126)
Anion gap: 6 (ref 5–15)
BUN: 14 mg/dL (ref 8–23)
CO2: 22 mmol/L (ref 22–32)
Calcium: 9.3 mg/dL (ref 8.9–10.3)
Chloride: 107 mmol/L (ref 98–111)
Creatinine, Ser: 1 mg/dL (ref 0.61–1.24)
GFR, Estimated: >60 mL/min (ref >60)
Glucose, Bld: 92 mg/dL (ref 70–99)
Potassium: 4.1 mmol/L (ref 3.5–5.1)
Sodium: 135 mmol/L (ref 135–145)
Total Bilirubin: 0.9 mg/dL (ref 0.3–1.2)
Total Protein: 7.2 g/dL (ref 6.5–8.1)

## 2021-04-02 LAB — SEDIMENTATION RATE: Sed Rate: 13 mm/hr (ref 0–20)

## 2021-04-02 MED ORDER — DOXYCYCLINE MONOHYDRATE 100 MG PO TABS: 100.0000 mg | ORAL_TABLET | Freq: Two times a day (BID) | ORAL | 0 refills | Status: AC

## 2021-04-02 MED ORDER — VANCOMYCIN HCL IN DEXTROSE 1-5 GM/200ML-% IV SOLN
1000.0000 mg | Freq: Once | INTRAVENOUS | Status: AC
  Administered 2021-04-02: 1000 mg via INTRAVENOUS
  Filled 2021-04-02: qty 200

## 2021-04-02 NOTE — ED Triage Notes (Signed)
Pt to ED for poss infection to rods in right foot, placed there at end of June for broken foot.

## 2021-04-02 NOTE — Discharge Instructions (Addendum)
Take Doxycycline twice daily for ten days.  Please follow up with your orthopedic surgeon Dr. Harlow Asa as soon as possible.

## 2021-04-02 NOTE — ED Provider Notes (Signed)
  Physical Exam  BP (!) 148/79   Pulse 79   Temp 98 F (36.7 C) (Oral)   Resp 17   Ht 5\' 4"  (1.626 m)   Wt 68 kg   SpO2 98%   BMI 25.75 kg/m   Physical Exam  ED Course/Procedures     Procedures  MDM  Assumed patient care from , MD.  I reached out to Dr. Terrilee Files, orthopedist on-call for [facility] clinic regarding patient's case.  Very much appreciate time and consult.  Dr. [facility] recommended dose of IV antibiotics in the emergency department as well as outpatient oral antibiotics with follow-up with patient's orthopedic surgeon, Dr. Joice Lofts.    I was also able to speak with [facility] on-call orthopedic surgeon, Dr. [facility] who recommended outpatient antibiotics and orthopedic surgeon follow-up, Dr. Elane Fritz.  Patient was given dose of IV vancomycin in the emergency department and discharged with doxycycline.  Return precautions were given to return with new or worsening symptoms.  All patient questions were answered.       Harlow Asa Chuichu, PA-C 04/02/21 2305    [id number], MD 04/03/21 [phone]

## 2021-04-02 NOTE — ED Provider Notes (Signed)
[facility] Emergency Department Provider Note  ____________________________________________   Event Date/Time   First MD Initiated Contact with Patient 04/02/21 1929     (approximate)  I have reviewed the triage vital signs and the nursing notes.   HISTORY  Chief Complaint Wound Infection   HPI Blake Patrick is a 70 y.o. male with a past medical history of diverticulitis, migraine headaches, chronic sinusitis, tinnitus and recent right foot injury after falling off a ladder on 6/10 having initially been evaluated emergency room here but transferred to [facility] due to complexity of foot fractures (talar-navicular fracture dislocation, cuboid fracture, lateral and middle cuneiform fractures, and anterior calcaneal fractures) /p reduction and ex-fix on 6/11 and  ORIF on 6/27 who presents for assessment of some worsening throbbing pain redness and some yellow discharge from hardware sticking out of the top of his foot that he noticed in the last 24 hours.  He denies any subsequent injuries.  States he still has some residual throbbing in his right leg decree sensation in the tib-fib which seems to have been ongoing since the injury.  States he was told to come to emergency room to get checked out and he is worried he may have an infection in his foot.  He denies any fevers, chills, headache or earache, sore throat, nausea, vomiting, diarrhea, dysuria, rash or pain in any other joint or extremity.  States he did take 1 dose of Augmentin in the last 24 hours which she has prescribed for sinusitis but no other medications.  No other acute concerns at this time         Past Medical History:  Diagnosis Date   Allergy 06/07/20   Martin Majestic for Allergy Test   Arthritis    hands, fingers   Cataract    Replaced in 2019   Colon polyp    Diverticulosis    GERD (gastroesophageal reflux disease)    occasionally   History of placement of ear tubes    2012- came out shortly after    Hyperlipidemia    Migraine    daily migraines   Sinusitis    chronic   Tendonitis    both ankles   Tinnitus     Patient Active Problem List   Diagnosis Date Noted   Migraines 12/21/2017    Past Surgical History:  Procedure Laterality Date   ELBOW SURGERY Right 2007   ETHMOIDECTOMY Bilateral 10/27/2019   Procedure: TOTAL ETHMOIDECTOMY WITH FRONTAL SINUOTOMY;  Surgeon: Margaretha Sheffield, MD;  Location: [place];  Service: ENT;  Laterality: Bilateral;   EYE SURGERY  2019   Replaced   HAND [place] Bilateral 10/27/2019   Procedure: IMAGE GUIDED SINUS SURGERY;  Surgeon: Margaretha Sheffield, MD;  Location: [place];  Service: ENT;  Laterality: Bilateral;  need stryker disk placed disk on or charge nurse desk 2-17   [place] Bilateral 04/26/2020   Procedure: IMAGE [place];  Surgeon: Margaretha Sheffield, MD;  Location: [place];  Service: ENT;  Laterality: Bilateral;  need stryker disk gave Hassan Rowan disk on 8-23 kp   MENISCUS REPAIR Left 2018   SEPTOPLASTY Bilateral 10/27/2019   Procedure: SEPTOPLASTY REVISION;  Surgeon: Margaretha Sheffield, MD;  Location: [place];  Service: ENT;  Laterality: Bilateral;   SPHENOIDECTOMY Right 04/26/2020   Procedure: Coralee Pesa;  Surgeon: Margaretha Sheffield, MD;  Location: [place];  Service: ENT;  Laterality: Right;   TONSILLECTOMY  1964    Prior to Admission medications   Medication Sig Start Date End Date Taking? Authorizing Provider  APPLE CIDER VINEGAR PO Take 500 mg by mouth.    [provider]  budesonide (PULMICORT) 0.5 MG/2ML nebulizer solution Inhale into the lungs. 11/14/20 11/14/21  [provider]  Calcium Carbonate-Vitamin D (RA CALCIUM PLUS VITAMIN D PO) Take 525 mg by mouth 2 (two) times daily.    [provider]  Cholecalciferol (VITAMIN D) 50 MCG (2000 UT) CAPS Take 2 capsules by mouth  daily.    [provider]  EPINEPHrine 0.3 mg/0.3 mL IJ SOAJ injection  06/13/20   [provider]  escitalopram (LEXAPRO) 10 MG tablet  06/02/20   [provider]  fexofenadine (ALLEGRA) 180 MG tablet Take 180 mg by mouth daily.    [provider]  fluticasone (FLONASE) 50 MCG/ACT nasal spray Place 2 sprays into both nostrils daily. 04/16/20   [provider]  gabapentin (NEURONTIN) 300 MG capsule Take 2 capsules by mouth 3 (three) times daily. neurology 12/17/20 12/17/21  [provider]  Glucosamine-Chondroitin (GLUCOSAMINE CHONDR COMPLEX PO) Take 3,700 mg by mouth daily.    [provider]  ibuprofen (ADVIL) 600 MG tablet Take 600 mg by mouth every 6 (six) hours as needed.    [provider]  ipratropium-albuterol (DUONEB) 0.5-2.5 (3) MG/3ML SOLN Inhale into the lungs. 11/14/20 11/09/21  [provider]  Lasmiditan Succinate 100 MG TABS Take by mouth. neuro 10/15/20   [provider]  naratriptan (AMERGE) 2.5 MG tablet  05/24/20   [provider]  polycarbophil (FIBERCON) 625 MG tablet Take 1,250 mg by mouth 2 (two) times daily.    [provider]  Riboflavin 400 MG TABS Take 1 tablet by mouth daily.    [provider]  rosuvastatin (CRESTOR) 10 MG tablet TAKE 1 TABLET EVERY DAY  (DUE  FOR  BLOOD  WORK) 03/19/21   Juline Patch, MD  vitamin C (ASCORBIC ACID) 500 MG tablet Take 500 mg by mouth daily.    [provider]  Vitamin E 180 MG CAPS Take 1 capsule by mouth daily.    [provider]  Vitamins-Lipotropics (LIPO-FLAVONOID PLUS PO) Take 1,000 mg by mouth 2 (two) times daily.    [provider]  Zinc 50 MG CAPS Take 1 capsule by mouth daily.    [provider]    Allergies Patient has no known allergies.  Family History  Problem Relation Age of Onset   Cancer Father     Social History Social History   Tobacco Use   Smoking status:  Former    Packs/day: 1.00    Years: 26.00    Pack years: 26.00    Types: Cigarettes    Quit date: 09/01/1988    Years since quitting: 32.6   Smokeless tobacco: Never  Substance Use Topics   Alcohol use: Yes    Alcohol/week: 7.0 standard drinks    Types: 7 Cans of beer per week    Comment: Social drinking only   Drug use: Never    Review of Systems  Review of Systems  Constitutional:  Negative for chills and fever.  HENT:  Negative for sore throat.   Eyes:  Negative for pain.  Respiratory:  Negative for cough and stridor.   Cardiovascular:  Negative for chest pain.  Gastrointestinal:  Negative for vomiting.  Genitourinary:  Negative for dysuria.  Musculoskeletal:  Positive for joint pain (R ankle, R foot) and myalgias (R foot).  Skin:  Negative for rash.  Neurological:  Negative for seizures, loss of consciousness and headaches.  Psychiatric/Behavioral:  Negative for suicidal ideas.   All other systems reviewed and are negative.    ____________________________________________   PHYSICAL EXAM:  VITAL SIGNS: ED Triage Vitals  Enc Vitals Group     BP 04/02/21 1826 (!) 155/87     Pulse Rate 04/02/21 1825 77     Resp 04/02/21 1825 18     Temp 04/02/21 1825 98 F (36.7 C)     Temp Source 04/02/21 1825 Oral     SpO2 04/02/21 1825 95 %     Weight 04/02/21 1827 150 lb (68 kg)     Height 04/02/21 1827 '5\' 4"'  (1.626 m)     Head Circumference --      Peak Flow --      Pain Score 04/02/21 1826 2     Pain Loc --      Pain Edu? --      Excl. in [place]? --    Vitals:   04/02/21 1825 04/02/21 1826  BP:  (!) 155/87  Pulse: 77   Resp: 18   Temp: 98 F (36.7 C)   SpO2: 95%    Physical Exam Vitals and nursing note reviewed.  Constitutional:      Appearance: He is well-developed.  HENT:     Head: Normocephalic and atraumatic.     Right Ear: External ear normal.     Left Ear: External ear normal.     Nose: Nose normal.  Eyes:     Conjunctiva/sclera: Conjunctivae normal.   Cardiovascular:     Rate and Rhythm: Normal rate and regular rhythm.     Heart sounds: No murmur heard. Pulmonary:     Effort: Pulmonary effort is normal. No respiratory distress.     Breath sounds: Normal breath sounds.  Abdominal:     Palpations: Abdomen is soft.     Tenderness: There is no abdominal tenderness. There is no right CVA tenderness or left CVA tenderness.  Musculoskeletal:     Cervical back: Neck supple.  Skin:    General: Skin is warm and dry.  Neurological:     Mental Status: He is alert and oriented to person, place, and time.  Psychiatric:        Mood and Affect: Mood normal.    Patient has 2 visible pieces of metal hardware protruding from the dorsum of his right foot.  There is surrounding erythema edema warmth and tenderness.  He is able to move his toes on command.  Sensation is intact to light touch of the dorsum of the foot.  Erythema is extending towards ankle but there is no significant extension past this to the tibia-fibula.  He does have some range of motion at the right ankle and edema but no significant effusion.  Left foot and ankle is unremarkable.  Both knees are unremarkable. ____________________________________________   LABS (all labs ordered are listed, but only abnormal results are displayed)  Labs Reviewed  COMPREHENSIVE METABOLIC PANEL - Abnormal; Notable for the following components:      Result Value   AST 13 (*)    All other components within normal limits  CBC WITH DIFFERENTIAL/PLATELET  SEDIMENTATION RATE  URINALYSIS, COMPLETE (UACMP) WITH MICROSCOPIC  C-REACTIVE PROTEIN   ____________________________________________  EKG  ____________________________________________  RADIOLOGY  ED MD interpretation: Plain film of the right  foot shows interval surgical fixation of several fractures and some soft tissue swelling.  No evidence of acute osteomyelitis.  Official radiology report(s): DG Foot Complete Right  Result Date:  04/02/2021 CLINICAL DATA:  Foot pain possible infection EXAM: RIGHT FOOT COMPLETE - 3+ VIEW COMPARISON:  02/08/2021 FINDINGS: Interval screw fixation of navicular bone with additional fixating wires extending anterior to posterior from the base of the first metatarsal to the talus bone. Generalized soft tissue swelling. Hardware appears grossly intact. No definite osseous destructive change. IMPRESSION: Interval surgical fixation of multiple foot fractures with generalized soft tissue swelling. No definite acute osseous abnormality Electronically Signed   By: Donavan Foil M.D.   On: 04/02/2021 19:56    ____________________________________________   PROCEDURES  Procedure(s) performed (including Critical Care):  Procedures   ____________________________________________   INITIAL IMPRESSION / ASSESSMENT AND PLAN / ED COURSE      Patient presents with above to history exam for assessment of acute onset of redness pain swelling and a little bit of yellow discharge from the site of his surgical fixation on the right foot.  On arrival he is still hypertensive would be of 155/87 with stable vital signs on room air.  On exam he does have some erythema tenderness and warmth on the dorsum of the right foot extending towards ankle with a little bit of purulent drainage from the hardware protruding from the dorsum of the foot.  Differential includes cellulitis, osteomyelitis, possible hardware infection.  Lower suspicion for a septic joint at this time however bacteremia as patient has no fever, leukocytosis and nonelevated ESR.  CBC in addition shows no acute anemia and normal platelets.  CMP shows no significant electrolyte or metabolic derangements.  Plain film of the right foot shows interval surgical fixation of several fractures and some soft tissue swelling.  No evidence of acute osteomyelitis.  Suspect likely cellulitis although somewhat difficult to exclude infection extending around hardware.   Injury to reach out to [place] orthopedic service for assistance in arranging follow-up tomorrow as I do not think patient needs to necessarily be hospitalized for IV antibiotics at this time.  At the very least I think you will likely need to be started on antibiotics to cover for MRSA.  Care patient signed over to [place], [place] at approximately 9 PM.  Patient is pending callback from [place] for any additional recommendations.     ____________________________________________   FINAL CLINICAL IMPRESSION(S) / ED DIAGNOSES  Final diagnoses:  Cellulitis of right foot    Medications - No data to display   ED Discharge Orders     None        Note:  This document was prepared using Dragon voice recognition software and may include unintentional dictation errors.    Lucrezia Starch, MD 04/02/21 2056

## 2021-04-08 ENCOUNTER — Ambulatory Visit: Payer: Medicare PPO | Attending: Internal Medicine

## 2021-04-08 ENCOUNTER — Other Ambulatory Visit (HOSPITAL_BASED_OUTPATIENT_CLINIC_OR_DEPARTMENT_OTHER): Payer: Self-pay

## 2021-04-08 DIAGNOSIS — Z23 Encounter for immunization: Secondary | ICD-10-CM

## 2021-04-08 DIAGNOSIS — R519 Headache, unspecified: Secondary | ICD-10-CM | POA: Diagnosis not present

## 2021-04-08 DIAGNOSIS — M62838 Other muscle spasm: Secondary | ICD-10-CM | POA: Diagnosis not present

## 2021-04-08 DIAGNOSIS — M6281 Muscle weakness (generalized): Secondary | ICD-10-CM | POA: Diagnosis not present

## 2021-04-08 DIAGNOSIS — M542 Cervicalgia: Secondary | ICD-10-CM | POA: Diagnosis not present

## 2021-04-08 MED ORDER — COVID-19 MRNA VAC-TRIS(PFIZER) 30 MCG/0.3ML IM SUSP
INTRAMUSCULAR | 0 refills | Status: DC
  Filled 2021-04-08: qty 0.3, 1d supply, fill #0

## 2021-04-08 NOTE — Progress Notes (Signed)
   Covid-19 Vaccination Clinic  Name:  Rollo Farquhar    MRN: [id number] DOB: [birth year]  04/08/2021  Mr. Hathorne was observed post Covid-19 immunization for 15 minutes without incident. He was provided with Vaccine Information Sheet and instruction to access the V-Safe system.   Mr. Fudala was instructed to call 911 with any severe reactions post vaccine: Difficulty breathing  Swelling of face and throat  A fast heartbeat  A bad rash all over body  Dizziness and weakness   Immunizations Administered     Name Date Dose VIS Date Route   PFIZER Comrnaty(Gray TOP) Covid-19 Vaccine 04/08/2021 11:57 AM 0.3 mL 08/09/2020 Intramuscular   Manufacturer: [employer], [employer]   Lot: [id number]   NDC: [phone]

## 2021-04-09 DIAGNOSIS — J301 Allergic rhinitis due to pollen: Secondary | ICD-10-CM | POA: Diagnosis not present

## 2021-04-10 DIAGNOSIS — R519 Headache, unspecified: Secondary | ICD-10-CM | POA: Diagnosis not present

## 2021-04-10 DIAGNOSIS — M542 Cervicalgia: Secondary | ICD-10-CM | POA: Diagnosis not present

## 2021-04-10 DIAGNOSIS — M62838 Other muscle spasm: Secondary | ICD-10-CM | POA: Diagnosis not present

## 2021-04-10 DIAGNOSIS — M6281 Muscle weakness (generalized): Secondary | ICD-10-CM | POA: Diagnosis not present

## 2021-04-11 DIAGNOSIS — R9389 Abnormal findings on diagnostic imaging of other specified body structures: Secondary | ICD-10-CM | POA: Diagnosis not present

## 2021-04-11 DIAGNOSIS — M7731 Calcaneal spur, right foot: Secondary | ICD-10-CM | POA: Diagnosis not present

## 2021-04-11 DIAGNOSIS — W11XXXA Fall on and from ladder, initial encounter: Secondary | ICD-10-CM | POA: Diagnosis not present

## 2021-04-11 DIAGNOSIS — S93314D Dislocation of tarsal joint of right foot, subsequent encounter: Secondary | ICD-10-CM | POA: Diagnosis not present

## 2021-04-11 DIAGNOSIS — S93314A Dislocation of tarsal joint of right foot, initial encounter: Secondary | ICD-10-CM | POA: Diagnosis not present

## 2021-04-15 DIAGNOSIS — R519 Headache, unspecified: Secondary | ICD-10-CM | POA: Diagnosis not present

## 2021-04-15 DIAGNOSIS — M6281 Muscle weakness (generalized): Secondary | ICD-10-CM | POA: Diagnosis not present

## 2021-04-15 DIAGNOSIS — M62838 Other muscle spasm: Secondary | ICD-10-CM | POA: Diagnosis not present

## 2021-04-15 DIAGNOSIS — M542 Cervicalgia: Secondary | ICD-10-CM | POA: Diagnosis not present

## 2021-04-16 DIAGNOSIS — J301 Allergic rhinitis due to pollen: Secondary | ICD-10-CM | POA: Diagnosis not present

## 2021-04-22 DIAGNOSIS — R519 Headache, unspecified: Secondary | ICD-10-CM | POA: Diagnosis not present

## 2021-04-23 DIAGNOSIS — J301 Allergic rhinitis due to pollen: Secondary | ICD-10-CM | POA: Diagnosis not present

## 2021-04-25 ENCOUNTER — Ambulatory Visit: Payer: Medicare PPO | Admitting: Gastroenterology

## 2021-04-25 ENCOUNTER — Other Ambulatory Visit: Payer: Self-pay

## 2021-04-25 DIAGNOSIS — R1319 Other dysphagia: Secondary | ICD-10-CM

## 2021-04-25 NOTE — Progress Notes (Signed)
Blake Patrick [address]  [address]  [place], [place] [address]  Main: [phone]  Fax: [phone]   Gastroenterology Consultation  Referring Provider:     Duanne Limerick, MD Primary Care Physician:  Duanne Limerick, MD Reason for Consultation:     Dysphagia        HPI:    Chief Complaint  Patient presents with   New Patient (Initial Visit)    Denies family Hx   Dysphagia    X 2-3 years intermittent... last episode lasted a bit longer... feeling of food getting stuck epigastric region... denies GERD Sx, burning or regurgitation... Denies N/V/D... Normal 1-2 BM daily... Denies adb pain or bloating    Blake Patrick is a 70 y.o. y/o male referred for consultation & management  by Dr. Duanne Limerick, MD. patient reports 1 year history of intermittent dysphagia, to solid foods only.  Feels food gets stuck at the xiphoid notch, and is associated with pain when this occurs.  He stated about 1 to 2 months ago, he had this symptom daily for about 1 month and since then it has resolved.  When symptoms occur, he swallows water and this helps the food go down.  No dysphagia to liquids.  No episodes of food impaction.  Recalls having an EGD when he was a child, but not since then.  Colonoscopy up-to-date.  No weight loss.  No altered bowel habits.  No abdominal pain, nausea or vomiting.  Past Medical History:  Diagnosis Date   Allergy 06/07/20   Went for Allergy Test   Arthritis    hands, fingers   Cataract    Replaced in 2019   Colon polyp    Diverticulosis    GERD (gastroesophageal reflux disease)    occasionally   History of placement of ear tubes    2012- came out shortly after   Hyperlipidemia    Migraine    daily migraines   Sinusitis    chronic   Tendonitis    both ankles   Tinnitus     Past Surgical History:  Procedure Laterality Date   ELBOW SURGERY Right 2007   ETHMOIDECTOMY Bilateral 10/27/2019   Procedure: TOTAL ETHMOIDECTOMY WITH FRONTAL  SINUOTOMY;  Surgeon: Vernie Murders, MD;  Location: [facility];  Service: ENT;  Laterality: Bilateral;   EYE SURGERY  2019   Replaced   HAND SURGERY  1983   HERNIA REPAIR  1995   IMAGE GUIDED SINUS SURGERY Bilateral 10/27/2019   Procedure: IMAGE GUIDED SINUS SURGERY;  Surgeon: Vernie Murders, MD;  Location: [facility];  Service: ENT;  Laterality: Bilateral;  need stryker disk placed disk on or charge nurse desk 2-17   kp   IMAGE GUIDED SINUS SURGERY Bilateral 04/26/2020   Procedure: IMAGE GUIDED SINUS SURGERY;  Surgeon: Vernie Murders, MD;  Location: [facility];  Service: ENT;  Laterality: Bilateral;  need stryker disk gave Steward Drone disk on 8-23 kp   MENISCUS REPAIR Left 2018   SEPTOPLASTY Bilateral 10/27/2019   Procedure: SEPTOPLASTY REVISION;  Surgeon: Vernie Murders, MD;  Location: [facility];  Service: ENT;  Laterality: Bilateral;   SPHENOIDECTOMY Right 04/26/2020   Procedure: [place];  Surgeon: Vernie Murders, MD;  Location: [facility];  Service: ENT;  Laterality: Right;   TONSILLECTOMY  1964    Prior to Admission medications   Medication Sig Start Date End Date Taking? Authorizing Provider  APPLE CIDER VINEGAR PO Take 500 mg by mouth.  Yes [provider]  budesonide (PULMICORT) 0.5 MG/2ML nebulizer solution Inhale into the lungs. 11/14/20 11/14/21 Yes [provider]  Calcium Carbonate-Vitamin D (RA CALCIUM PLUS VITAMIN D PO) Take 525 mg by mouth 2 (two) times daily.   Yes [provider]  Cholecalciferol (VITAMIN D) 50 MCG (2000 UT) CAPS Take 2 capsules by mouth daily.   Yes [provider]  COVID-19 mRNA Vac-TriS, Pfizer, SUSP injection Inject into the muscle. 04/08/21  Yes Judyann Munson, MD  EPINEPHrine 0.3 mg/0.3 mL IJ SOAJ injection  06/13/20  Yes [provider]  fexofenadine (ALLEGRA) 180 MG tablet Take 180 mg by mouth daily.   Yes [provider]  fluticasone (FLONASE) 50 MCG/ACT  nasal spray Place 2 sprays into both nostrils daily. 04/16/20  Yes [provider]  gabapentin (NEURONTIN) 300 MG capsule Take 2 capsules by mouth 3 (three) times daily. neurology 12/17/20 12/17/21 Yes [provider]  Glucosamine-Chondroitin (GLUCOSAMINE CHONDR COMPLEX PO) Take 3,700 mg by mouth daily.   Yes [provider]  ibuprofen (ADVIL) 600 MG tablet Take 600 mg by mouth every 6 (six) hours as needed.   Yes [provider]  ipratropium-albuterol (DUONEB) 0.5-2.5 (3) MG/3ML SOLN Inhale into the lungs. 11/14/20 11/09/21 Yes [provider]  Lasmiditan Succinate 100 MG TABS Take by mouth. neuro 10/15/20  Yes [provider]  naratriptan (AMERGE) 2.5 MG tablet  05/24/20  Yes [provider]  polycarbophil (FIBERCON) 625 MG tablet Take 1,250 mg by mouth 2 (two) times daily.   Yes [provider]  potassium chloride (KLOR-CON) 10 MEQ tablet Take by mouth. 04/22/21 04/22/22 Yes [provider]  Riboflavin 400 MG TABS Take 1 tablet by mouth daily.   Yes [provider]  rosuvastatin (CRESTOR) 10 MG tablet TAKE 1 TABLET EVERY DAY  (DUE  FOR  BLOOD  WORK) 03/19/21  Yes Duanne Limerick, MD  vitamin C (ASCORBIC ACID) 500 MG tablet Take 500 mg by mouth daily.   Yes [provider]  Vitamin E 180 MG CAPS Take 1 capsule by mouth daily.   Yes [provider]  Vitamins-Lipotropics (LIPO-FLAVONOID PLUS PO) Take 1,000 mg by mouth 2 (two) times daily.   Yes [provider]  Zinc 50 MG CAPS Take 1 capsule by mouth daily.   Yes [provider]    Family History  Problem Relation Age of Onset   Cancer Father      Social History   Tobacco Use   Smoking status: Former    Packs/day: 1.00    Years: 26.00    Pack years: 26.00    Types: Cigarettes    Quit date: 09/01/1988    Years since quitting: 32.6   Smokeless tobacco: Never  Substance Use Topics   Alcohol use: Yes    Alcohol/week: 7.0  standard drinks    Types: 7 Cans of beer per week    Comment: Social drinking only   Drug use: Never    Allergies as of 04/25/2021   (No Known Allergies)    Review of Systems:    All systems reviewed and negative except where noted in HPI.   Physical Exam:  Constitutional: General:   Alert,  Well-developed, well-nourished, pleasant and cooperative in NAD There were no vitals taken for this visit.  Eyes:  Sclera clear, no icterus.   Conjunctiva pink. PERRLA  Ears:  No scars, lesions or masses, Normal auditory acuity. Nose:  No deformity, discharge, or lesions. Mouth:  No deformity or lesions, oropharynx pink & moist.  Neck:  Supple; no masses or thyromegaly.  Respiratory: Normal respiratory effort, Normal percussion  Gastrointestinal: Soft, non-tender and non-distended without masses, hepatosplenomegaly or hernias noted.  No guarding or rebound tenderness.     Cardiac: No clubbing or edema in left lower extremity.  Right lower extremity in brace..  No cyanosis. Normal posterior tibial pedal pulses left lower extremity noted.  Lymphatic:  No significant cervical or axillary adenopathy.  Psych:  Alert and cooperative. Normal mood and affect.  Musculoskeletal:  Normal gait. Head normocephalic, atraumatic. Symmetrical without gross deformities. 5/5 Upper and Lower extremity strength bilaterally.  Skin: Warm. Intact without significant lesions or rashes. No jaundice.  Neurologic:  Face symmetrical, tongue midline, Normal sensation to touch;  grossly normal neurologically.  Psych:  Alert and oriented x3, Alert and cooperative. Normal mood and affect.   Labs: CBC    Component Value Date/Time   WBC 5.6 04/02/2021 1829   RBC 4.36 04/02/2021 1829   HGB 14.4 04/02/2021 1829   HCT 40.8 04/02/2021 1829   PLT 238 04/02/2021 1829   MCV 93.6 04/02/2021 1829   MCH 33.0 04/02/2021 1829   MCHC 35.3 04/02/2021 1829   RDW 12.9 04/02/2021 1829   LYMPHSABS 1.7 04/02/2021 1829    MONOABS 0.5 04/02/2021 1829   EOSABS 0.1 04/02/2021 1829   BASOSABS 0.0 04/02/2021 1829   CMP     Component Value Date/Time   NA 135 04/02/2021 1829   NA 139 11/03/2019 0945   K 4.1 04/02/2021 1829   CL 107 04/02/2021 1829   CO2 22 04/02/2021 1829   GLUCOSE 92 04/02/2021 1829   BUN 14 04/02/2021 1829   BUN 16 11/03/2019 0945   CREATININE 1.00 04/02/2021 1829   CALCIUM 9.3 04/02/2021 1829   PROT 7.2 04/02/2021 1829   PROT 6.7 01/14/2021 1159   ALBUMIN 4.3 04/02/2021 1829   ALBUMIN 4.6 01/14/2021 1159   AST 13 (L) 04/02/2021 1829   ALT 16 04/02/2021 1829   ALKPHOS 76 04/02/2021 1829   BILITOT 0.9 04/02/2021 1829   BILITOT 0.5 01/14/2021 1159   GFRNONAA >60 04/02/2021 1829   GFRAA 72 11/03/2019 0945    Imaging Studies: No results found.  Assessment and Plan:   Blake Patrick is a 70 y.o. y/o male has been referred for dysphagia to solid foods  EGD indicated for evaluation of Schatzki's ring, EOE, rule out obstruction/stricture/malignancy.  Possible dilation if stricture is present  I have discussed alternative options, risks & benefits,  which include, but are not limited to, bleeding, infection, perforation,respiratory complication & drug reaction.  The patient agrees with this plan & written consent will be obtained.    Patient recently visited the ER in August 2022, due to a right foot fracture.  Antibiotics were recommended.  These notes were reviewed and patient states he has completed his antibiotics and he does not want any further antibiotics.  Patient states he was sent to [facility] when he visited the ER here.  ER notes here and [facility]opedic records from 04/11/2021 reviewed and patient was diagnosed with close dislocation of tarsal joint of right foot, he underwent fixation, and was recommended to be nonweightbearing for 12 weeks.  Most recent labs, CBC CMP reviewed above and reassuring.  No elevation in white count.  No contraindications to proceed with EGD at this  time    Dr Blake Patrick  Speech recognition software was used to dictate the above note.

## 2021-04-26 ENCOUNTER — Encounter: Payer: Self-pay | Admitting: Gastroenterology

## 2021-04-26 DIAGNOSIS — M6281 Muscle weakness (generalized): Secondary | ICD-10-CM | POA: Diagnosis not present

## 2021-04-26 DIAGNOSIS — M62838 Other muscle spasm: Secondary | ICD-10-CM | POA: Diagnosis not present

## 2021-04-26 DIAGNOSIS — M542 Cervicalgia: Secondary | ICD-10-CM | POA: Diagnosis not present

## 2021-04-26 DIAGNOSIS — R519 Headache, unspecified: Secondary | ICD-10-CM | POA: Diagnosis not present

## 2021-04-28 DIAGNOSIS — J4550 Severe persistent asthma, uncomplicated: Secondary | ICD-10-CM | POA: Diagnosis not present

## 2021-04-29 ENCOUNTER — Ambulatory Visit: Payer: Medicare PPO | Admitting: Anesthesiology

## 2021-04-29 ENCOUNTER — Ambulatory Visit
Admission: RE | Admit: 2021-04-29 | Discharge: 2021-04-29 | Disposition: A | Discharge status: Home / Self Care | Payer: Medicare PPO | Attending: Gastroenterology | Admitting: Gastroenterology

## 2021-04-29 ENCOUNTER — Encounter
Admission: RE | Disposition: A | Discharge status: Home / Self Care | Payer: Self-pay | Source: Home / Self Care | Attending: Gastroenterology

## 2021-04-29 ENCOUNTER — Other Ambulatory Visit: Payer: Self-pay

## 2021-04-29 DIAGNOSIS — K3189 Other diseases of stomach and duodenum: Secondary | ICD-10-CM | POA: Insufficient documentation

## 2021-04-29 DIAGNOSIS — K2100 Gastro-esophageal reflux disease with esophagitis, without bleeding: Secondary | ICD-10-CM | POA: Diagnosis not present

## 2021-04-29 DIAGNOSIS — Z87891 Personal history of nicotine dependence: Secondary | ICD-10-CM | POA: Diagnosis not present

## 2021-04-29 DIAGNOSIS — K449 Diaphragmatic hernia without obstruction or gangrene: Secondary | ICD-10-CM | POA: Insufficient documentation

## 2021-04-29 DIAGNOSIS — Z8719 Personal history of other diseases of the digestive system: Secondary | ICD-10-CM | POA: Diagnosis not present

## 2021-04-29 DIAGNOSIS — K222 Esophageal obstruction: Secondary | ICD-10-CM | POA: Insufficient documentation

## 2021-04-29 DIAGNOSIS — R1310 Dysphagia, unspecified: Secondary | ICD-10-CM | POA: Diagnosis not present

## 2021-04-29 DIAGNOSIS — Z809 Family history of malignant neoplasm, unspecified: Secondary | ICD-10-CM | POA: Diagnosis not present

## 2021-04-29 DIAGNOSIS — K209 Esophagitis, unspecified without bleeding: Secondary | ICD-10-CM

## 2021-04-29 DIAGNOSIS — K2289 Other specified disease of esophagus: Secondary | ICD-10-CM | POA: Insufficient documentation

## 2021-04-29 DIAGNOSIS — Z7951 Long term (current) use of inhaled steroids: Secondary | ICD-10-CM | POA: Insufficient documentation

## 2021-04-29 DIAGNOSIS — R1319 Other dysphagia: Secondary | ICD-10-CM | POA: Diagnosis not present

## 2021-04-29 DIAGNOSIS — E785 Hyperlipidemia, unspecified: Secondary | ICD-10-CM | POA: Diagnosis not present

## 2021-04-29 HISTORY — PX: BIOPSY: SHX5522

## 2021-04-29 HISTORY — PX: ESOPHAGOGASTRODUODENOSCOPY (EGD) WITH PROPOFOL: SHX5813

## 2021-04-29 SURGERY — ESOPHAGOGASTRODUODENOSCOPY (EGD) WITH PROPOFOL
Anesthesia: General | Site: Esophagus

## 2021-04-29 MED ORDER — GLYCOPYRROLATE 0.2 MG/ML IJ SOLN
INTRAMUSCULAR | Status: DC | PRN
  Administered 2021-04-29: .1 mg via INTRAVENOUS

## 2021-04-29 MED ORDER — LACTATED RINGERS IV SOLN: INTRAVENOUS | Status: DC

## 2021-04-29 MED ORDER — STERILE WATER FOR IRRIGATION IR SOLN
Status: DC | PRN
  Administered 2021-04-29: 25 mL

## 2021-04-29 MED ORDER — PROPOFOL 10 MG/ML IV BOLUS
INTRAVENOUS | Status: DC | PRN
  Administered 2021-04-29: 10 mg via INTRAVENOUS
  Administered 2021-04-29 (×3): 20 mg via INTRAVENOUS
  Administered 2021-04-29: 30 mg via INTRAVENOUS
  Administered 2021-04-29 (×2): 20 mg via INTRAVENOUS
  Administered 2021-04-29: 80 mg via INTRAVENOUS
  Administered 2021-04-29: 20 mg via INTRAVENOUS

## 2021-04-29 MED ORDER — ONDANSETRON HCL 4 MG/2ML IJ SOLN: 4.0000 mg | Freq: Once | INTRAMUSCULAR | Status: DC | PRN

## 2021-04-29 MED ORDER — OMEPRAZOLE 20 MG PO CPDR: DELAYED_RELEASE_CAPSULE | ORAL | 0 refills | Status: DC

## 2021-04-29 MED ORDER — ACETAMINOPHEN 10 MG/ML IV SOLN: 1000.0000 mg | Freq: Once | INTRAVENOUS | Status: DC | PRN

## 2021-04-29 MED ORDER — SODIUM CHLORIDE 0.9 % IV SOLN: INTRAVENOUS | Status: DC

## 2021-04-29 MED ORDER — LIDOCAINE HCL (CARDIAC) PF 100 MG/5ML IV SOSY
PREFILLED_SYRINGE | INTRAVENOUS | Status: DC | PRN
Start: 2021-04-29 — End: 2021-04-29
  Administered 2021-04-29: 50 mg via INTRAVENOUS

## 2021-04-29 SURGICAL SUPPLY — 11 items
BALLN DILATOR 12-15 8 (BALLOONS) ×2
BALLOON DILATOR 12-15 8 (BALLOONS) ×1 IMPLANT
BLOCK BITE 60FR ADLT L/F GRN (MISCELLANEOUS) ×2 IMPLANT
FORCEPS BIOP RAD 4 LRG CAP 4 (CUTTING FORCEPS) ×2 IMPLANT
GOWN CVR UNV OPN BCK APRN NK (MISCELLANEOUS) ×2 IMPLANT
GOWN ISOL THUMB LOOP REG UNIV (MISCELLANEOUS) ×4
KIT PRC NS LF DISP ENDO (KITS) ×1 IMPLANT
KIT PROCEDURE OLYMPUS (KITS) ×2
MANIFOLD NEPTUNE II (INSTRUMENTS) ×2 IMPLANT
SYR INFLATION 60ML (SYRINGE) ×2 IMPLANT
WATER STERILE IRR 250ML POUR (IV SOLUTION) ×2 IMPLANT

## 2021-04-29 NOTE — Op Note (Signed)
[facility] Gastroenterology Patient Name: Blake RampJames Buzan Procedure Date: 04/29/2021 1:24 PM MRN: [id number] Account #: [id number] Date of Birth: Nov 15, 1950 Admit Type: Outpatient Age: 70 Room: [facility] OR ROOM 01 Gender: Male Note Status: Finalized Procedure:             Upper GI endoscopy Indications:           Dysphagia Providers:             Daina Cara B. Maximino Greenlandahiliani MD, MD Referring MD:          Duanne Limerickeanna C. Jones, MD (Referring MD) Medicines:             Monitored Anesthesia Care Complications:         No immediate complications. Procedure:             Pre-Anesthesia Assessment:                        - The risks and benefits of the procedure and the                         sedation options and risks were discussed with the                         patient. All questions were answered and informed                         consent was obtained.                        - Patient identification and proposed procedure were                         verified prior to the procedure.                        - ASA Grade Assessment: II - A patient with mild                         systemic disease.                        After obtaining informed consent, the endoscope was                         passed under direct vision. Throughout the procedure,                         the patient's blood pressure, pulse, and oxygen                         saturations were monitored continuously. The was                         introduced through the mouth, and advanced to the                         second part of duodenum. The upper GI endoscopy was                         accomplished with ease. The  patient tolerated the                         procedure well. Findings:      LA Grade B (one or more mucosal breaks greater than 5 mm, not extending       between the tops of two mucosal folds) esophagitis with no bleeding was       found in the distal esophagus.      Tongues of salmon-colored mucosa  were present. Biopsies were not done       due to presence of esophagitis (as this can influence histology and       biopsies are best done once esophagitis is treated).      One benign-appearing, intrinsic moderate stenosis was found at the       gastroesophageal junction. The stenosis was traversed. A TTS dilator was       passed through the scope. Dilation with a 12-13.5-15 mm balloon dilator       was performed to 15 mm. The dilation site was examined and showed       complete resolution of luminal narrowing and no bleeding, mucosal tear       or perforation.      White nummular lesions were noted in the distal esophagus. Biopsies were       obtained from the proximal and distal esophagus with cold forceps for       histology of suspected eosinophilic esophagitis. Biopsies were done for       candida and EoE. Biopsies were done away from and proximal to the       dilation site. Biopsies were performed post dilation to prevent       complications at the dilation site.      The exam of the esophagus was otherwise normal.      A 2 cm hiatal hernia was present.      Normal mucosa was found in the entire examined stomach. Biopsies were       obtained in the gastric body, at the incisura and in the gastric antrum       with cold forceps for histology.      Patchy mildly erythematous mucosa without active bleeding and with no       stigmata of bleeding was found in the duodenal bulb.      The second portion of the duodenum was normal. Impression:            - LA Grade B reflux esophagitis with no bleeding.                        - Salmon-colored mucosa.                        - Benign-appearing esophageal stenosis. Dilated.                        - White nummular lesions in esophageal mucosa.                         Biopsied.                        - 2 cm hiatal hernia.                        -  Normal mucosa was found in the entire stomach.                        - Erythematous  duodenopathy.                        - Normal second portion of the duodenum.                        - Biopsies were obtained in the gastric body, at the                         incisura and in the gastric antrum. Recommendation:        - Await pathology results.                        - Repeat upper endoscopy in 2-3 months for biopsies or                         salmon colored mucosa, and possible further dilation.                        - Follow an antireflux regimen.                        - Take prescribed proton pump inhibitor or H2 blocker                         (antacid) medications 30 - 60 minutes before meals.                        - Chopped diet.                        - Continue present medications.                        - The findings and recommendations were discussed with                         the patient.                        - The findings and recommendations were discussed with                         the patient's family. Procedure Code(s):     --- Professional ---                        [phone], Esophagogastroduodenoscopy, flexible,                         transoral; with transendoscopic balloon dilation of                         esophagus (less than 30 mm diameter)                        43239, 59, Esophagogastroduodenoscopy, flexible,  transoral; with biopsy, single or multiple Diagnosis Code(s):     --- Professional ---                        K21.00, Gastro-esophageal reflux disease with                         esophagitis, without bleeding                        K22.8, Other specified diseases of esophagus                        K22.2, Esophageal obstruction                        K44.9, Diaphragmatic hernia without obstruction or                         gangrene                        K31.89, Other diseases of stomach and duodenum                        R13.10, Dysphagia, unspecified CPT copyright 2019 American Medical Association.  All rights reserved. The codes documented in this report are preliminary and upon coder review may  be revised to meet current compliance requirements.  Melodie Bouillon, MD Michel Bickers B. Maximino Greenland MD, MD 04/29/2021 2:03:50 PM This report has been signed electronically. Number of Addenda: 0 Note Initiated On: 04/29/2021 1:24 PM Total Procedure Duration: 0 hours 14 minutes 33 seconds  Estimated Blood Loss:  Estimated blood loss: none.      [facility]

## 2021-04-29 NOTE — Transfer of Care (Signed)
Immediate Anesthesia Transfer of Care Note  Patient: Blake Patrick  Procedure(s) Performed: ESOPHAGOGASTRODUODENOSCOPY (EGD) WITH PROPOFOL (Esophagus) BIOPSY (Esophagus) ESOPHAGEAL DILITATION (Esophagus)  Patient Location: PACU  Anesthesia Type: General  Level of Consciousness: awake, alert  and patient cooperative  Airway and Oxygen Therapy: Patient Spontanous Breathing and Patient connected to supplemental oxygen  Post-op Assessment: Post-op Vital signs reviewed, Patient's Cardiovascular Status Stable, Respiratory Function Stable, Patent Airway and No signs of Nausea or vomiting  Post-op Vital Signs: Reviewed and stable  Complications: No notable events documented.

## 2021-04-29 NOTE — H&P (Signed)
Blake Bouillon, MD [address], [place], [place], [address] [address], [place], [place], [address] Phone: [phone]  Fax: [phone]  Primary Care Physician:  Duanne Limerick, MD   Pre-Procedure History & Physical: HPI:  Blake Patrick is a 70 y.o. male is here for an EGD.   Past Medical History:  Diagnosis Date   Allergy 06/07/20   Went for Allergy Test   Arthritis    hands, fingers   Cataract    Replaced in 2019   Colon polyp    Diverticulosis    GERD (gastroesophageal reflux disease)    occasionally   History of placement of ear tubes    2012- came out shortly after   Hyperlipidemia    Migraine    daily migraines   Sinusitis    chronic   Tendonitis    both ankles   Tinnitus     Past Surgical History:  Procedure Laterality Date   ELBOW SURGERY Right 2007   ETHMOIDECTOMY Bilateral 10/27/2019   Procedure: TOTAL ETHMOIDECTOMY WITH FRONTAL SINUOTOMY;  Surgeon: Vernie Murders, MD;  Location: [facility];  Service: ENT;  Laterality: Bilateral;   EYE SURGERY  2019   Replaced   HAND SURGERY  1983   HERNIA REPAIR  1995   IMAGE GUIDED SINUS SURGERY Bilateral 10/27/2019   Procedure: IMAGE GUIDED SINUS SURGERY;  Surgeon: Vernie Murders, MD;  Location: [facility];  Service: ENT;  Laterality: Bilateral;  need stryker disk placed disk on or charge nurse desk 2-17   kp   IMAGE GUIDED SINUS SURGERY Bilateral 04/26/2020   Procedure: IMAGE GUIDED SINUS SURGERY;  Surgeon: Vernie Murders, MD;  Location: [facility];  Service: ENT;  Laterality: Bilateral;  need stryker disk gave Steward Drone disk on 8-23 kp   MENISCUS REPAIR Left 2018   SEPTOPLASTY Bilateral 10/27/2019   Procedure: SEPTOPLASTY REVISION;  Surgeon: Vernie Murders, MD;  Location: [facility];  Service: ENT;  Laterality: Bilateral;   SPHENOIDECTOMY Right 04/26/2020   Procedure: Selina Cooley;  Surgeon: Vernie Murders, MD;  Location: [facility];  Service: ENT;   Laterality: Right;   TONSILLECTOMY  1964    Prior to Admission medications   Medication Sig Start Date End Date Taking? Authorizing Provider  APPLE CIDER VINEGAR PO Take 500 mg by mouth.    [provider]  budesonide (PULMICORT) 0.5 MG/2ML nebulizer solution Inhale into the lungs. 11/14/20 11/14/21  [provider]  Calcium Carbonate-Vitamin D (RA CALCIUM PLUS VITAMIN D PO) Take 525 mg by mouth 2 (two) times daily.    [provider]  Cholecalciferol (VITAMIN D) 50 MCG (2000 UT) CAPS Take 2 capsules by mouth daily.    [provider]  COVID-19 mRNA Vac-TriS, Pfizer, SUSP injection Inject into the muscle. 04/08/21   Judyann Munson, MD  EPINEPHrine 0.3 mg/0.3 mL IJ SOAJ injection  06/13/20   [provider]  fexofenadine (ALLEGRA) 180 MG tablet Take 180 mg by mouth daily.    [provider]  fluticasone (FLONASE) 50 MCG/ACT nasal spray Place 2 sprays into both nostrils daily. 04/16/20   [provider]  gabapentin (NEURONTIN) 300 MG capsule Take 2 capsules by mouth 3 (three) times daily. neurology Patient not taking: Reported on 04/26/2021 12/17/20 12/17/21  [provider]  Glucosamine-Chondroitin (GLUCOSAMINE CHONDR COMPLEX PO) Take 3,700 mg by mouth daily.    [provider]  ibuprofen (ADVIL) 600 MG tablet Take 600 mg by mouth every 6 (six) hours as  needed.    [provider]  ipratropium-albuterol (DUONEB) 0.5-2.5 (3) MG/3ML SOLN Inhale into the lungs. 11/14/20 11/09/21  [provider]  Lasmiditan Succinate 100 MG TABS Take by mouth. neuro 10/15/20   [provider]  naratriptan (AMERGE) 2.5 MG tablet  05/24/20   [provider]  polycarbophil (FIBERCON) 625 MG tablet Take 1,250 mg by mouth 2 (two) times daily.    [provider]  potassium chloride (KLOR-CON) 10 MEQ tablet Take by mouth. 04/22/21 04/22/22  [provider]  Riboflavin 400 MG TABS Take 1 tablet by mouth  daily.    [provider]  rosuvastatin (CRESTOR) 10 MG tablet TAKE 1 TABLET EVERY DAY  (DUE  FOR  BLOOD  WORK) 03/19/21   Duanne Limerick, MD  vitamin C (ASCORBIC ACID) 500 MG tablet Take 500 mg by mouth daily.    [provider]  Vitamin E 180 MG CAPS Take 1 capsule by mouth daily.    [provider]  Vitamins-Lipotropics (LIPO-FLAVONOID PLUS PO) Take 1,000 mg by mouth 2 (two) times daily.    [provider]  Zinc 50 MG CAPS Take 1 capsule by mouth daily.    [provider]    Allergies as of 04/25/2021   (No Known Allergies)    Family History  Problem Relation Age of Onset   Cancer Father     Social History   Socioeconomic History   Marital status: Married    Spouse name: Not on file   Number of children: Not on file   Years of education: Not on file   Highest education level: Not on file  Occupational History   Not on file  Tobacco Use   Smoking status: Former    Packs/day: 1.00    Years: 26.00    Pack years: 26.00    Types: Cigarettes    Quit date: 09/01/1988    Years since quitting: 32.6   Smokeless tobacco: Never  Substance and Sexual Activity   Alcohol use: Yes    Alcohol/week: 7.0 standard drinks    Types: 7 Cans of beer per week    Comment: Social drinking only   Drug use: Never   Sexual activity: Yes  Other Topics Concern   Not on file  Social History Narrative   Not on file   Social Determinants of Health   Financial Resource Strain: Low Risk    Difficulty of Paying Living Expenses: Not hard at all  Food Insecurity: No Food Insecurity   Worried About Programme researcher, broadcasting/film/video in the Last Year: Never true   Ran Out of Food in the Last Year: Never true  Transportation Needs: No Transportation Needs   Lack of Transportation (Medical): No   Lack of Transportation (Non-Medical): No  Physical Activity: Inactive   Days of Exercise per Week: 0 days   Minutes of Exercise per Session: 0 min  Stress: No Stress Concern  Present   Feeling of Stress : Not at all  Social Connections: Moderately Isolated   Frequency of Communication with Friends and Family: More than three times a week   Frequency of Social Gatherings with Friends and Family: More than three times a week   Attends Religious Services: Never   Database administrator or Organizations: No   Attends Banker Meetings: Never   Marital Status: Married  Catering manager Violence: Not At Risk   Fear of Current or Ex-Partner: No   Emotionally Abused: No  Physically Abused: No   Sexually Abused: No    Review of Systems: See HPI, otherwise negative ROS  Constitutional: General:   Alert,  Well-developed, well-nourished, pleasant and cooperative in NAD Ht 5\' 4"  (1.626 m)   Wt 68 kg   BMI 25.75 kg/m   Head: Normocephalic, atraumatic.   Eyes:  Sclera clear, no icterus.   Conjunctiva pink.   Mouth:  No deformity or lesions, oropharynx pink & moist.  Neck:  Supple, trachea midline  Respiratory: Normal respiratory effort  Gastrointestinal:  Soft, non-tender and non-distended without masses, hepatosplenomegaly or hernias noted.  No guarding or rebound tenderness.     Cardiac: No clubbing or edema.  No cyanosis. Normal posterior tibial pedal pulses noted.  Lymphatic:  No significant cervical adenopathy.  Psych:  Alert and cooperative. Normal mood and affect.  Musculoskeletal:   Symmetrical without gross deformities. 5/5 Lower extremity strength bilaterally.  Skin: Warm. Intact without significant lesions or rashes. No jaundice.  Neurologic:  Face symmetrical, tongue midline, Normal sensation to touch;  grossly normal neurologically.  Psych:  Alert and oriented x3, Alert and cooperative. Normal mood and affect.  Impression/Plan: Sriansh Farra is here for an EGD for dysphagia  Risks, benefits, limitations, and alternatives regarding the procedure have been reviewed with the patient.  Questions have been answered.  All parties  agreeable.   [name], MD  04/29/2021, 12:42 PM

## 2021-04-29 NOTE — Anesthesia Procedure Notes (Signed)
Date/Time: 04/29/2021 1:34 PM Performed by: Jimmy Picket, CRNA Pre-anesthesia Checklist: Patient identified, Emergency Drugs available, Suction available, Timeout performed and Patient being monitored Patient Re-evaluated:Patient Re-evaluated prior to induction Oxygen Delivery Method: Nasal cannula Placement Confirmation: positive ETCO2

## 2021-04-29 NOTE — Anesthesia Postprocedure Evaluation (Signed)
Anesthesia Post Note  Patient: Blake Patrick  Procedure(s) Performed: ESOPHAGOGASTRODUODENOSCOPY (EGD) WITH PROPOFOL (Esophagus) BIOPSY (Esophagus) ESOPHAGEAL DILITATION (Esophagus)     Patient location during evaluation: PACU Anesthesia Type: General Level of consciousness: awake and alert Pain management: pain level controlled Vital Signs Assessment: post-procedure vital signs reviewed and stable Respiratory status: spontaneous breathing, nonlabored ventilation, respiratory function stable and patient connected to nasal cannula oxygen Cardiovascular status: blood pressure returned to baseline and stable Postop Assessment: no apparent nausea or vomiting Anesthetic complications: no   No notable events documented.  Taylar Hartsough A  Ercel Normoyle

## 2021-04-29 NOTE — Anesthesia Preprocedure Evaluation (Signed)
Anesthesia Evaluation  Patient identified by MRN, date of birth, ID band Patient awake    Reviewed: Allergy & Precautions, NPO status , Patient's Chart, lab work & pertinent test results, reviewed documented beta blocker date and time   History of Anesthesia Complications Negative for: history of anesthetic complications  Airway Mallampati: III  TM Distance: >3 FB Neck ROM: Full    Dental   Pulmonary asthma , former smoker,    breath sounds clear to auscultation       Cardiovascular (-) angina(-) DOE  Rhythm:Regular Rate:Normal   HLD   Neuro/Psych  Headaches,    GI/Hepatic GERD  , Diverticulosis   Endo/Other    Renal/GU      Musculoskeletal   Abdominal   Peds  Hematology   Anesthesia Other Findings   Reproductive/Obstetrics                            Anesthesia Physical Anesthesia Plan  ASA: 2  Anesthesia Plan: General   Post-op Pain Management:    Induction: Intravenous  PONV Risk Score and Plan: 2 and Propofol infusion, TIVA and Treatment may vary due to age or medical condition  Airway Management Planned: Natural Airway and Nasal Cannula  Additional Equipment:   Intra-op Plan:   Post-operative Plan:   Informed Consent: I have reviewed the patients History and Physical, chart, labs and discussed the procedure including the risks, benefits and alternatives for the proposed anesthesia with the patient or authorized representative who has indicated his/her understanding and acceptance.       Plan Discussed with: CRNA and Anesthesiologist  Anesthesia Plan Comments:         Anesthesia Quick Evaluation

## 2021-04-30 ENCOUNTER — Encounter: Payer: Self-pay | Admitting: Gastroenterology

## 2021-04-30 DIAGNOSIS — J301 Allergic rhinitis due to pollen: Secondary | ICD-10-CM | POA: Diagnosis not present

## 2021-05-01 DIAGNOSIS — M6281 Muscle weakness (generalized): Secondary | ICD-10-CM | POA: Diagnosis not present

## 2021-05-01 DIAGNOSIS — R519 Headache, unspecified: Secondary | ICD-10-CM | POA: Diagnosis not present

## 2021-05-01 DIAGNOSIS — M62838 Other muscle spasm: Secondary | ICD-10-CM | POA: Diagnosis not present

## 2021-05-01 DIAGNOSIS — M542 Cervicalgia: Secondary | ICD-10-CM | POA: Diagnosis not present

## 2021-05-01 LAB — SURGICAL PATHOLOGY

## 2021-05-03 DIAGNOSIS — M25674 Stiffness of right foot, not elsewhere classified: Secondary | ICD-10-CM | POA: Diagnosis not present

## 2021-05-03 DIAGNOSIS — M542 Cervicalgia: Secondary | ICD-10-CM | POA: Diagnosis not present

## 2021-05-03 DIAGNOSIS — R519 Headache, unspecified: Secondary | ICD-10-CM | POA: Diagnosis not present

## 2021-05-03 DIAGNOSIS — M25571 Pain in right ankle and joints of right foot: Secondary | ICD-10-CM | POA: Diagnosis not present

## 2021-05-03 DIAGNOSIS — R262 Difficulty in walking, not elsewhere classified: Secondary | ICD-10-CM | POA: Diagnosis not present

## 2021-05-03 DIAGNOSIS — M6281 Muscle weakness (generalized): Secondary | ICD-10-CM | POA: Diagnosis not present

## 2021-05-03 DIAGNOSIS — M62838 Other muscle spasm: Secondary | ICD-10-CM | POA: Diagnosis not present

## 2021-05-07 DIAGNOSIS — J301 Allergic rhinitis due to pollen: Secondary | ICD-10-CM | POA: Diagnosis not present

## 2021-05-08 DIAGNOSIS — R519 Headache, unspecified: Secondary | ICD-10-CM | POA: Diagnosis not present

## 2021-05-08 DIAGNOSIS — M62838 Other muscle spasm: Secondary | ICD-10-CM | POA: Diagnosis not present

## 2021-05-08 DIAGNOSIS — M542 Cervicalgia: Secondary | ICD-10-CM | POA: Diagnosis not present

## 2021-05-08 DIAGNOSIS — M6281 Muscle weakness (generalized): Secondary | ICD-10-CM | POA: Diagnosis not present

## 2021-05-10 DIAGNOSIS — M6281 Muscle weakness (generalized): Secondary | ICD-10-CM | POA: Diagnosis not present

## 2021-05-10 DIAGNOSIS — M25674 Stiffness of right foot, not elsewhere classified: Secondary | ICD-10-CM | POA: Diagnosis not present

## 2021-05-10 DIAGNOSIS — M25571 Pain in right ankle and joints of right foot: Secondary | ICD-10-CM | POA: Diagnosis not present

## 2021-05-10 DIAGNOSIS — R262 Difficulty in walking, not elsewhere classified: Secondary | ICD-10-CM | POA: Diagnosis not present

## 2021-05-14 DIAGNOSIS — M25674 Stiffness of right foot, not elsewhere classified: Secondary | ICD-10-CM | POA: Diagnosis not present

## 2021-05-14 DIAGNOSIS — M62838 Other muscle spasm: Secondary | ICD-10-CM | POA: Diagnosis not present

## 2021-05-14 DIAGNOSIS — R262 Difficulty in walking, not elsewhere classified: Secondary | ICD-10-CM | POA: Diagnosis not present

## 2021-05-14 DIAGNOSIS — M25571 Pain in right ankle and joints of right foot: Secondary | ICD-10-CM | POA: Diagnosis not present

## 2021-05-14 DIAGNOSIS — R519 Headache, unspecified: Secondary | ICD-10-CM | POA: Diagnosis not present

## 2021-05-14 DIAGNOSIS — J301 Allergic rhinitis due to pollen: Secondary | ICD-10-CM | POA: Diagnosis not present

## 2021-05-14 DIAGNOSIS — M6281 Muscle weakness (generalized): Secondary | ICD-10-CM | POA: Diagnosis not present

## 2021-05-14 DIAGNOSIS — M542 Cervicalgia: Secondary | ICD-10-CM | POA: Diagnosis not present

## 2021-05-15 DIAGNOSIS — S92811D Other fracture of right foot, subsequent encounter for fracture with routine healing: Secondary | ICD-10-CM | POA: Diagnosis not present

## 2021-05-15 DIAGNOSIS — M19071 Primary osteoarthritis, right ankle and foot: Secondary | ICD-10-CM | POA: Diagnosis not present

## 2021-05-15 DIAGNOSIS — S92101D Unspecified fracture of right talus, subsequent encounter for fracture with routine healing: Secondary | ICD-10-CM | POA: Diagnosis not present

## 2021-05-15 DIAGNOSIS — W11XXXD Fall on and from ladder, subsequent encounter: Secondary | ICD-10-CM | POA: Diagnosis not present

## 2021-05-15 DIAGNOSIS — M7731 Calcaneal spur, right foot: Secondary | ICD-10-CM | POA: Diagnosis not present

## 2021-05-15 DIAGNOSIS — S92124D Nondisplaced fracture of body of right talus, subsequent encounter for fracture with routine healing: Secondary | ICD-10-CM | POA: Diagnosis not present

## 2021-05-15 DIAGNOSIS — M8580 Other specified disorders of bone density and structure, unspecified site: Secondary | ICD-10-CM | POA: Diagnosis not present

## 2021-05-15 DIAGNOSIS — S93314D Dislocation of tarsal joint of right foot, subsequent encounter: Secondary | ICD-10-CM | POA: Diagnosis not present

## 2021-05-15 DIAGNOSIS — M85871 Other specified disorders of bone density and structure, right ankle and foot: Secondary | ICD-10-CM | POA: Diagnosis not present

## 2021-05-16 DIAGNOSIS — M25571 Pain in right ankle and joints of right foot: Secondary | ICD-10-CM | POA: Diagnosis not present

## 2021-05-16 DIAGNOSIS — M6281 Muscle weakness (generalized): Secondary | ICD-10-CM | POA: Diagnosis not present

## 2021-05-16 DIAGNOSIS — M25674 Stiffness of right foot, not elsewhere classified: Secondary | ICD-10-CM | POA: Diagnosis not present

## 2021-05-16 DIAGNOSIS — M542 Cervicalgia: Secondary | ICD-10-CM | POA: Diagnosis not present

## 2021-05-16 DIAGNOSIS — R262 Difficulty in walking, not elsewhere classified: Secondary | ICD-10-CM | POA: Diagnosis not present

## 2021-05-16 DIAGNOSIS — M62838 Other muscle spasm: Secondary | ICD-10-CM | POA: Diagnosis not present

## 2021-05-16 DIAGNOSIS — R519 Headache, unspecified: Secondary | ICD-10-CM | POA: Diagnosis not present

## 2021-05-21 DIAGNOSIS — M6281 Muscle weakness (generalized): Secondary | ICD-10-CM | POA: Diagnosis not present

## 2021-05-21 DIAGNOSIS — R519 Headache, unspecified: Secondary | ICD-10-CM | POA: Diagnosis not present

## 2021-05-21 DIAGNOSIS — M542 Cervicalgia: Secondary | ICD-10-CM | POA: Diagnosis not present

## 2021-05-21 DIAGNOSIS — R262 Difficulty in walking, not elsewhere classified: Secondary | ICD-10-CM | POA: Diagnosis not present

## 2021-05-21 DIAGNOSIS — M25571 Pain in right ankle and joints of right foot: Secondary | ICD-10-CM | POA: Diagnosis not present

## 2021-05-21 DIAGNOSIS — M62838 Other muscle spasm: Secondary | ICD-10-CM | POA: Diagnosis not present

## 2021-05-21 DIAGNOSIS — M25674 Stiffness of right foot, not elsewhere classified: Secondary | ICD-10-CM | POA: Diagnosis not present

## 2021-05-21 DIAGNOSIS — J301 Allergic rhinitis due to pollen: Secondary | ICD-10-CM | POA: Diagnosis not present

## 2021-05-22 ENCOUNTER — Encounter: Payer: Self-pay | Admitting: Family Medicine

## 2021-05-22 ENCOUNTER — Other Ambulatory Visit: Payer: Self-pay

## 2021-05-22 ENCOUNTER — Ambulatory Visit: Payer: Medicare PPO | Admitting: Family Medicine

## 2021-05-22 VITALS — BP 130/84 | HR 84 | Ht 64.0 in | Wt 141.0 lb

## 2021-05-22 DIAGNOSIS — Z23 Encounter for immunization: Secondary | ICD-10-CM | POA: Diagnosis not present

## 2021-05-22 DIAGNOSIS — F5101 Primary insomnia: Secondary | ICD-10-CM | POA: Diagnosis not present

## 2021-05-22 DIAGNOSIS — E785 Hyperlipidemia, unspecified: Secondary | ICD-10-CM | POA: Diagnosis not present

## 2021-05-22 MED ORDER — ROSUVASTATIN CALCIUM 10 MG PO TABS: ORAL_TABLET | ORAL | 1 refills | Status: DC

## 2021-05-22 MED ORDER — TRAZODONE HCL 50 MG PO TABS: 50.0000 mg | ORAL_TABLET | Freq: Every evening | ORAL | 1 refills | Status: DC | PRN

## 2021-05-22 NOTE — Progress Notes (Signed)
Date:  05/22/2021   Name:  Blake Patrick   DOB:  June 09, 1951   MRN:  [id number]   Chief Complaint: Insomnia  Insomnia Primary symptoms: fragmented sleep, difficulty falling asleep, frequent awakening, malaise/fatigue.   The current episode started more than one month (6 months). The onset quality is gradual. The problem occurs nightly. The problem is unchanged. The symptoms are aggravated by pain (migraine history). How many beverages per day that contain caffeine: 0 - 1.  Types of beverages you drink: coffee. Nothing relieves the symptoms. Past treatments include medication (melatonin and otc sleeping pills). The treatment provided no relief. Typical bedtime:  8-10 P.M..  How long after going to bed to you fall asleep: over an hour.     Lab Results  Component Value Date   CREATININE 1.00 04/02/2021   BUN 14 04/02/2021   NA 135 04/02/2021   K 4.1 04/02/2021   CL 107 04/02/2021   CO2 22 04/02/2021   Lab Results  Component Value Date   CHOL 184 01/14/2021   HDL 69 01/14/2021   LDLCALC 100 (H) 01/14/2021   TRIG 85 01/14/2021   Lab Results  Component Value Date   TSH 0.69 07/08/2018   Lab Results  Component Value Date   HGBA1C 5.4 04/06/2019   Lab Results  Component Value Date   WBC 5.6 04/02/2021   HGB 14.4 04/02/2021   HCT 40.8 04/02/2021   MCV 93.6 04/02/2021   PLT 238 04/02/2021   Lab Results  Component Value Date   ALT 16 04/02/2021   AST 13 (L) 04/02/2021   ALKPHOS 76 04/02/2021   BILITOT 0.9 04/02/2021     Review of Systems  Constitutional:  Positive for malaise/fatigue. Negative for chills and fever.  HENT:  Negative for drooling, ear discharge, ear pain and sore throat.   Respiratory:  Negative for cough, shortness of breath and wheezing.   Cardiovascular:  Negative for chest pain, palpitations and leg swelling.  Gastrointestinal:  Negative for abdominal pain, blood in stool, constipation, diarrhea and nausea.  Endocrine: Negative for polydipsia.   Genitourinary:  Negative for dysuria, frequency, hematuria and urgency.       Nocturia x 1  Musculoskeletal:  Negative for back pain, myalgias and neck pain.  Skin:  Negative for rash.  Allergic/Immunologic: Negative for environmental allergies.  Neurological:  Positive for headaches. Negative for dizziness.  Hematological:  Does not bruise/bleed easily.  Psychiatric/Behavioral:  Negative for suicidal ideas. The patient has insomnia. The patient is not nervous/anxious.    Patient Active Problem List   Diagnosis Date Noted   Esophageal dysphagia    Duodenal erythema    Esophagitis    Esophageal stenosis    Migraines 12/21/2017    No Known Allergies  Past Surgical History:  Procedure Laterality Date   BIOPSY N/A 04/29/2021   Procedure: BIOPSY;  Surgeon: Pasty Spillers, MD;  Location: [facility];  Service: Endoscopy;  Laterality: N/A;   ELBOW SURGERY Right 2007   ESOPHAGOGASTRODUODENOSCOPY (EGD) WITH PROPOFOL N/A 04/29/2021   Procedure: ESOPHAGOGASTRODUODENOSCOPY (EGD) WITH PROPOFOL;  Surgeon: Pasty Spillers, MD;  Location: [facility];  Service: Endoscopy;  Laterality: N/A;   ETHMOIDECTOMY Bilateral 10/27/2019   Procedure: TOTAL ETHMOIDECTOMY WITH FRONTAL SINUOTOMY;  Surgeon: Vernie Murders, MD;  Location: [facility];  Service: ENT;  Laterality: Bilateral;   EYE SURGERY  2019   Replaced   HAND SURGERY  1983   HERNIA REPAIR  1995   IMAGE GUIDED SINUS  SURGERY Bilateral 10/27/2019   Procedure: IMAGE GUIDED SINUS SURGERY;  Surgeon: Vernie Murders, MD;  Location: [facility];  Service: ENT;  Laterality: Bilateral;  need stryker disk placed disk on or charge nurse desk 2-17   kp   IMAGE GUIDED SINUS SURGERY Bilateral 04/26/2020   Procedure: IMAGE GUIDED SINUS SURGERY;  Surgeon: Vernie Murders, MD;  Location: [facility];  Service: ENT;  Laterality: Bilateral;  need stryker disk gave Steward Drone disk on 8-23 kp   MENISCUS REPAIR Left 2018    SEPTOPLASTY Bilateral 10/27/2019   Procedure: SEPTOPLASTY REVISION;  Surgeon: Vernie Murders, MD;  Location: [facility];  Service: ENT;  Laterality: Bilateral;   SPHENOIDECTOMY Right 04/26/2020   Procedure: Selina Cooley;  Surgeon: Vernie Murders, MD;  Location: [facility];  Service: ENT;  Laterality: Right;   TONSILLECTOMY  1964    Social History   Tobacco Use   Smoking status: Former    Packs/day: 1.00    Years: 26.00    Pack years: 26.00    Types: Cigarettes    Quit date: 09/01/1988    Years since quitting: 32.7   Smokeless tobacco: Never  Substance Use Topics   Alcohol use: Yes    Alcohol/week: 7.0 standard drinks    Types: 7 Cans of beer per week    Comment: Social drinking only   Drug use: Never     Medication list has been reviewed and updated.  Current Meds  Medication Sig   APPLE CIDER VINEGAR PO Take 500 mg by mouth.   budesonide (PULMICORT) 0.5 MG/2ML nebulizer solution Inhale into the lungs.   Calcium Carbonate-Vitamin D (RA CALCIUM PLUS VITAMIN D PO) Take 525 mg by mouth 2 (two) times daily.   Cholecalciferol (VITAMIN D) 50 MCG (2000 UT) CAPS Take 2 capsules by mouth daily.   EPINEPHrine 0.3 mg/0.3 mL IJ SOAJ injection    fexofenadine (ALLEGRA) 180 MG tablet Take 180 mg by mouth daily.   fluticasone (FLONASE) 50 MCG/ACT nasal spray Place 2 sprays into both nostrils daily.   Glucosamine-Chondroitin (GLUCOSAMINE CHONDR COMPLEX PO) Take 3,700 mg by mouth daily.   ibuprofen (ADVIL) 600 MG tablet Take 600 mg by mouth every 6 (six) hours as needed.   ipratropium-albuterol (DUONEB) 0.5-2.5 (3) MG/3ML SOLN Inhale into the lungs.   Lasmiditan Succinate 100 MG TABS Take by mouth. neuro   naratriptan (AMERGE) 2.5 MG tablet    omeprazole (PRILOSEC) 20 MG capsule Take 1 capsule (20 mg total) by mouth in the morning and at bedtime for 60 days, THEN 1 capsule (20 mg total) daily.   polycarbophil (FIBERCON) 625 MG tablet Take 1,250 mg by mouth 2 (two) times  daily.   potassium chloride (KLOR-CON) 10 MEQ tablet Take by mouth.   Riboflavin 400 MG TABS Take 1 tablet by mouth daily.   rosuvastatin (CRESTOR) 10 MG tablet TAKE 1 TABLET EVERY DAY  (DUE  FOR  BLOOD  WORK)   vitamin C (ASCORBIC ACID) 500 MG tablet Take 500 mg by mouth daily.   Vitamin E 180 MG CAPS Take 1 capsule by mouth daily.   Vitamins-Lipotropics (LIPO-FLAVONOID PLUS PO) Take 1,000 mg by mouth 2 (two) times daily.   Zinc 50 MG CAPS Take 1 capsule by mouth daily.    PHQ 2/9 Scores 01/14/2021 06/20/2020 04/17/2020 11/03/2019  PHQ - 2 Score 0 0 0 0  PHQ- 9 Score 0 - 0 0    GAD 7 : Generalized Anxiety Score 01/14/2021 04/17/2020 11/03/2019 07/07/2019  Nervous,  Anxious, on Edge 0 0 0 0  Control/stop worrying 0 0 0 0  Worry too much - different things 0 0 0 0  Trouble relaxing 0 0 0 0  Restless 0 0 0 0  Easily annoyed or irritable 0 0 0 0  Afraid - awful might happen 0 0 0 0  Total GAD 7 Score 0 0 0 0  Anxiety Difficulty - Not difficult at all Not difficult at all -    BP Readings from Last 3 Encounters:  04/29/21 110/81  04/02/21 (!) 148/79  02/08/21 121/86    Physical Exam Vitals and nursing note reviewed.  HENT:     Right Ear: Tympanic membrane and ear canal normal. There is no impacted cerumen.     Left Ear: Tympanic membrane and ear canal normal. There is no impacted cerumen.     Nose: Nose normal.  Neck:     Vascular: No carotid bruit.  Cardiovascular:     Chest Wall: PMI is not displaced.     Pulses: Normal pulses.     Heart sounds: Normal heart sounds, S1 normal and S2 normal. No murmur heard. No systolic murmur is present.  No diastolic murmur is present.    No friction rub. No S3 or S4 sounds.  Pulmonary:     Effort: No respiratory distress.     Breath sounds: No wheezing or rhonchi.    Wt Readings from Last 3 Encounters:  05/22/21 141 lb (64 kg)  04/29/21 139 lb 14.4 oz (63.5 kg)  04/02/21 150 lb (68 kg)    Ht 5\' 4"  (1.626 m)   Wt 141 lb (64 kg)   BMI  24.20 kg/m   Assessment and Plan:  1. Primary insomnia New problem to our attention.  Persistent over 6 months.  Patient has difficulty initiating sleep as well as fragmented sleep.  We will initiate trazodone at 50 mg nightly and patient may increase to 100 mg if necessary.  Patient will return in 6 weeks for reevaluation.  2. Hyperlipidemia, unspecified hyperlipidemia type Chronic.  Controlled.  Stable.  Continue rosuvastatin 10 mg once a day.  We will recheck lipid panel. - rosuvastatin (CRESTOR) 10 MG tablet; TAKE 1 TABLET EVERY DAY  (DUE  FOR  BLOOD  WORK)  Dispense: 90 tablet; Refill: 1 - Lipid Panel With LDL/HDL Ratio  3. Need for immunization against influenza Discussed and administered. - Flu Vaccine QUAD High Dose(Fluad)

## 2021-05-23 DIAGNOSIS — R519 Headache, unspecified: Secondary | ICD-10-CM | POA: Diagnosis not present

## 2021-05-23 DIAGNOSIS — M25571 Pain in right ankle and joints of right foot: Secondary | ICD-10-CM | POA: Diagnosis not present

## 2021-05-23 DIAGNOSIS — M62838 Other muscle spasm: Secondary | ICD-10-CM | POA: Diagnosis not present

## 2021-05-23 DIAGNOSIS — M6281 Muscle weakness (generalized): Secondary | ICD-10-CM | POA: Diagnosis not present

## 2021-05-23 DIAGNOSIS — M542 Cervicalgia: Secondary | ICD-10-CM | POA: Diagnosis not present

## 2021-05-23 DIAGNOSIS — M25674 Stiffness of right foot, not elsewhere classified: Secondary | ICD-10-CM | POA: Diagnosis not present

## 2021-05-23 DIAGNOSIS — R262 Difficulty in walking, not elsewhere classified: Secondary | ICD-10-CM | POA: Diagnosis not present

## 2021-05-23 LAB — LIPID PANEL WITH LDL/HDL RATIO
Cholesterol, Total: 178 mg/dL (ref 100–199)
HDL: 64 mg/dL (ref >39)
LDL Chol Calc (NIH): 92 mg/dL (ref 0–99)
LDL/HDL Ratio: 1.4 ratio (ref 0.0–3.6)
Triglycerides: 124 mg/dL (ref 0–149)
VLDL Cholesterol Cal: 22 mg/dL (ref 5–40)

## 2021-05-28 DIAGNOSIS — J301 Allergic rhinitis due to pollen: Secondary | ICD-10-CM | POA: Diagnosis not present

## 2021-05-28 DIAGNOSIS — M25571 Pain in right ankle and joints of right foot: Secondary | ICD-10-CM | POA: Diagnosis not present

## 2021-05-28 DIAGNOSIS — M6281 Muscle weakness (generalized): Secondary | ICD-10-CM | POA: Diagnosis not present

## 2021-05-28 DIAGNOSIS — M25674 Stiffness of right foot, not elsewhere classified: Secondary | ICD-10-CM | POA: Diagnosis not present

## 2021-05-28 DIAGNOSIS — R262 Difficulty in walking, not elsewhere classified: Secondary | ICD-10-CM | POA: Diagnosis not present

## 2021-05-29 DIAGNOSIS — J4550 Severe persistent asthma, uncomplicated: Secondary | ICD-10-CM | POA: Diagnosis not present

## 2021-05-29 DIAGNOSIS — J301 Allergic rhinitis due to pollen: Secondary | ICD-10-CM | POA: Diagnosis not present

## 2021-05-31 DIAGNOSIS — R262 Difficulty in walking, not elsewhere classified: Secondary | ICD-10-CM | POA: Diagnosis not present

## 2021-05-31 DIAGNOSIS — M25674 Stiffness of right foot, not elsewhere classified: Secondary | ICD-10-CM | POA: Diagnosis not present

## 2021-05-31 DIAGNOSIS — M6281 Muscle weakness (generalized): Secondary | ICD-10-CM | POA: Diagnosis not present

## 2021-05-31 DIAGNOSIS — M25571 Pain in right ankle and joints of right foot: Secondary | ICD-10-CM | POA: Diagnosis not present

## 2021-06-03 DIAGNOSIS — R519 Headache, unspecified: Secondary | ICD-10-CM | POA: Diagnosis not present

## 2021-06-03 DIAGNOSIS — M62838 Other muscle spasm: Secondary | ICD-10-CM | POA: Diagnosis not present

## 2021-06-03 DIAGNOSIS — M542 Cervicalgia: Secondary | ICD-10-CM | POA: Diagnosis not present

## 2021-06-03 DIAGNOSIS — M6281 Muscle weakness (generalized): Secondary | ICD-10-CM | POA: Diagnosis not present

## 2021-06-04 DIAGNOSIS — J301 Allergic rhinitis due to pollen: Secondary | ICD-10-CM | POA: Diagnosis not present

## 2021-06-06 DIAGNOSIS — M25571 Pain in right ankle and joints of right foot: Secondary | ICD-10-CM | POA: Diagnosis not present

## 2021-06-06 DIAGNOSIS — R262 Difficulty in walking, not elsewhere classified: Secondary | ICD-10-CM | POA: Diagnosis not present

## 2021-06-06 DIAGNOSIS — M25674 Stiffness of right foot, not elsewhere classified: Secondary | ICD-10-CM | POA: Diagnosis not present

## 2021-06-06 DIAGNOSIS — M6281 Muscle weakness (generalized): Secondary | ICD-10-CM | POA: Diagnosis not present

## 2021-06-11 DIAGNOSIS — J301 Allergic rhinitis due to pollen: Secondary | ICD-10-CM | POA: Diagnosis not present

## 2021-06-12 DIAGNOSIS — M6281 Muscle weakness (generalized): Secondary | ICD-10-CM | POA: Diagnosis not present

## 2021-06-12 DIAGNOSIS — M62838 Other muscle spasm: Secondary | ICD-10-CM | POA: Diagnosis not present

## 2021-06-12 DIAGNOSIS — R262 Difficulty in walking, not elsewhere classified: Secondary | ICD-10-CM | POA: Diagnosis not present

## 2021-06-12 DIAGNOSIS — M542 Cervicalgia: Secondary | ICD-10-CM | POA: Diagnosis not present

## 2021-06-12 DIAGNOSIS — M25674 Stiffness of right foot, not elsewhere classified: Secondary | ICD-10-CM | POA: Diagnosis not present

## 2021-06-12 DIAGNOSIS — M25571 Pain in right ankle and joints of right foot: Secondary | ICD-10-CM | POA: Diagnosis not present

## 2021-06-12 DIAGNOSIS — R519 Headache, unspecified: Secondary | ICD-10-CM | POA: Diagnosis not present

## 2021-06-13 ENCOUNTER — Other Ambulatory Visit: Payer: Self-pay | Admitting: Family Medicine

## 2021-06-14 DIAGNOSIS — M6281 Muscle weakness (generalized): Secondary | ICD-10-CM | POA: Diagnosis not present

## 2021-06-14 DIAGNOSIS — R262 Difficulty in walking, not elsewhere classified: Secondary | ICD-10-CM | POA: Diagnosis not present

## 2021-06-14 DIAGNOSIS — M542 Cervicalgia: Secondary | ICD-10-CM | POA: Diagnosis not present

## 2021-06-14 DIAGNOSIS — M25674 Stiffness of right foot, not elsewhere classified: Secondary | ICD-10-CM | POA: Diagnosis not present

## 2021-06-14 DIAGNOSIS — M62838 Other muscle spasm: Secondary | ICD-10-CM | POA: Diagnosis not present

## 2021-06-14 DIAGNOSIS — R519 Headache, unspecified: Secondary | ICD-10-CM | POA: Diagnosis not present

## 2021-06-14 DIAGNOSIS — M25571 Pain in right ankle and joints of right foot: Secondary | ICD-10-CM | POA: Diagnosis not present

## 2021-06-14 NOTE — Telephone Encounter (Signed)
Requested medications are due for refill today requesting early, and has a refill left  Requested medications are on the active medication list yes  Last refill 05/22/21  Last visit 05/22/21  Future visit scheduled 07/08/21  Notes to clinic requesting early, has refill left.  Requested Prescriptions  Pending Prescriptions Disp Refills   traZODone (DESYREL) 50 MG tablet [Pharmacy Med Name: TRAZODONE 50 MG TABLET] 180 tablet 1    Sig: TAKE 1-2 TABLETS BY MOUTH AT BEDTIME AS NEEDED FOR SLEEP.     Psychiatry: Antidepressants - Serotonin Modulator Passed - 06/13/2021  1:30 PM      Passed - Valid encounter within last 6 months    Recent Outpatient Visits           3 weeks ago Primary insomnia   [facility] Duanne Limerick, MD   5 months ago Hyperlipidemia, unspecified hyperlipidemia type   [facility] Duanne Limerick, MD   1 year ago Tenosynovitis of left foot   [facility] Duanne Limerick, MD   1 year ago Encounter for annual physical exam   [facility] Duanne Limerick, MD   1 year ago Establishing care with new doctor, encounter for   [facility] Duanne Limerick, MD       Future Appointments             In 3 weeks Pasty Spillers, MD [facility] [place]   In 3 weeks Duanne Limerick, MD [facility]

## 2021-06-18 DIAGNOSIS — G43711 Chronic migraine without aura, intractable, with status migrainosus: Secondary | ICD-10-CM | POA: Diagnosis not present

## 2021-06-18 DIAGNOSIS — G4452 New daily persistent headache (NDPH): Secondary | ICD-10-CM | POA: Diagnosis not present

## 2021-06-18 DIAGNOSIS — J301 Allergic rhinitis due to pollen: Secondary | ICD-10-CM | POA: Diagnosis not present

## 2021-06-19 DIAGNOSIS — R262 Difficulty in walking, not elsewhere classified: Secondary | ICD-10-CM | POA: Diagnosis not present

## 2021-06-19 DIAGNOSIS — M6281 Muscle weakness (generalized): Secondary | ICD-10-CM | POA: Diagnosis not present

## 2021-06-19 DIAGNOSIS — M25571 Pain in right ankle and joints of right foot: Secondary | ICD-10-CM | POA: Diagnosis not present

## 2021-06-19 DIAGNOSIS — M25674 Stiffness of right foot, not elsewhere classified: Secondary | ICD-10-CM | POA: Diagnosis not present

## 2021-06-21 DIAGNOSIS — M25674 Stiffness of right foot, not elsewhere classified: Secondary | ICD-10-CM | POA: Diagnosis not present

## 2021-06-21 DIAGNOSIS — M6281 Muscle weakness (generalized): Secondary | ICD-10-CM | POA: Diagnosis not present

## 2021-06-21 DIAGNOSIS — R262 Difficulty in walking, not elsewhere classified: Secondary | ICD-10-CM | POA: Diagnosis not present

## 2021-06-21 DIAGNOSIS — M25571 Pain in right ankle and joints of right foot: Secondary | ICD-10-CM | POA: Diagnosis not present

## 2021-06-24 ENCOUNTER — Ambulatory Visit (INDEPENDENT_AMBULATORY_CARE_PROVIDER_SITE_OTHER): Payer: Medicare PPO

## 2021-06-24 ENCOUNTER — Other Ambulatory Visit: Payer: Self-pay

## 2021-06-24 VITALS — BP 118/72 | HR 63 | Temp 97.7°F | Resp 16 | Ht 64.0 in | Wt 149.0 lb

## 2021-06-24 DIAGNOSIS — Z0000 Encounter for general adult medical examination without abnormal findings: Secondary | ICD-10-CM | POA: Diagnosis not present

## 2021-06-24 NOTE — Patient Instructions (Signed)
Blake Patrick , Thank you for taking time to come for your Medicare Wellness Visit. I appreciate your ongoing commitment to your health goals. Please review the following plan we discussed and let me know if I can assist you in the future.   Screening recommendations/referrals: Colonoscopy: done 03/09/19; repeat 03/2024 Recommended yearly ophthalmology/optometry visit for glaucoma screening and checkup Recommended yearly dental visit for hygiene and checkup  Vaccinations: Influenza vaccine: done 05/22/21 Pneumococcal vaccine: done 11/22/19 Tdap vaccine: due Shingles vaccine: Shingrix discussed. Please contact your pharmacy for coverage information.  Covid-19: done 10/07/19, 11/01/19, 06/25/20 & 04/08/21  Advanced directives: Advance directive discussed with you today. I have provided a copy for you to complete at home and have notarized. Once this is complete please bring a copy in to our office so we can scan it into your chart.   Conditions/risks identified: Recommend increasing physical activity as tolerated  Next appointment: Follow up in one year for your annual wellness visit.   Preventive Care 29 Years and Older, Male Preventive care refers to lifestyle choices and visits with your health care provider that can promote health and wellness. What does preventive care include? A yearly physical exam. This is also called an annual well check. Dental exams once or twice a year. Routine eye exams. Ask your health care provider how often you should have your eyes checked. Personal lifestyle choices, including: Daily care of your teeth and gums. Regular physical activity. Eating a healthy diet. Avoiding tobacco and drug use. Limiting alcohol use. Practicing safe sex. Taking low doses of aspirin every day. Taking vitamin and mineral supplements as recommended by your health care provider. What happens during an annual well check? The services and screenings done by your health care provider  during your annual well check will depend on your age, overall health, lifestyle risk factors, and family history of disease. Counseling  Your health care provider may ask you questions about your: Alcohol use. Tobacco use. Drug use. Emotional well-being. Home and relationship well-being. Sexual activity. Eating habits. History of falls. Memory and ability to understand (cognition). Work and work Astronomer. Screening  You may have the following tests or measurements: Height, weight, and BMI. Blood pressure. Lipid and cholesterol levels. These may be checked every 5 years, or more frequently if you are over 69 years old. Skin check. Lung cancer screening. You may have this screening every year starting at age 34 if you have a 30-pack-year history of smoking and currently smoke or have quit within the past 15 years. Fecal occult blood test (FOBT) of the stool. You may have this test every year starting at age 68. Flexible sigmoidoscopy or colonoscopy. You may have a sigmoidoscopy every 5 years or a colonoscopy every 10 years starting at age 85. Prostate cancer screening. Recommendations will vary depending on your family history and other risks. Hepatitis C blood test. Hepatitis B blood test. Sexually transmitted disease (STD) testing. Diabetes screening. This is done by checking your blood sugar (glucose) after you have not eaten for a while (fasting). You may have this done every 1-3 years. Abdominal aortic aneurysm (AAA) screening. You may need this if you are a current or former smoker. Osteoporosis. You may be screened starting at age 70 if you are at high risk. Talk with your health care provider about your test results, treatment options, and if necessary, the need for more tests. Vaccines  Your health care provider may recommend certain vaccines, such as: Influenza vaccine. This is recommended every  year. Tetanus, diphtheria, and acellular pertussis (Tdap, Td) vaccine. You  may need a Td booster every 10 years. Zoster vaccine. You may need this after age 32. Pneumococcal 13-valent conjugate (PCV13) vaccine. One dose is recommended after age 69. Pneumococcal polysaccharide (PPSV23) vaccine. One dose is recommended after age 43. Talk to your health care provider about which screenings and vaccines you need and how often you need them. This information is not intended to replace advice given to you by your health care provider. Make sure you discuss any questions you have with your health care provider. Document Released: 09/14/2015 Document Revised: 05/07/2016 Document Reviewed: 06/19/2015 Elsevier Interactive Patient Education  2017 [employer].  Fall Prevention in the Home Falls can cause injuries. They can happen to people of all ages. There are many things you can do to make your home safe and to help prevent falls. What can I do on the outside of my home? Regularly fix the edges of walkways and driveways and fix any cracks. Remove anything that might make you trip as you walk through a door, such as a raised step or threshold. Trim any bushes or trees on the path to your home. Use bright outdoor lighting. Clear any walking paths of anything that might make someone trip, such as rocks or tools. Regularly check to see if handrails are loose or broken. Make sure that both sides of any steps have handrails. Any raised decks and porches should have guardrails on the edges. Have any leaves, snow, or ice cleared regularly. Use sand or salt on walking paths during winter. Clean up any spills in your garage right away. This includes oil or grease spills. What can I do in the bathroom? Use night lights. Install grab bars by the toilet and in the tub and shower. Do not use towel bars as grab bars. Use non-skid mats or decals in the tub or shower. If you need to sit down in the shower, use a plastic, non-slip stool. Keep the floor dry. Clean up any water that spills  on the floor as soon as it happens. Remove soap buildup in the tub or shower regularly. Attach bath mats securely with double-sided non-slip rug tape. Do not have throw rugs and other things on the floor that can make you trip. What can I do in the bedroom? Use night lights. Make sure that you have a light by your bed that is easy to reach. Do not use any sheets or blankets that are too big for your bed. They should not hang down onto the floor. Have a firm chair that has side arms. You can use this for support while you get dressed. Do not have throw rugs and other things on the floor that can make you trip. What can I do in the kitchen? Clean up any spills right away. Avoid walking on wet floors. Keep items that you use a lot in easy-to-reach places. If you need to reach something above you, use a strong step stool that has a grab bar. Keep electrical cords out of the way. Do not use floor polish or wax that makes floors slippery. If you must use wax, use non-skid floor wax. Do not have throw rugs and other things on the floor that can make you trip. What can I do with my stairs? Do not leave any items on the stairs. Make sure that there are handrails on both sides of the stairs and use them. Fix handrails that are broken or  loose. Make sure that handrails are as long as the stairways. Check any carpeting to make sure that it is firmly attached to the stairs. Fix any carpet that is loose or worn. Avoid having throw rugs at the top or bottom of the stairs. If you do have throw rugs, attach them to the floor with carpet tape. Make sure that you have a light switch at the top of the stairs and the bottom of the stairs. If you do not have them, ask someone to add them for you. What else can I do to help prevent falls? Wear shoes that: Do not have high heels. Have rubber bottoms. Are comfortable and fit you well. Are closed at the toe. Do not wear sandals. If you use a stepladder: Make  sure that it is fully opened. Do not climb a closed stepladder. Make sure that both sides of the stepladder are locked into place. Ask someone to hold it for you, if possible. Clearly mark and make sure that you can see: Any grab bars or handrails. First and last steps. Where the edge of each step is. Use tools that help you move around (mobility aids) if they are needed. These include: Canes. Walkers. Scooters. Crutches. Turn on the lights when you go into a dark area. Replace any light bulbs as soon as they burn out. Set up your furniture so you have a clear path. Avoid moving your furniture around. If any of your floors are uneven, fix them. If there are any pets around you, be aware of where they are. Review your medicines with your doctor. Some medicines can make you feel dizzy. This can increase your chance of falling. Ask your doctor what other things that you can do to help prevent falls. This information is not intended to replace advice given to you by your health care provider. Make sure you discuss any questions you have with your health care provider. Document Released: 06/14/2009 Document Revised: 01/24/2016 Document Reviewed: 09/22/2014 Elsevier Interactive Patient Education  2017 [employer].

## 2021-06-24 NOTE — Progress Notes (Signed)
Subjective:   Babe Clenney is a 70 y.o. male who presents for Medicare Annual/Subsequent preventive examination.  Review of Systems     Cardiac Risk Factors include: advanced age (>59men, >44 women);male gender;dyslipidemia     Objective:    Today's Vitals   06/24/21 0848 06/24/21 0849  BP: 118/72   Pulse: 63   Resp: 16   Temp: 97.7 F (36.5 C)   TempSrc: Oral   SpO2: 93%   Weight: 149 lb (67.6 kg)   Height: 5\' 4"  (1.626 m)   PainSc:  2    Body mass index is 25.58 kg/m.  Advanced Directives 06/24/2021 04/29/2021 02/08/2021 06/20/2020 04/26/2020 10/27/2019  Does Patient Have a Medical Advance Directive? No Yes Yes Yes Yes Yes  Type of Advance Directive - Healthcare Power of [place];Living will Living will Healthcare Power of [place];Living will Healthcare Power of [place];Living will Healthcare Power of [place];Living will  Does patient want to make changes to medical advance directive? - No - Patient declined - - - No - Patient declined  Copy of Healthcare Power of Attorney in Chart? - No - copy requested - No - copy requested No - copy requested No - copy requested  Would patient like information on creating a medical advance directive? Yes (MAU/Ambulatory/Procedural Areas - Information given) - - - - -    Current Medications (verified) Outpatient Encounter Medications as of 06/24/2021  Medication Sig   acetaminophen (TYLENOL) 650 MG CR tablet Take 2 tablets by mouth as needed.   acetaZOLAMIDE (DIAMOX) 250 MG tablet Take 250 mg by mouth. Take 1 tablet (250 mg total) by mouth as directed (prn headache q4-6 hours as needed) for up to 10 days   APPLE CIDER VINEGAR PO Take 500 mg by mouth.   budesonide (PULMICORT) 0.5 MG/2ML nebulizer solution Inhale into the lungs.   Calcium Carbonate-Vitamin D (RA CALCIUM PLUS VITAMIN D PO) Take 525 mg by mouth 2 (two) times daily.   Cholecalciferol (VITAMIN D) 50 MCG (2000 UT) CAPS Take 1 capsule by mouth daily.   DULoxetine (CYMBALTA)  60 MG capsule Take 1 capsule by mouth daily.   EPINEPHrine 0.3 mg/0.3 mL IJ SOAJ injection    fexofenadine (ALLEGRA) 180 MG tablet Take 180 mg by mouth daily.   fluticasone (FLONASE) 50 MCG/ACT nasal spray Place 2 sprays into both nostrils daily.   Glucosamine-Chondroitin (GLUCOSAMINE CHONDR COMPLEX PO) Take 3,700 mg by mouth daily.   ibuprofen (ADVIL) 600 MG tablet Take 600 mg by mouth every 6 (six) hours as needed.   ipratropium-albuterol (DUONEB) 0.5-2.5 (3) MG/3ML SOLN Inhale into the lungs.   Lasmiditan Succinate 100 MG TABS Take by mouth. neuro   naratriptan (AMERGE) 2.5 MG tablet    omeprazole (PRILOSEC) 20 MG capsule Take 1 capsule (20 mg total) by mouth in the morning and at bedtime for 60 days, THEN 1 capsule (20 mg total) daily.   polycarbophil (FIBERCON) 625 MG tablet Take 1,250 mg by mouth 2 (two) times daily.   Riboflavin 400 MG TABS Take 1 tablet by mouth daily.   rosuvastatin (CRESTOR) 10 MG tablet TAKE 1 TABLET EVERY DAY  (DUE  FOR  BLOOD  WORK)   traZODone (DESYREL) 50 MG tablet TAKE 1-2 TABLETS BY MOUTH AT BEDTIME AS NEEDED FOR SLEEP.   vitamin C (ASCORBIC ACID) 500 MG tablet Take 500 mg by mouth daily.   Vitamin E 180 MG CAPS Take 1 capsule by mouth daily.   Vitamins-Lipotropics (LIPO-FLAVONOID PLUS PO) Take 1,000 mg by  mouth 2 (two) times daily.   Zinc 50 MG CAPS Take 1 capsule by mouth daily.   [DISCONTINUED] potassium chloride (KLOR-CON) 10 MEQ tablet Take by mouth.   No facility-administered encounter medications on file as of 06/24/2021.    Allergies (verified) Patient has no known allergies.   History: Past Medical History:  Diagnosis Date   Allergy 06/07/20   Went for Allergy Test   Arthritis    hands, fingers   Asthma 02/22/2021   Cataract    Replaced in 2019   Colon polyp    Diverticulosis    GERD (gastroesophageal reflux disease)    occasionally   History of placement of ear tubes    2012- came out shortly after   Hyperlipidemia    Migraine     daily migraines   Sinusitis    chronic   Tendonitis    both ankles   Tinnitus    Past Surgical History:  Procedure Laterality Date   BIOPSY N/A 04/29/2021   Procedure: BIOPSY;  Surgeon: Pasty Spillers, MD;  Location: [facility];  Service: Endoscopy;  Laterality: N/A;   ELBOW SURGERY Right 2007   ESOPHAGOGASTRODUODENOSCOPY (EGD) WITH PROPOFOL N/A 04/29/2021   Procedure: ESOPHAGOGASTRODUODENOSCOPY (EGD) WITH PROPOFOL;  Surgeon: Pasty Spillers, MD;  Location: [facility];  Service: Endoscopy;  Laterality: N/A;   ETHMOIDECTOMY Bilateral 10/27/2019   Procedure: TOTAL ETHMOIDECTOMY WITH FRONTAL SINUOTOMY;  Surgeon: Vernie Murders, MD;  Location: [facility];  Service: ENT;  Laterality: Bilateral;   EYE SURGERY  2019   Replaced   HAND SURGERY  1983   HERNIA REPAIR  1995   IMAGE GUIDED SINUS SURGERY Bilateral 10/27/2019   Procedure: IMAGE GUIDED SINUS SURGERY;  Surgeon: Vernie Murders, MD;  Location: [facility];  Service: ENT;  Laterality: Bilateral;  need stryker disk placed disk on or charge nurse desk 2-17   kp   IMAGE GUIDED SINUS SURGERY Bilateral 04/26/2020   Procedure: IMAGE GUIDED SINUS SURGERY;  Surgeon: Vernie Murders, MD;  Location: [facility];  Service: ENT;  Laterality: Bilateral;  need stryker disk gave Steward Drone disk on 8-23 kp   MENISCUS REPAIR Left 2018   SEPTOPLASTY Bilateral 10/27/2019   Procedure: SEPTOPLASTY REVISION;  Surgeon: Vernie Murders, MD;  Location: [facility];  Service: ENT;  Laterality: Bilateral;   SPHENOIDECTOMY Right 04/26/2020   Procedure: Selina Cooley;  Surgeon: Vernie Murders, MD;  Location: [facility];  Service: ENT;  Laterality: Right;   TONSILLECTOMY  1964   Family History  Problem Relation Age of Onset   Cancer Father    Social History   Socioeconomic History   Marital status: Married    Spouse name: Not on file   Number of children: 1   Years of education: Not on file   Highest  education level: Not on file  Occupational History   Not on file  Tobacco Use   Smoking status: Former    Packs/day: 1.00    Years: 26.00    Pack years: 26.00    Types: Cigarettes    Quit date: 09/01/1988    Years since quitting: 32.8   Smokeless tobacco: Never  Vaping Use   Vaping Use: Never used  Substance and Sexual Activity   Alcohol use: Yes    Alcohol/week: 7.0 standard drinks    Types: 7 Cans of beer per week    Comment: Social drinking only   Drug use: Never   Sexual activity: Yes  Other Topics Concern  Not on file  Social History Narrative   Not on file   Social Determinants of Health   Financial Resource Strain: Low Risk    Difficulty of Paying Living Expenses: Not hard at all  Food Insecurity: No Food Insecurity   Worried About Programme researcher, broadcasting/film/video in the Last Year: Never true   Barista in the Last Year: Never true  Transportation Needs: No Transportation Needs   Lack of Transportation (Medical): No   Lack of Transportation (Non-Medical): No  Physical Activity: Inactive   Days of Exercise per Week: 0 days   Minutes of Exercise per Session: 0 min  Stress: No Stress Concern Present   Feeling of Stress : Not at all  Social Connections: Moderately Isolated   Frequency of Communication with Friends and Family: More than three times a week   Frequency of Social Gatherings with Friends and Family: More than three times a week   Attends Religious Services: Never   Database administrator or Organizations: No   Attends Engineer, structural: Never   Marital Status: Married    Tobacco Counseling Counseling given: Not Answered   Clinical Intake:  Pre-visit preparation completed: Yes  Pain : 0-10 Pain Score: 2  Pain Type: Chronic pain Pain Location: Foot Pain Orientation: Right Pain Descriptors / Indicators: Aching, Sore Pain Onset: More than a month ago Pain Frequency: Constant     BMI - recorded: 25.58 Nutritional Status: BMI 25 -29  Overweight Nutritional Risks: None Diabetes: No  How often do you need to have someone help you when you read instructions, pamphlets, or other written materials from your doctor or pharmacy?: 1 - Never    Interpreter Needed?: No  Information entered by :: Reather Littler LPN   Activities of Daily Living In your present state of health, do you have any difficulty performing the following activities: 06/24/2021 04/29/2021  Hearing? N N  Vision? N N  Difficulty concentrating or making decisions? N N  Walking or climbing stairs? Y N  Dressing or bathing? N N  Doing errands, shopping? N -  Preparing Food and eating ? N -  Using the Toilet? N -  In the past six months, have you accidently leaked urine? N -  Do you have problems with loss of bowel control? N -  Managing your Medications? N -  Managing your Finances? N -  Housekeeping or managing your Housekeeping? N -  Some recent data might be hidden    Patient Care Team: Duanne Limerick, MD as PCP - General (Family Medicine)  Indicate any recent Medical Services you may have received from other than [facility] providers in the past year (date may be approximate).     Assessment:   This is a routine wellness examination for [place].  Hearing/Vision screen Hearing Screening - Comments:: Pt denies hearing difficulty Vision Screening - Comments:: [facility] screenings at [facility]  Dietary issues and exercise activities discussed: Current Exercise Habits: The patient does not participate in regular exercise at present, Exercise limited by: orthopedic condition(s);respiratory conditions(s)   Goals Addressed             This Visit's Progress    Increase physical activity   On track    Recommend increasing physical activity to at least 3 days per week       Depression Screen PHQ 2/9 Scores 06/24/2021 05/22/2021 01/14/2021 06/20/2020 04/17/2020 11/03/2019 07/07/2019  PHQ - 2 Score 0 0 0 0  0 0 0  PHQ- 9 Score 1 5 0 - 0 0  0    Fall Risk Fall Risk  06/24/2021 06/20/2020 04/17/2020 11/03/2019  Falls in the past year? 1 0 0 0  Number falls in past yr: 0 0 0 -  Injury with Fall? 1 0 0 -  Risk for fall due to : Orthopedic patient No Fall Risks No Fall Risks -  Follow up Falls prevention discussed Falls prevention discussed Falls evaluation completed Falls evaluation completed    FALL RISK PREVENTION PERTAINING TO THE HOME:  Any stairs in or around the home? Yes  If so, are there any without handrails? No  Home free of loose throw rugs in walkways, pet beds, electrical cords, etc? Yes  Adequate lighting in your home to reduce risk of falls? Yes   ASSISTIVE DEVICES UTILIZED TO PREVENT FALLS:  Life alert? No  Use of a cane, walker or w/c? No  Grab bars in the bathroom? No  Shower chair or bench in shower? Yes  Elevated toilet seat or a handicapped toilet? No   TIMED UP AND GO:  Was the test performed? Yes .  Length of time to ambulate 10 feet: 8 sec.   Gait slow and steady without use of assistive device  Cognitive Function:     6CIT Screen 06/20/2020  What Year? 0 points  What month? 0 points  What time? 0 points  Count back from 20 0 points  Months in reverse 0 points  Repeat phrase 0 points  Total Score 0    Immunizations Immunization History  Administered Date(s) Administered   Fluad Quad(high Dose 65+) 07/07/2019, 06/14/2020, 05/22/2021   PFIZER Comirnaty(Gray Top)Covid-19 Tri-Sucrose Vaccine 04/08/2021   PFIZER(Purple Top)SARS-COV-2 Vaccination 10/07/2019, 11/01/2019, 06/25/2020   Pneumococcal Conjugate-13 12/24/2017   Pneumococcal Polysaccharide-23 11/22/2019   Zoster, Live 09/01/2010    TDAP status: Due, Education has been provided regarding the importance of this vaccine. Advised may receive this vaccine at local pharmacy or Health Dept. Aware to provide a copy of the vaccination record if obtained from local pharmacy or Health Dept. Verbalized acceptance and  understanding.  Flu Vaccine status: Up to date  Pneumococcal vaccine status: Up to date  Covid-19 vaccine status: Completed vaccines  Qualifies for Shingles Vaccine? Yes   Zostavax completed Yes   Shingrix Completed?: No.    Education has been provided regarding the importance of this vaccine. Patient has been advised to call insurance company to determine out of pocket expense if they have not yet received this vaccine. Advised may also receive vaccine at local pharmacy or Health Dept. Verbalized acceptance and understanding.  Screening Tests Health Maintenance  Topic Date Due   COVID-19 Vaccine (5 - Booster for Pfizer series) 06/03/2021   Zoster Vaccines- Shingrix (1 of 2) 08/21/2021 (Originally 12/22/2002)   TETANUS/TDAP  01/14/2022 (Originally 12/22/1971)   COLONOSCOPY (Pts 45-37yrs Insurance coverage will need to be confirmed)  03/08/2024   Pneumonia Vaccine 60+ Years old  Completed   INFLUENZA VACCINE  Completed   Hepatitis C Screening  Completed   HPV VACCINES  Aged Out    Health Maintenance  Health Maintenance Due  Topic Date Due   COVID-19 Vaccine (5 - Booster for Pfizer series) 06/03/2021    Colorectal cancer screening: Type of screening: Colonoscopy. Completed 03/09/19. Repeat every 5 years  Lung Cancer Screening: (Low Dose CT Chest recommended if Age 43-80 years, 30 pack-year currently smoking OR have quit w/in 15years.) does not qualify.  Additional Screening:  Hepatitis C Screening: does qualify; Completed 11/03/18  Vision Screening: Recommended annual ophthalmology exams for early detection of glaucoma and other disorders of the eye. Is the patient up to date with their annual eye exam?  Yes  Who is the provider or what is the name of the office in which the patient attends annual eye exams? [facility].   Dental Screening: Recommended annual dental exams for proper oral hygiene  Community Resource Referral / Chronic Care Management: CRR required this  visit?  No   CCM required this visit?  No      Plan:     I have personally reviewed and noted the following in the patient's chart:   Medical and social history Use of alcohol, tobacco or illicit drugs  Current medications and supplements including opioid prescriptions. Patient is not currently taking opioid prescriptions. Functional ability and status Nutritional status Physical activity Advanced directives List of other physicians Hospitalizations, surgeries, and ER visits in previous 12 months Vitals Screenings to include cognitive, depression, and falls Referrals and appointments  In addition, I have reviewed and discussed with patient certain preventive protocols, quality metrics, and best practice recommendations. A written personalized care plan for preventive services as well as general preventive health recommendations were provided to patient.     Reather Littler, LPN   [id number]   Nurse Notes: none

## 2021-06-25 DIAGNOSIS — R262 Difficulty in walking, not elsewhere classified: Secondary | ICD-10-CM | POA: Diagnosis not present

## 2021-06-25 DIAGNOSIS — M6281 Muscle weakness (generalized): Secondary | ICD-10-CM | POA: Diagnosis not present

## 2021-06-25 DIAGNOSIS — M25571 Pain in right ankle and joints of right foot: Secondary | ICD-10-CM | POA: Diagnosis not present

## 2021-06-25 DIAGNOSIS — J301 Allergic rhinitis due to pollen: Secondary | ICD-10-CM | POA: Diagnosis not present

## 2021-06-25 DIAGNOSIS — M25674 Stiffness of right foot, not elsewhere classified: Secondary | ICD-10-CM | POA: Diagnosis not present

## 2021-06-27 DIAGNOSIS — M6281 Muscle weakness (generalized): Secondary | ICD-10-CM | POA: Diagnosis not present

## 2021-06-27 DIAGNOSIS — M25674 Stiffness of right foot, not elsewhere classified: Secondary | ICD-10-CM | POA: Diagnosis not present

## 2021-06-27 DIAGNOSIS — M25571 Pain in right ankle and joints of right foot: Secondary | ICD-10-CM | POA: Diagnosis not present

## 2021-06-27 DIAGNOSIS — R262 Difficulty in walking, not elsewhere classified: Secondary | ICD-10-CM | POA: Diagnosis not present

## 2021-06-28 DIAGNOSIS — J4550 Severe persistent asthma, uncomplicated: Secondary | ICD-10-CM | POA: Diagnosis not present

## 2021-07-01 ENCOUNTER — Ambulatory Visit: Payer: Medicare PPO | Admitting: Internal Medicine

## 2021-07-01 ENCOUNTER — Encounter: Payer: Self-pay | Admitting: Internal Medicine

## 2021-07-01 ENCOUNTER — Ambulatory Visit: Payer: Self-pay

## 2021-07-01 ENCOUNTER — Other Ambulatory Visit: Payer: Self-pay

## 2021-07-01 VITALS — BP 120/80 | HR 70 | Temp 98.1°F | Ht 64.0 in | Wt 148.0 lb

## 2021-07-01 DIAGNOSIS — R310 Gross hematuria: Secondary | ICD-10-CM | POA: Diagnosis not present

## 2021-07-01 DIAGNOSIS — J3089 Other allergic rhinitis: Secondary | ICD-10-CM | POA: Insufficient documentation

## 2021-07-01 DIAGNOSIS — E782 Mixed hyperlipidemia: Secondary | ICD-10-CM | POA: Insufficient documentation

## 2021-07-01 DIAGNOSIS — G4700 Insomnia, unspecified: Secondary | ICD-10-CM | POA: Insufficient documentation

## 2021-07-01 LAB — POC URINALYSIS WITH MICROSCOPIC (NON AUTO)MANUAL RESULT
Bilirubin, UA: NEGATIVE
Crystals: 0
Glucose, UA: NEGATIVE
Ketones, UA: NEGATIVE
Leukocytes, UA: NEGATIVE
Mucus, UA: 0
Nitrite, UA: NEGATIVE
Protein, UA: NEGATIVE
RBC: 100 M/uL — AB (ref 4.69–6.13)
Spec Grav, UA: 1.02 (ref 1.010–1.025)
Urobilinogen, UA: 0.2 E.U./dL
WBC Casts, UA: 5
pH, UA: 6 (ref 5.0–8.0)

## 2021-07-01 MED ORDER — SULFAMETHOXAZOLE-TRIMETHOPRIM 800-160 MG PO TABS: 1.0000 | ORAL_TABLET | Freq: Two times a day (BID) | ORAL | 0 refills | Status: AC

## 2021-07-01 NOTE — Progress Notes (Signed)
Date:  07/01/2021   Name:  Blake Patrick   DOB:  [birth year]   MRN:  [id number]   Chief Complaint: Hematuria  Hematuria This is a new problem. The current episode started yesterday. The problem has been gradually worsening since onset. The pain is mild. He describes his urine color as dark red. Irritative symptoms do not include frequency or urgency. Associated symptoms include dysuria. Pertinent negatives include no abdominal pain, fever or nausea. He is not sexually active.   Lab Results  Component Value Date   CREATININE 1.00 04/02/2021   BUN 14 04/02/2021   NA 135 04/02/2021   K 4.1 04/02/2021   CL 107 04/02/2021   CO2 22 04/02/2021   Lab Results  Component Value Date   CHOL 178 05/22/2021   HDL 64 05/22/2021   LDLCALC 92 05/22/2021   TRIG 124 05/22/2021   Lab Results  Component Value Date   TSH 0.69 07/08/2018   Lab Results  Component Value Date   HGBA1C 5.4 04/06/2019   Lab Results  Component Value Date   WBC 5.6 04/02/2021   HGB 14.4 04/02/2021   HCT 40.8 04/02/2021   MCV 93.6 04/02/2021   PLT 238 04/02/2021   Lab Results  Component Value Date   ALT 16 04/02/2021   AST 13 (L) 04/02/2021   ALKPHOS 76 04/02/2021   BILITOT 0.9 04/02/2021     Review of Systems  Constitutional:  Negative for appetite change, fatigue and fever.  Respiratory:  Negative for chest tightness and shortness of breath.   Cardiovascular:  Negative for chest pain.  Gastrointestinal:  Negative for abdominal distention, abdominal pain, blood in stool, diarrhea and nausea.  Genitourinary:  Positive for dysuria and hematuria. Negative for frequency, penile discharge (but mild discomfort at the tip with urination) and urgency.  Musculoskeletal:  Positive for arthralgias (right foot s/p surgery). Negative for back pain.   Patient Active Problem List   Diagnosis Date Noted   Hyperlipidemia, mixed 07/01/2021   Insomnia disorder 07/01/2021   Esophageal dysphagia    Duodenal erythema     Esophagitis    Esophageal stenosis    Severe persistent asthma without complication [id number]   Closed dislocation of tarsal joint of right foot 02/09/2021   Migraines 12/21/2017    No Known Allergies  Past Surgical History:  Procedure Laterality Date   BIOPSY N/A 04/29/2021   Procedure: BIOPSY;  Surgeon: Virgel Manifold, MD;  Location: [place];  Service: Endoscopy;  Laterality: N/A;   ELBOW SURGERY Right 2007   ESOPHAGOGASTRODUODENOSCOPY (EGD) WITH PROPOFOL N/A 04/29/2021   Procedure: ESOPHAGOGASTRODUODENOSCOPY (EGD) WITH PROPOFOL;  Surgeon: Virgel Manifold, MD;  Location: [place];  Service: Endoscopy;  Laterality: N/A;   ETHMOIDECTOMY Bilateral 10/27/2019   Procedure: TOTAL ETHMOIDECTOMY WITH FRONTAL SINUOTOMY;  Surgeon: Margaretha Sheffield, MD;  Location: [place];  Service: ENT;  Laterality: Bilateral;   EYE SURGERY  2019   Replaced   HAND [place]   IMAGE GUIDED SINUS SURGERY Bilateral 10/27/2019   Procedure: IMAGE GUIDED SINUS SURGERY;  Surgeon: Margaretha Sheffield, MD;  Location: [place];  Service: ENT;  Laterality: Bilateral;  need stryker disk placed disk on or charge nurse desk 2-17   [place] Bilateral 04/26/2020   Procedure: IMAGE GUIDED SINUS SURGERY;  Surgeon: Margaretha Sheffield, MD;  Location: [place];  Service: ENT;  Laterality: Bilateral;  need stryker disk gave  Brenda disk on 8-23 kp   MENISCUS REPAIR Left 2018   SEPTOPLASTY Bilateral 10/27/2019   Procedure: SEPTOPLASTY REVISION;  Surgeon: Vernie Murders, MD;  Location: [facility];  Service: ENT;  Laterality: Bilateral;   SPHENOIDECTOMY Right 04/26/2020   Procedure: Selina Cooley;  Surgeon: Vernie Murders, MD;  Location: [facility];  Service: ENT;  Laterality: Right;   TONSILLECTOMY  1964    Social History   Tobacco Use   Smoking status: Former    Packs/day: 1.00    Years: 26.00    Pack  years: 26.00    Types: Cigarettes    Quit date: 09/01/1988    Years since quitting: 32.8   Smokeless tobacco: Never  Vaping Use   Vaping Use: Never used  Substance Use Topics   Alcohol use: Yes    Alcohol/week: 7.0 standard drinks    Types: 7 Cans of beer per week    Comment: Social drinking only   Drug use: Never     Medication list has been reviewed and updated.  Current Meds  Medication Sig   acetaminophen (TYLENOL) 650 MG CR tablet Take 2 tablets by mouth as needed.   acetaZOLAMIDE (DIAMOX) 250 MG tablet Take 250 mg by mouth. Take 1 tablet (250 mg total) by mouth as directed (prn headache q4-6 hours as needed) for up to 10 days   APPLE CIDER VINEGAR PO Take 500 mg by mouth.   budesonide (PULMICORT) 0.5 MG/2ML nebulizer solution Inhale into the lungs.   Calcium Carbonate-Vitamin D (RA CALCIUM PLUS VITAMIN D PO) Take 525 mg by mouth 2 (two) times daily.   Cholecalciferol (VITAMIN D) 50 MCG (2000 UT) CAPS Take 1 capsule by mouth daily.   DULoxetine (CYMBALTA) 60 MG capsule Take 1 capsule by mouth daily.   EPINEPHrine 0.3 mg/0.3 mL IJ SOAJ injection    fexofenadine (ALLEGRA) 180 MG tablet Take 180 mg by mouth daily.   fluticasone (FLONASE) 50 MCG/ACT nasal spray Place 2 sprays into both nostrils daily.   Glucosamine-Chondroitin (GLUCOSAMINE CHONDR COMPLEX PO) Take 3,700 mg by mouth daily.   ibuprofen (ADVIL) 600 MG tablet Take 600 mg by mouth every 6 (six) hours as needed.   ipratropium-albuterol (DUONEB) 0.5-2.5 (3) MG/3ML SOLN Inhale into the lungs.   Lasmiditan Succinate 100 MG TABS Take by mouth. neuro   naratriptan (AMERGE) 2.5 MG tablet    omeprazole (PRILOSEC) 20 MG capsule Take 1 capsule (20 mg total) by mouth in the morning and at bedtime for 60 days, THEN 1 capsule (20 mg total) daily.   polycarbophil (FIBERCON) 625 MG tablet Take 1,250 mg by mouth 2 (two) times daily.   Riboflavin 400 MG TABS Take 1 tablet by mouth daily.   rosuvastatin (CRESTOR) 10 MG tablet TAKE 1  TABLET EVERY DAY  (DUE  FOR  BLOOD  WORK)   sulfamethoxazole-trimethoprim (BACTRIM DS) 800-160 MG tablet Take 1 tablet by mouth 2 (two) times daily for 10 days.   traZODone (DESYREL) 50 MG tablet TAKE 1-2 TABLETS BY MOUTH AT BEDTIME AS NEEDED FOR SLEEP.   vitamin C (ASCORBIC ACID) 500 MG tablet Take 500 mg by mouth daily.   Vitamin E 180 MG CAPS Take 1 capsule by mouth daily.   Vitamins-Lipotropics (LIPO-FLAVONOID PLUS PO) Take 1,000 mg by mouth 2 (two) times daily.   Zinc 50 MG CAPS Take 1 capsule by mouth daily.    PHQ 2/9 Scores 07/01/2021 06/24/2021 05/22/2021 01/14/2021  PHQ - 2 Score 0 0 0 0  PHQ- 9  Score 0 1 5 0    GAD 7 : Generalized Anxiety Score 07/01/2021 05/22/2021 01/14/2021 04/17/2020  Nervous, Anxious, on Edge 0 0 0 0  Control/stop worrying 0 0 0 0  Worry too much - different things 0 0 0 0  Trouble relaxing 0 0 0 0  Restless 0 0 0 0  Easily annoyed or irritable 0 0 0 0  Afraid - awful might happen 0 0 0 0  Total GAD 7 Score 0 0 0 0  Anxiety Difficulty Not difficult at all - - Not difficult at all    BP Readings from Last 3 Encounters:  07/01/21 120/80  06/24/21 118/72  05/22/21 130/84    Physical Exam Vitals and nursing note reviewed.  Constitutional:      General: He is not in acute distress.    Appearance: Normal appearance. He is well-developed.  HENT:     Head: Normocephalic and atraumatic.  Cardiovascular:     Rate and Rhythm: Normal rate and regular rhythm.     Pulses: Normal pulses.  Pulmonary:     Effort: Pulmonary effort is normal. No respiratory distress.     Breath sounds: No wheezing or rhonchi.  Abdominal:     General: Abdomen is flat.     Palpations: Abdomen is soft.     Tenderness: There is no abdominal tenderness. There is no right CVA tenderness, left CVA tenderness, guarding or rebound.  Skin:    General: Skin is warm and dry.     Findings: No rash.  Neurological:     Mental Status: He is alert and oriented to person, place, and time.   Psychiatric:        Mood and Affect: Mood normal.        Behavior: Behavior normal.    Wt Readings from Last 3 Encounters:  07/01/21 148 lb (67.1 kg)  06/24/21 149 lb (67.6 kg)  05/22/21 141 lb (64 kg)    BP 120/80   Pulse 70   Temp 98.1 F (36.7 C) (Oral)   Ht 5\' 4"  (1.626 m)   Wt 148 lb (67.1 kg)   SpO2 97%   BMI 25.40 kg/m   Assessment and Plan: 1. Gross hematuria Has hx of prostatitis but no similar symptoms now - only blood in his urine once yesterday. Will treat with Bactrim for UTI/prostate and refer to Urology - he has seen Dr. Delma Officer in [place] 2 years ago. - POC urinalysis w microscopic (non auto) - sulfamethoxazole-trimethoprim (BACTRIM DS) 800-160 MG tablet; Take 1 tablet by mouth 2 (two) times daily for 10 days.  Dispense: 20 tablet; Refill: 0 - Ambulatory referral to Urology   Partially dictated using [place]. Any errors are unintentional.  Halina Maidens, MD [facility]  07/01/2021

## 2021-07-01 NOTE — Telephone Encounter (Signed)
Pt. Reports he saw bright red blood in urine yesterday. 1 occurrence. Urine is dark in color per pt. Has "a stinging sensation in my penis." No other symptoms. No availability in the practice. Will send triage for review per [facility] in the practice.     Reason for Disposition  Pain or burning with passing urine  Answer Assessment - Initial Assessment Questions 1. COLOR of URINE: "Describe the color of the urine."  (e.g., tea-colored, pink, red, blood clots, bloody)     Bright red 2. ONSET: "When did the bleeding start?"      Yesterday 3. EPISODES: "How many times has there been blood in the urine?" or "How many times today?"     1 4. PAIN with URINATION: "Is there any pain with passing your urine?" If Yes, ask: "How bad is the pain?"  (Scale 1-10; or mild, moderate, severe)    - MILD - complains slightly about urination hurting    - MODERATE - interferes with normal activities      - SEVERE - excruciating, unwilling or unable to urinate because of the pain      Stinging 5. FEVER: "Do you have a fever?" If Yes, ask: "What is your temperature, how was it measured, and when did it start?"     No 6. ASSOCIATED SYMPTOMS: "Are you passing urine more frequently than usual?"     No 7. OTHER SYMPTOMS: "Do you have any other symptoms?" (e.g., back/flank pain, abdominal pain, vomiting)     No 8. PREGNANCY: "Is there any chance you are pregnant?" "When was your last menstrual period?"     N/a  Protocols used: Urine - Blood In-A-AH

## 2021-07-02 DIAGNOSIS — M25674 Stiffness of right foot, not elsewhere classified: Secondary | ICD-10-CM | POA: Diagnosis not present

## 2021-07-02 DIAGNOSIS — J301 Allergic rhinitis due to pollen: Secondary | ICD-10-CM | POA: Diagnosis not present

## 2021-07-02 DIAGNOSIS — M6281 Muscle weakness (generalized): Secondary | ICD-10-CM | POA: Diagnosis not present

## 2021-07-02 DIAGNOSIS — R262 Difficulty in walking, not elsewhere classified: Secondary | ICD-10-CM | POA: Diagnosis not present

## 2021-07-02 DIAGNOSIS — M25571 Pain in right ankle and joints of right foot: Secondary | ICD-10-CM | POA: Diagnosis not present

## 2021-07-04 DIAGNOSIS — R350 Frequency of micturition: Secondary | ICD-10-CM | POA: Diagnosis not present

## 2021-07-04 DIAGNOSIS — N401 Enlarged prostate with lower urinary tract symptoms: Secondary | ICD-10-CM | POA: Diagnosis not present

## 2021-07-04 DIAGNOSIS — R310 Gross hematuria: Secondary | ICD-10-CM | POA: Diagnosis not present

## 2021-07-05 DIAGNOSIS — R262 Difficulty in walking, not elsewhere classified: Secondary | ICD-10-CM | POA: Diagnosis not present

## 2021-07-05 DIAGNOSIS — M25674 Stiffness of right foot, not elsewhere classified: Secondary | ICD-10-CM | POA: Diagnosis not present

## 2021-07-05 DIAGNOSIS — M6281 Muscle weakness (generalized): Secondary | ICD-10-CM | POA: Diagnosis not present

## 2021-07-05 DIAGNOSIS — M25571 Pain in right ankle and joints of right foot: Secondary | ICD-10-CM | POA: Diagnosis not present

## 2021-07-08 ENCOUNTER — Ambulatory Visit: Payer: Medicare PPO | Admitting: Family Medicine

## 2021-07-08 ENCOUNTER — Encounter: Payer: Self-pay | Admitting: Family Medicine

## 2021-07-08 ENCOUNTER — Encounter: Payer: Self-pay | Admitting: Gastroenterology

## 2021-07-08 ENCOUNTER — Ambulatory Visit: Payer: Medicare PPO | Admitting: Gastroenterology

## 2021-07-08 ENCOUNTER — Other Ambulatory Visit: Payer: Self-pay

## 2021-07-08 VITALS — BP 120/78 | HR 60 | Ht 64.0 in | Wt 139.0 lb

## 2021-07-08 VITALS — BP 131/72 | HR 78 | Temp 98.0°F | Wt 138.6 lb

## 2021-07-08 DIAGNOSIS — E785 Hyperlipidemia, unspecified: Secondary | ICD-10-CM | POA: Diagnosis not present

## 2021-07-08 DIAGNOSIS — K209 Esophagitis, unspecified without bleeding: Secondary | ICD-10-CM | POA: Diagnosis not present

## 2021-07-08 DIAGNOSIS — R1319 Other dysphagia: Secondary | ICD-10-CM

## 2021-07-08 DIAGNOSIS — R69 Illness, unspecified: Secondary | ICD-10-CM

## 2021-07-08 MED ORDER — ROSUVASTATIN CALCIUM 10 MG PO TABS: ORAL_TABLET | ORAL | 1 refills | Status: DC

## 2021-07-08 NOTE — Patient Instructions (Signed)

## 2021-07-08 NOTE — Progress Notes (Signed)
Melodie Bouillon, MD [address]  [address]  [place], [place] [address]  Main: [phone]  Fax: [phone]   Primary Care Physician: Duanne Limerick, MD   Chief complaint: Dysphagia  HPI: Blake Patrick is a 70 y.o. male here for follow-up of dysphagia.  Schatzki's ring seen on last procedure and was dilated to 15 mm.  Patient reports complete resolution of dysphagia since then.  Denies any breakthrough heartburn symptoms on current dose of Prilosec.  States never really had heartburn symptoms even prior to Prilosec.  No abdominal pain.  No nausea or vomiting.  No blood in stool or altered bowel habits.  Colonoscopy up-to-date.   ROS: All ROS reviewed and negative except as per HPI   Past Medical History:  Diagnosis Date   Allergy 06/07/20   Went for Allergy Test   Arthritis    hands, fingers   Asthma 02/22/2021   Cataract    Replaced in 2019   Colon polyp    Diverticulosis    GERD (gastroesophageal reflux disease)    occasionally   History of placement of ear tubes    2012- came out shortly after   Hyperlipidemia    Migraine    daily migraines   Sinusitis    chronic   Tendonitis    both ankles   Tinnitus     Past Surgical History:  Procedure Laterality Date   BIOPSY N/A 04/29/2021   Procedure: BIOPSY;  Surgeon: Pasty Spillers, MD;  Location: [facility];  Service: Endoscopy;  Laterality: N/A;   ELBOW SURGERY Right 2007   ESOPHAGOGASTRODUODENOSCOPY (EGD) WITH PROPOFOL N/A 04/29/2021   Procedure: ESOPHAGOGASTRODUODENOSCOPY (EGD) WITH PROPOFOL;  Surgeon: Pasty Spillers, MD;  Location: [facility];  Service: Endoscopy;  Laterality: N/A;   ETHMOIDECTOMY Bilateral 10/27/2019   Procedure: TOTAL ETHMOIDECTOMY WITH FRONTAL SINUOTOMY;  Surgeon: Vernie Murders, MD;  Location: [facility];  Service: ENT;  Laterality: Bilateral;   EYE SURGERY  2019   Replaced   HAND SURGERY  1983   HERNIA REPAIR  1995   IMAGE GUIDED SINUS SURGERY  Bilateral 10/27/2019   Procedure: IMAGE GUIDED SINUS SURGERY;  Surgeon: Vernie Murders, MD;  Location: [facility];  Service: ENT;  Laterality: Bilateral;  need stryker disk placed disk on or charge nurse desk 2-17   kp   IMAGE GUIDED SINUS SURGERY Bilateral 04/26/2020   Procedure: IMAGE GUIDED SINUS SURGERY;  Surgeon: Vernie Murders, MD;  Location: [facility];  Service: ENT;  Laterality: Bilateral;  need stryker disk gave Steward Drone disk on 8-23 kp   MENISCUS REPAIR Left 2018   SEPTOPLASTY Bilateral 10/27/2019   Procedure: SEPTOPLASTY REVISION;  Surgeon: Vernie Murders, MD;  Location: [facility];  Service: ENT;  Laterality: Bilateral;   SPHENOIDECTOMY Right 04/26/2020   Procedure: Selina Cooley;  Surgeon: Vernie Murders, MD;  Location: [facility];  Service: ENT;  Laterality: Right;   TONSILLECTOMY  1964    Prior to Admission medications   Medication Sig Start Date End Date Taking? Authorizing Provider  APPLE CIDER VINEGAR PO Take 500 mg by mouth.   Yes [provider]  budesonide (PULMICORT) 0.5 MG/2ML nebulizer solution Inhale into the lungs. 11/14/20 11/14/21 Yes [provider]  Calcium Carbonate-Vitamin D (RA CALCIUM PLUS VITAMIN D PO) Take 525 mg by mouth 2 (two) times daily.   Yes [provider]  Cholecalciferol (VITAMIN D) 50 MCG (2000 UT) CAPS Take 1 capsule by mouth daily.   Yes  [provider]  DULoxetine (CYMBALTA) 60 MG capsule Take 1 capsule by mouth daily. 06/18/21 06/18/22 Yes [provider]  EPINEPHrine 0.3 mg/0.3 mL IJ SOAJ injection  06/13/20  Yes [provider]  fexofenadine (ALLEGRA) 180 MG tablet Take 180 mg by mouth daily.   Yes [provider]  fluticasone (FLONASE) 50 MCG/ACT nasal spray Place 2 sprays into both nostrils daily. 04/16/20  Yes [provider]  Glucosamine-Chondroitin (GLUCOSAMINE CHONDR COMPLEX PO) Take 3,700 mg by mouth daily.   Yes [provider]   ibuprofen (ADVIL) 600 MG tablet Take 600 mg by mouth every 6 (six) hours as needed.   Yes [provider]  ipratropium-albuterol (DUONEB) 0.5-2.5 (3) MG/3ML SOLN Inhale into the lungs. 11/14/20 11/09/21 Yes [provider]  naratriptan (AMERGE) 2.5 MG tablet  05/24/20  Yes [provider]  omeprazole (PRILOSEC) 20 MG capsule Take 1 capsule (20 mg total) by mouth in the morning and at bedtime for 60 days, THEN 1 capsule (20 mg total) daily. 04/29/21 07/28/21 Yes Pasty Spillers, MD  polycarbophil (FIBERCON) 625 MG tablet Take 1,250 mg by mouth 2 (two) times daily.   Yes [provider]  Riboflavin 400 MG TABS Take 1 tablet by mouth daily.   Yes [provider]  rosuvastatin (CRESTOR) 10 MG tablet TAKE 1 TABLET EVERY DAY  (DUE  FOR  BLOOD  WORK) 05/22/21  Yes Duanne Limerick, MD  sulfamethoxazole-trimethoprim (BACTRIM DS) 800-160 MG tablet Take 1 tablet by mouth 2 (two) times daily for 10 days. 07/01/21 07/11/21 Yes Reubin Milan, MD  traZODone (DESYREL) 50 MG tablet TAKE 1-2 TABLETS BY MOUTH AT BEDTIME AS NEEDED FOR SLEEP. 06/14/21  Yes Duanne Limerick, MD  vitamin C (ASCORBIC ACID) 500 MG tablet Take 500 mg by mouth daily.   Yes [provider]  Vitamin E 180 MG CAPS Take 1 capsule by mouth daily.   Yes [provider]  Vitamins-Lipotropics (LIPO-FLAVONOID PLUS PO) Take 1,000 mg by mouth 2 (two) times daily.   Yes [provider]  Zinc 50 MG CAPS Take 1 capsule by mouth daily.   Yes [provider]    Family History  Problem Relation Age of Onset   Cancer Father      Social History   Tobacco Use   Smoking status: Former    Packs/day: 1.00    Years: 26.00    Pack years: 26.00    Types: Cigarettes    Quit date: 09/01/1988    Years since quitting: 32.8   Smokeless tobacco: Never  Vaping Use   Vaping Use: Never used  Substance Use Topics   Alcohol use: Yes    Alcohol/week: 7.0 standard drinks     Types: 7 Cans of beer per week    Comment: Social drinking only   Drug use: Never    Allergies as of 07/08/2021   (No Known Allergies)    Physical Examination:  Constitutional: General:   Alert,  Well-developed, well-nourished, pleasant and cooperative in NAD BP 131/72   Pulse 78   Temp 98 F (36.7 C) (Oral)   Wt 138 lb 9.6 oz (62.9 kg)   BMI 23.79 kg/m   Respiratory: Normal respiratory effort  Gastrointestinal:  Soft, non-tender and non-distended without masses, hepatosplenomegaly or hernias noted.  No guarding or rebound tenderness.     Cardiac: No clubbing or edema.  No cyanosis. Normal posterior tibial pedal pulses noted.  Psych:  Alert and cooperative. Normal mood  and affect.  Musculoskeletal:  Normal gait. Head normocephalic, atraumatic. Symmetrical without gross deformities. 5/5 Lower extremity strength bilaterally.  Skin: Warm. Intact without significant lesions or rashes. No jaundice.  Neck: Supple, trachea midline  Lymph: No cervical lymphadenopathy  Psych:  Alert and oriented x3, Alert and cooperative. Normal mood and affect.  Labs: CMP     Component Value Date/Time   NA 135 04/02/2021 1829   NA 139 11/03/2019 0945   K 4.1 04/02/2021 1829   CL 107 04/02/2021 1829   CO2 22 04/02/2021 1829   GLUCOSE 92 04/02/2021 1829   BUN 14 04/02/2021 1829   BUN 16 11/03/2019 0945   CREATININE 1.00 04/02/2021 1829   CALCIUM 9.3 04/02/2021 1829   PROT 7.2 04/02/2021 1829   PROT 6.7 01/14/2021 1159   ALBUMIN 4.3 04/02/2021 1829   ALBUMIN 4.6 01/14/2021 1159   AST 13 (L) 04/02/2021 1829   ALT 16 04/02/2021 1829   ALKPHOS 76 04/02/2021 1829   BILITOT 0.9 04/02/2021 1829   BILITOT 0.5 01/14/2021 1159   GFRNONAA >60 04/02/2021 1829   GFRAA 72 11/03/2019 0945   Lab Results  Component Value Date   WBC 5.6 04/02/2021   HGB 14.4 04/02/2021   HCT 40.8 04/02/2021   MCV 93.6 04/02/2021   PLT 238 04/02/2021    Imaging Studies:   Assessment and Plan:   Blake Patrick is a 70 y.o. y/o male here for follow-up of dysphagia, esophagitis  Dysphagia has completely resolved since previous dilation Would recommend repeat upper endoscopy for reevaluation of esophagitis seen previously and biopsies of salmon-colored mucosa previously seen and unable to be biopsied due to presence of esophagitis on last procedure  Continue current dose of Prilosec until procedure is completed  (Risks of PPI use were discussed with patient including bone loss, C. Diff diarrhea, pneumonia, infections, CKD, electrolyte abnormalities.  Pt. Verbalizes understanding and chooses to continue the medication.)  Patient educated extensively on acid reflux lifestyle modification, including using a bed wedge, not eating 3 hrs before bedtime, diet modifications, and handout given for the same.   I have discussed alternative options, risks & benefits,  which include, but are not limited to, bleeding, infection, perforation,respiratory complication & drug reaction.  The patient agrees with this plan & written consent will be obtained.     Dr Melodie Bouillon

## 2021-07-08 NOTE — Progress Notes (Signed)
Date:  07/08/2021   Name:  Blake Patrick   DOB:  04/15/1951   MRN:  [id number]   Chief Complaint: Hyperlipidemia  Hyperlipidemia This is a chronic problem. The current episode started more than 1 year ago. The problem is controlled. Recent lipid tests were reviewed and are normal. He has no history of chronic renal disease, diabetes, hypothyroidism, liver disease, obesity or nephrotic syndrome. Pertinent negatives include no chest pain, focal sensory loss, focal weakness, leg pain, myalgias or shortness of breath. Current antihyperlipidemic treatment includes statins. The current treatment provides moderate improvement of lipids. There are no compliance problems.  Risk factors for coronary artery disease include dyslipidemia.   Lab Results  Component Value Date   CREATININE 1.00 04/02/2021   BUN 14 04/02/2021   NA 135 04/02/2021   K 4.1 04/02/2021   CL 107 04/02/2021   CO2 22 04/02/2021   Lab Results  Component Value Date   CHOL 178 05/22/2021   HDL 64 05/22/2021   LDLCALC 92 05/22/2021   TRIG 124 05/22/2021   Lab Results  Component Value Date   TSH 0.69 07/08/2018   Lab Results  Component Value Date   HGBA1C 5.4 04/06/2019   Lab Results  Component Value Date   WBC 5.6 04/02/2021   HGB 14.4 04/02/2021   HCT 40.8 04/02/2021   MCV 93.6 04/02/2021   PLT 238 04/02/2021   Lab Results  Component Value Date   ALT 16 04/02/2021   AST 13 (L) 04/02/2021   ALKPHOS 76 04/02/2021   BILITOT 0.9 04/02/2021     Review of Systems  Respiratory:  Negative for shortness of breath.   Cardiovascular:  Negative for chest pain.  Musculoskeletal:  Negative for myalgias.  Neurological:  Negative for focal weakness.   Patient Active Problem List   Diagnosis Date Noted   Hyperlipidemia, mixed 07/01/2021   Insomnia disorder 07/01/2021   Environmental and seasonal allergies 07/01/2021   Esophageal dysphagia    Duodenal erythema    Esophagitis    Esophageal stenosis    Severe  persistent asthma without complication 02/22/2021   Closed dislocation of tarsal joint of right foot 02/09/2021   Migraines 12/21/2017    No Known Allergies  Past Surgical History:  Procedure Laterality Date   BIOPSY N/A 04/29/2021   Procedure: BIOPSY;  Surgeon: Pasty Spillers, MD;  Location: [facility];  Service: Endoscopy;  Laterality: N/A;   ELBOW SURGERY Right 2007   ESOPHAGOGASTRODUODENOSCOPY (EGD) WITH PROPOFOL N/A 04/29/2021   Procedure: ESOPHAGOGASTRODUODENOSCOPY (EGD) WITH PROPOFOL;  Surgeon: Pasty Spillers, MD;  Location: [facility];  Service: Endoscopy;  Laterality: N/A;   ETHMOIDECTOMY Bilateral 10/27/2019   Procedure: TOTAL ETHMOIDECTOMY WITH FRONTAL SINUOTOMY;  Surgeon: Vernie Murders, MD;  Location: [facility];  Service: ENT;  Laterality: Bilateral;   EYE SURGERY  2019   Replaced   HAND SURGERY  1983   HERNIA REPAIR  1995   IMAGE GUIDED SINUS SURGERY Bilateral 10/27/2019   Procedure: IMAGE GUIDED SINUS SURGERY;  Surgeon: Vernie Murders, MD;  Location: [facility];  Service: ENT;  Laterality: Bilateral;  need stryker disk placed disk on or charge nurse desk 2-17   kp   IMAGE GUIDED SINUS SURGERY Bilateral 04/26/2020   Procedure: IMAGE GUIDED SINUS SURGERY;  Surgeon: Vernie Murders, MD;  Location: [facility];  Service: ENT;  Laterality: Bilateral;  need stryker disk gave Brenda disk on 8-23 kp   MENISCUS REPAIR Left 2018   SEPTOPLASTY  Bilateral 10/27/2019   Procedure: SEPTOPLASTY REVISION;  Surgeon: Vernie Murders, MD;  Location: [facility];  Service: ENT;  Laterality: Bilateral;   SPHENOIDECTOMY Right 04/26/2020   Procedure: Selina Cooley;  Surgeon: Vernie Murders, MD;  Location: [facility];  Service: ENT;  Laterality: Right;   TONSILLECTOMY  1964    Social History   Tobacco Use   Smoking status: Former    Packs/day: 1.00    Years: 26.00    Pack years: 26.00    Types: Cigarettes    Quit date:  09/01/1988    Years since quitting: 32.8   Smokeless tobacco: Never  Vaping Use   Vaping Use: Never used  Substance Use Topics   Alcohol use: Yes    Alcohol/week: 7.0 standard drinks    Types: 7 Cans of beer per week    Comment: Social drinking only   Drug use: Never     Medication list has been reviewed and updated.  Current Meds  Medication Sig   APPLE CIDER VINEGAR PO Take 500 mg by mouth.   budesonide (PULMICORT) 0.5 MG/2ML nebulizer solution Inhale into the lungs.   Calcium Carbonate-Vitamin D (RA CALCIUM PLUS VITAMIN D PO) Take 525 mg by mouth 2 (two) times daily.   Cholecalciferol (VITAMIN D) 50 MCG (2000 UT) CAPS Take 1 capsule by mouth daily.   Coenzyme Q10 (COQ-10) 100 MG CAPS Take 1 each by mouth 2 (two) times daily.   DULoxetine (CYMBALTA) 60 MG capsule Take 1 capsule by mouth daily.   EPINEPHrine 0.3 mg/0.3 mL IJ SOAJ injection    fexofenadine (ALLEGRA) 180 MG tablet Take 180 mg by mouth daily.   fluticasone (FLONASE) 50 MCG/ACT nasal spray Place 2 sprays into both nostrils daily.   gabapentin (NEURONTIN) 600 MG tablet Take 600 mg by mouth 3 (three) times daily.   Glucosamine-Chondroitin (GLUCOSAMINE CHONDR COMPLEX PO) Take 3,700 mg by mouth daily.   ibuprofen (ADVIL) 600 MG tablet Take 600 mg by mouth every 6 (six) hours as needed.   ipratropium-albuterol (DUONEB) 0.5-2.5 (3) MG/3ML SOLN Inhale into the lungs.   Lasmiditan Succinate (REYVOW) 100 MG TABS Take by mouth.   naproxen (NAPROSYN) 500 MG tablet Take 500 mg by mouth 2 (two) times daily with a meal.   naratriptan (AMERGE) 2.5 MG tablet    omeprazole (PRILOSEC) 20 MG capsule Take 1 capsule (20 mg total) by mouth in the morning and at bedtime for 60 days, THEN 1 capsule (20 mg total) daily.   polycarbophil (FIBERCON) 625 MG tablet Take 1,250 mg by mouth 2 (two) times daily.   prochlorperazine (COMPAZINE) 10 MG tablet Take 10 mg by mouth every 6 (six) hours as needed for nausea or vomiting.   Riboflavin 400 MG  TABS Take 1 tablet by mouth daily.   rosuvastatin (CRESTOR) 10 MG tablet TAKE 1 TABLET EVERY DAY  (DUE  FOR  BLOOD  WORK)   sulfamethoxazole-trimethoprim (BACTRIM DS) 800-160 MG tablet Take 1 tablet by mouth 2 (two) times daily for 10 days.   traMADol-acetaminophen (ULTRACET) 37.5-325 MG tablet Take 1 tablet by mouth every 6 (six) hours as needed.   traZODone (DESYREL) 50 MG tablet TAKE 1-2 TABLETS BY MOUTH AT BEDTIME AS NEEDED FOR SLEEP.   vitamin C (ASCORBIC ACID) 500 MG tablet Take 500 mg by mouth daily.   Vitamin E 180 MG CAPS Take 1 capsule by mouth daily.   Vitamins-Lipotropics (LIPO-FLAVONOID PLUS PO) Take 1,000 mg by mouth 2 (two) times daily.   Zinc 50  MG CAPS Take 1 capsule by mouth daily.    PHQ 2/9 Scores 07/08/2021 07/01/2021 06/24/2021 05/22/2021  PHQ - 2 Score 0 0 0 0  PHQ- 9 Score 0 0 1 5    GAD 7 : Generalized Anxiety Score 07/08/2021 07/01/2021 05/22/2021 01/14/2021  Nervous, Anxious, on Edge 0 0 0 0  Control/stop worrying 0 0 0 0  Worry too much - different things 0 0 0 0  Trouble relaxing 0 0 0 0  Restless 0 0 0 0  Easily annoyed or irritable 0 0 0 0  Afraid - awful might happen 0 0 0 0  Total GAD 7 Score 0 0 0 0  Anxiety Difficulty - Not difficult at all - -    BP Readings from Last 3 Encounters:  07/08/21 120/78  07/08/21 131/72  07/01/21 120/80    Physical Exam  Wt Readings from Last 3 Encounters:  07/08/21 139 lb (63 kg)  07/08/21 138 lb 9.6 oz (62.9 kg)  07/01/21 148 lb (67.1 kg)    BP 120/78   Pulse 60   Ht 5\' 4"  (1.626 m)   Wt 139 lb (63 kg)   BMI 23.86 kg/m   Assessment and Plan:  1. Hyperlipidemia, unspecified hyperlipidemia type Chronic.  Controlled.  Stable.  Oncoming complicated.  Uncontrolled.  Continue rosuvastatin 10 mg once a day.  Will check lipid panel for current level of control and hepatic panel for medication complication. - Lipid Panel With LDL/HDL Ratio - Hepatic function panel - rosuvastatin (CRESTOR) 10 MG tablet; TAKE 1  TABLET EVERY DAY  (DUE  FOR  BLOOD  WORK)  Dispense: 90 tablet; Refill: 1  2. Taking medication for chronic disease Patient is on a statin and we will will check for elevated liver enzymes in the meantime exam was negative for hepatomegaly or splenomegaly.  There is no palpable tenderness. - Hepatic function panel

## 2021-07-08 NOTE — Addendum Note (Signed)
Addended by: Roena Malady on: 07/08/2021 03:48 PM   Modules accepted: Orders, SmartSet

## 2021-07-09 ENCOUNTER — Encounter: Payer: Self-pay | Admitting: Gastroenterology

## 2021-07-09 ENCOUNTER — Other Ambulatory Visit: Payer: Self-pay

## 2021-07-09 DIAGNOSIS — M6281 Muscle weakness (generalized): Secondary | ICD-10-CM | POA: Diagnosis not present

## 2021-07-09 DIAGNOSIS — R262 Difficulty in walking, not elsewhere classified: Secondary | ICD-10-CM | POA: Diagnosis not present

## 2021-07-09 DIAGNOSIS — M25571 Pain in right ankle and joints of right foot: Secondary | ICD-10-CM | POA: Diagnosis not present

## 2021-07-09 DIAGNOSIS — J301 Allergic rhinitis due to pollen: Secondary | ICD-10-CM | POA: Diagnosis not present

## 2021-07-09 DIAGNOSIS — M25674 Stiffness of right foot, not elsewhere classified: Secondary | ICD-10-CM | POA: Diagnosis not present

## 2021-07-09 LAB — LIPID PANEL WITH LDL/HDL RATIO
Cholesterol, Total: 152 mg/dL (ref 100–199)
HDL: 81 mg/dL (ref >39)
LDL Chol Calc (NIH): 59 mg/dL (ref 0–99)
LDL/HDL Ratio: 0.7 ratio (ref 0.0–3.6)
Triglycerides: 57 mg/dL (ref 0–149)
VLDL Cholesterol Cal: 12 mg/dL (ref 5–40)

## 2021-07-09 LAB — HEPATIC FUNCTION PANEL
ALT: 19 IU/L (ref 0–44)
AST: 17 IU/L (ref 0–40)
Albumin: 4.6 g/dL (ref 3.8–4.8)
Alkaline Phosphatase: 126 IU/L — ABNORMAL HIGH (ref 44–121)
Bilirubin Total: 0.3 mg/dL (ref 0.0–1.2)
Bilirubin, Direct: 0.13 mg/dL (ref 0.00–0.40)
Total Protein: 6.7 g/dL (ref 6.0–8.5)

## 2021-07-12 DIAGNOSIS — M25674 Stiffness of right foot, not elsewhere classified: Secondary | ICD-10-CM | POA: Diagnosis not present

## 2021-07-12 DIAGNOSIS — M25571 Pain in right ankle and joints of right foot: Secondary | ICD-10-CM | POA: Diagnosis not present

## 2021-07-12 DIAGNOSIS — R262 Difficulty in walking, not elsewhere classified: Secondary | ICD-10-CM | POA: Diagnosis not present

## 2021-07-12 DIAGNOSIS — M6281 Muscle weakness (generalized): Secondary | ICD-10-CM | POA: Diagnosis not present

## 2021-07-16 DIAGNOSIS — M25674 Stiffness of right foot, not elsewhere classified: Secondary | ICD-10-CM | POA: Diagnosis not present

## 2021-07-16 DIAGNOSIS — R262 Difficulty in walking, not elsewhere classified: Secondary | ICD-10-CM | POA: Diagnosis not present

## 2021-07-16 DIAGNOSIS — M6281 Muscle weakness (generalized): Secondary | ICD-10-CM | POA: Diagnosis not present

## 2021-07-16 DIAGNOSIS — M25571 Pain in right ankle and joints of right foot: Secondary | ICD-10-CM | POA: Diagnosis not present

## 2021-07-16 DIAGNOSIS — J301 Allergic rhinitis due to pollen: Secondary | ICD-10-CM | POA: Diagnosis not present

## 2021-07-17 ENCOUNTER — Other Ambulatory Visit: Payer: Self-pay

## 2021-07-17 MED ORDER — TRAZODONE HCL 50 MG PO TABS: 50.0000 mg | ORAL_TABLET | Freq: Every evening | ORAL | 0 refills | Status: DC | PRN

## 2021-07-18 DIAGNOSIS — M25674 Stiffness of right foot, not elsewhere classified: Secondary | ICD-10-CM | POA: Diagnosis not present

## 2021-07-18 DIAGNOSIS — R262 Difficulty in walking, not elsewhere classified: Secondary | ICD-10-CM | POA: Diagnosis not present

## 2021-07-18 DIAGNOSIS — M6281 Muscle weakness (generalized): Secondary | ICD-10-CM | POA: Diagnosis not present

## 2021-07-18 DIAGNOSIS — M25571 Pain in right ankle and joints of right foot: Secondary | ICD-10-CM | POA: Diagnosis not present

## 2021-07-21 ENCOUNTER — Other Ambulatory Visit: Payer: Self-pay | Admitting: Gastroenterology

## 2021-07-22 ENCOUNTER — Ambulatory Visit: Payer: Medicare PPO | Admitting: Anesthesiology

## 2021-07-22 ENCOUNTER — Encounter
Admission: RE | Disposition: A | Discharge status: Home / Self Care | Payer: Self-pay | Source: Home / Self Care | Attending: Gastroenterology

## 2021-07-22 ENCOUNTER — Encounter: Payer: Self-pay | Admitting: Gastroenterology

## 2021-07-22 ENCOUNTER — Other Ambulatory Visit: Payer: Self-pay

## 2021-07-22 ENCOUNTER — Ambulatory Visit
Admission: RE | Admit: 2021-07-22 | Discharge: 2021-07-22 | Disposition: A | Discharge status: Home / Self Care | Payer: Medicare PPO | Attending: Gastroenterology | Admitting: Gastroenterology

## 2021-07-22 DIAGNOSIS — K2270 Barrett's esophagus without dysplasia: Secondary | ICD-10-CM | POA: Diagnosis not present

## 2021-07-22 DIAGNOSIS — E785 Hyperlipidemia, unspecified: Secondary | ICD-10-CM | POA: Insufficient documentation

## 2021-07-22 DIAGNOSIS — K2100 Gastro-esophageal reflux disease with esophagitis, without bleeding: Secondary | ICD-10-CM | POA: Diagnosis not present

## 2021-07-22 DIAGNOSIS — Z87891 Personal history of nicotine dependence: Secondary | ICD-10-CM | POA: Insufficient documentation

## 2021-07-22 DIAGNOSIS — J45909 Unspecified asthma, uncomplicated: Secondary | ICD-10-CM | POA: Diagnosis not present

## 2021-07-22 DIAGNOSIS — K2289 Other specified disease of esophagus: Secondary | ICD-10-CM | POA: Diagnosis not present

## 2021-07-22 DIAGNOSIS — K209 Esophagitis, unspecified without bleeding: Secondary | ICD-10-CM | POA: Diagnosis not present

## 2021-07-22 DIAGNOSIS — K449 Diaphragmatic hernia without obstruction or gangrene: Secondary | ICD-10-CM

## 2021-07-22 HISTORY — PX: ESOPHAGOGASTRODUODENOSCOPY (EGD) WITH PROPOFOL: SHX5813

## 2021-07-22 SURGERY — ESOPHAGOGASTRODUODENOSCOPY (EGD) WITH PROPOFOL
Anesthesia: General | Site: Throat

## 2021-07-22 MED ORDER — STERILE WATER FOR IRRIGATION IR SOLN
Status: DC | PRN
  Administered 2021-07-22: 250 mL

## 2021-07-22 MED ORDER — GLYCOPYRROLATE 0.2 MG/ML IJ SOLN
INTRAMUSCULAR | Status: DC | PRN
  Administered 2021-07-22: .1 mg via INTRAVENOUS

## 2021-07-22 MED ORDER — LACTATED RINGERS IV SOLN: INTRAVENOUS | Status: DC

## 2021-07-22 MED ORDER — PROPOFOL 10 MG/ML IV BOLUS
INTRAVENOUS | Status: DC | PRN
  Administered 2021-07-22 (×3): 30 mg via INTRAVENOUS
  Administered 2021-07-22: 80 mg via INTRAVENOUS

## 2021-07-22 MED ORDER — OMEPRAZOLE 20 MG PO CPDR: 20.0000 mg | DELAYED_RELEASE_CAPSULE | Freq: Every day | ORAL | 0 refills | Status: DC

## 2021-07-22 MED ORDER — SODIUM CHLORIDE 0.9 % IV SOLN: INTRAVENOUS | Status: DC

## 2021-07-22 SURGICAL SUPPLY — 8 items
BLOCK BITE 60FR ADLT L/F GRN (MISCELLANEOUS) ×2 IMPLANT
FORCEPS BIOP RAD 4 LRG CAP 4 (CUTTING FORCEPS) ×2 IMPLANT
GOWN CVR UNV OPN BCK APRN NK (MISCELLANEOUS) ×2 IMPLANT
GOWN ISOL THUMB LOOP REG UNIV (MISCELLANEOUS) ×4
KIT PRC NS LF DISP ENDO (KITS) ×1 IMPLANT
KIT PROCEDURE OLYMPUS (KITS) ×2
MANIFOLD NEPTUNE II (INSTRUMENTS) ×2 IMPLANT
WATER STERILE IRR 250ML POUR (IV SOLUTION) ×2 IMPLANT

## 2021-07-22 NOTE — Anesthesia Postprocedure Evaluation (Signed)
Anesthesia Post Note  Patient: Blake Patrick  Procedure(s) Performed: ESOPHAGOGASTRODUODENOSCOPY (EGD) WITH PROPOFOL (Throat)     Patient location during evaluation: PACU Anesthesia Type: General Level of consciousness: awake and alert Pain management: pain level controlled Vital Signs Assessment: post-procedure vital signs reviewed and stable Respiratory status: spontaneous breathing, nonlabored ventilation, respiratory function stable and patient connected to nasal cannula oxygen Cardiovascular status: blood pressure returned to baseline and stable Postop Assessment: no apparent nausea or vomiting Anesthetic complications: no   No notable events documented.  Edwyna Ready

## 2021-07-22 NOTE — Anesthesia Preprocedure Evaluation (Signed)
Anesthesia Evaluation  Patient identified by MRN, date of birth, ID band Patient awake    Reviewed: Allergy & Precautions, NPO status , Patient's Chart, lab work & pertinent test results, reviewed documented beta blocker date and time   History of Anesthesia Complications Negative for: history of anesthetic complications  Airway Mallampati: III  TM Distance: >3 FB Neck ROM: Full    Dental no notable dental hx.    Pulmonary asthma , former smoker,    Pulmonary exam normal breath sounds clear to auscultation       Cardiovascular Exercise Tolerance: Good (-) angina(-) DOE Normal cardiovascular exam Rhythm:Regular Rate:Normal   HLD   Neuro/Psych  Headaches,    GI/Hepatic GERD  , Diverticulosis   Endo/Other    Renal/GU      Musculoskeletal  (+) Arthritis ,   Abdominal Normal abdominal exam  (+) - obese,  Abdomen: soft.    Peds  Hematology   Anesthesia Other Findings   Reproductive/Obstetrics                             Anesthesia Physical  Anesthesia Plan  ASA: 2  Anesthesia Plan: General   Post-op Pain Management:    Induction: Intravenous  PONV Risk Score and Plan: 2 and Propofol infusion, TIVA and Treatment may vary due to age or medical condition  Airway Management Planned: Natural Airway and Nasal Cannula  Additional Equipment:   Intra-op Plan:   Post-operative Plan:   Informed Consent: I have reviewed the patients History and Physical, chart, labs and discussed the procedure including the risks, benefits and alternatives for the proposed anesthesia with the patient or authorized representative who has indicated his/her understanding and acceptance.       Plan Discussed with: CRNA and Anesthesiologist  Anesthesia Plan Comments:         Anesthesia Quick Evaluation  Patient Active Problem List   Diagnosis Date Noted  . Hyperlipidemia, mixed 07/01/2021   . Insomnia disorder 07/01/2021  . Environmental and seasonal allergies 07/01/2021  . Esophageal dysphagia   . Duodenal erythema   . Esophagitis   . Esophageal stenosis   . Severe persistent asthma without complication 02/22/2021  . Closed dislocation of tarsal joint of right foot 02/09/2021  . Migraines 12/21/2017    CBC Latest Ref Rng & Units 04/02/2021  WBC 4.0 - 10.5 K/uL 5.6  Hemoglobin 13.0 - 17.0 g/dL 27.0  Hematocrit 62.3 - 52.0 % 40.8  Platelets 150 - 400 K/uL 238   BMP Latest Ref Rng & Units 04/02/2021 11/03/2019 07/08/2018  Glucose 70 - 99 mg/dL 92 762(G) -  BUN 8 - 23 mg/dL 14 16 21   Creatinine 0.61 - 1.24 mg/dL 3.15 1.3  BUN/Creat Ratio 10 - 24 - 13 -  Sodium 135 - 145 mmol/L 135 139 139  Potassium 3.5 - 5.1 mmol/L 4.1 4.7 4.8  Chloride 98 - 111 mmol/L 107 99 104  CO2 22 - 32 mmol/L 22 24 -  Calcium 8.9 - 10.3 mg/dL 9.3 9.3 9.3    Risks and benefits of anesthesia discussed at length, patient or surrogate demonstrates understanding. Appropriately NPO. Plan to proceed with anesthesia.  1.76, MD 07/22/21

## 2021-07-22 NOTE — Op Note (Signed)
[facility] Patient Name: Blake Patrick Procedure Date: 07/22/2021 10:04 AM MRN: [id number] Account #: [id number] Date of Birth: 1951/05/29 Admit Type: Outpatient Age: 70 Room: [facility] OR ROOM 01 Gender: Male Note Status: Finalized Instrument Name: [id number] Procedure:             Upper GI endoscopy Indications:           Follow-up of esophagitis Providers:             Daisie Haft B. Maximino Greenland MD, MD Referring MD:          Duanne Limerick, MD (Referring MD) Medicines:             Monitored Anesthesia Care Complications:         No immediate complications. Procedure:             Pre-Anesthesia Assessment:                        - Prior to the procedure, a History and Physical was                         performed, and patient medications, allergies and                         sensitivities were reviewed. The patient's tolerance                         of previous anesthesia was reviewed.                        - The risks and benefits of the procedure and the                         sedation options and risks were discussed with the                         patient. All questions were answered and informed                         consent was obtained.                        - Patient identification and proposed procedure were                         verified prior to the procedure by the physician, the                         nurse, the anesthesiologist, the anesthetist and the                         technician. The procedure was verified in the                         procedure room.                        - ASA Grade Assessment: II - A patient with mild  systemic disease.                        After obtaining informed consent, the endoscope was                         passed under direct vision. Throughout the procedure,                         the patient's blood pressure, pulse, and oxygen                         saturations  were monitored continuously. The Endoscope                         was introduced through the mouth, and advanced to the                         second part of duodenum. The upper GI endoscopy was                         accomplished with ease. The patient tolerated the                         procedure well. Findings:      One tongue of salmon-colored mucosa was present. The maximum       longitudinal extent of these esophageal mucosal changes was 1 cm in       length. Mucosa was biopsied with a cold forceps for histology.      The exam of the esophagus was otherwise normal.      A small hiatal hernia was present.      The exam of the stomach was otherwise normal.      The entire examined stomach was normal.      The duodenal bulb, second portion of the duodenum and examined duodenum       were normal. Impression:            - Salmon-colored mucosa suspicious for short-segment                         Barrett's esophagus. Biopsied.                        - Small hiatal hernia.                        - Normal stomach.                        - Normal duodenal bulb, second portion of the duodenum                         and examined duodenum. Recommendation:        - Await pathology results.                        - Use Prilosec (omeprazole) 20 mg PO daily.                        - Discharge patient to home (with escort).                        -  Advance diet as tolerated.                        - Continue present medications.                        - Patient has a contact number available for                         emergencies. The signs and symptoms of potential                         delayed complications were discussed with the patient.                         Return to normal activities tomorrow. Written                         discharge instructions were provided to the patient.                        - Discharge patient to home (with escort).                        - The  findings and recommendations were discussed with                         the patient.                        - The findings and recommendations were discussed with                         the patient's family.                        - Follow an antireflux regimen. Procedure Code(s):     --- Professional ---                        [phone], Esophagogastroduodenoscopy, flexible,                         transoral; with biopsy, single or multiple Diagnosis Code(s):     --- Professional ---                        K22.8, Other specified diseases of esophagus                        K20.90, Esophagitis, unspecified without bleeding CPT copyright 2019 American Medical Association. All rights reserved. The codes documented in this report are preliminary and upon coder review may  be revised to meet current compliance requirements.  Melodie Bouillon, MD Michel Bickers B. Maximino Greenland MD, MD 07/22/2021 11:51:48 AM This report has been signed electronically. Number of Addenda: 0 Note Initiated On: 07/22/2021 10:04 AM Estimated Blood Loss:  Estimated blood loss: none.      [facility]

## 2021-07-22 NOTE — Transfer of Care (Signed)
Immediate Anesthesia Transfer of Care Note  Patient: Blake Patrick  Procedure(s) Performed: ESOPHAGOGASTRODUODENOSCOPY (EGD) WITH PROPOFOL (Throat)  Patient Location: PACU  Anesthesia Type: General  Level of Consciousness: awake, alert  and patient cooperative  Airway and Oxygen Therapy: Patient Spontanous Breathing and Patient connected to supplemental oxygen  Post-op Assessment: Post-op Vital signs reviewed, Patient's Cardiovascular Status Stable, Respiratory Function Stable, Patent Airway and No signs of Nausea or vomiting  Post-op Vital Signs: Reviewed and stable  Complications: No notable events documented.

## 2021-07-22 NOTE — Anesthesia Procedure Notes (Signed)
Date/Time: 07/22/2021 11:37 AM Performed by: Lily Kocher, CRNA Pre-anesthesia Checklist: Patient identified, Emergency Drugs available, Suction available, Patient being monitored and Timeout performed Patient Re-evaluated:Patient Re-evaluated prior to induction Oxygen Delivery Method: Nasal cannula Induction Type: IV induction Placement Confirmation: positive ETCO2

## 2021-07-22 NOTE — H&P (Signed)
Melodie Bouillon, MD [address], [place], [place], [address] [address], [address], [place], [place], [address] Phone: [phone]  Fax: [phone]  Primary Care Physician:  Duanne Limerick, MD   Pre-Procedure History & Physical: HPI:  Blake Patrick is a 70 y.o. male is here for an EGD.   Past Medical History:  Diagnosis Date   Allergy 06/07/20   Went for Allergy Test   Arthritis    hands, fingers   Asthma 02/22/2021   Cataract    Replaced in 2019   Colon polyp    Diverticulosis    GERD (gastroesophageal reflux disease)    occasionally   History of placement of ear tubes    2012- came out shortly after   Hyperlipidemia    Migraine    daily migraines   Sinusitis    chronic   Tendonitis    both ankles   Tinnitus     Past Surgical History:  Procedure Laterality Date   BIOPSY N/A 04/29/2021   Procedure: BIOPSY;  Surgeon: Pasty Spillers, MD;  Location: [facility];  Service: Endoscopy;  Laterality: N/A;   ELBOW SURGERY Right 2007   ESOPHAGOGASTRODUODENOSCOPY (EGD) WITH PROPOFOL N/A 04/29/2021   Procedure: ESOPHAGOGASTRODUODENOSCOPY (EGD) WITH PROPOFOL;  Surgeon: Pasty Spillers, MD;  Location: [facility];  Service: Endoscopy;  Laterality: N/A;   ETHMOIDECTOMY Bilateral 10/27/2019   Procedure: TOTAL ETHMOIDECTOMY WITH FRONTAL SINUOTOMY;  Surgeon: Vernie Murders, MD;  Location: [facility];  Service: ENT;  Laterality: Bilateral;   EYE SURGERY  2019   Replaced   HAND SURGERY  1983   HERNIA REPAIR  1995   IMAGE GUIDED SINUS SURGERY Bilateral 10/27/2019   Procedure: IMAGE GUIDED SINUS SURGERY;  Surgeon: Vernie Murders, MD;  Location: [facility];  Service: ENT;  Laterality: Bilateral;  need stryker disk placed disk on or charge nurse desk 2-17   kp   IMAGE GUIDED SINUS SURGERY Bilateral 04/26/2020   Procedure: IMAGE GUIDED SINUS SURGERY;  Surgeon: Vernie Murders, MD;  Location: [facility];  Service: ENT;   Laterality: Bilateral;  need stryker disk gave Steward Drone disk on 8-23 kp   MENISCUS REPAIR Left 2018   SEPTOPLASTY Bilateral 10/27/2019   Procedure: SEPTOPLASTY REVISION;  Surgeon: Vernie Murders, MD;  Location: [facility];  Service: ENT;  Laterality: Bilateral;   SPHENOIDECTOMY Right 04/26/2020   Procedure: Selina Cooley;  Surgeon: Vernie Murders, MD;  Location: [facility];  Service: ENT;  Laterality: Right;   TONSILLECTOMY  1964    Prior to Admission medications   Medication Sig Start Date End Date Taking? Authorizing Provider  APPLE CIDER VINEGAR PO Take 500 mg by mouth.   Yes [provider]  budesonide (PULMICORT) 0.5 MG/2ML nebulizer solution Inhale into the lungs. 11/14/20 11/14/21 Yes [provider]  Calcium Carbonate-Vitamin D (RA CALCIUM PLUS VITAMIN D PO) Take 525 mg by mouth 2 (two) times daily.   Yes [provider]  Cholecalciferol (VITAMIN D) 50 MCG (2000 UT) CAPS Take 1 capsule by mouth daily.   Yes [provider]  Coenzyme Q10 (COQ-10) 100 MG CAPS Take 1 each by mouth 2 (two) times daily.   Yes [provider]  DULoxetine (CYMBALTA) 60 MG capsule Take 1 capsule by mouth daily. 06/18/21 06/18/22 Yes [provider]  EPINEPHrine 0.3 mg/0.3 mL IJ SOAJ injection  06/13/20  Yes [provider]  fexofenadine (ALLEGRA) 180 MG tablet Take 180 mg by mouth daily.   Yes  [provider]  fluticasone (FLONASE) 50 MCG/ACT nasal spray Place 2 sprays into both nostrils daily. 04/16/20  Yes [provider]  gabapentin (NEURONTIN) 600 MG tablet Take 600 mg by mouth 3 (three) times daily.   Yes [provider]  Glucosamine-Chondroitin (GLUCOSAMINE CHONDR COMPLEX PO) Take 3,700 mg by mouth daily.   Yes [provider]  ibuprofen (ADVIL) 600 MG tablet Take 600 mg by mouth every 6 (six) hours as needed.   Yes [provider]  ipratropium-albuterol (DUONEB) 0.5-2.5 (3) MG/3ML SOLN  Inhale into the lungs. 11/14/20 11/09/21 Yes [provider]  Lasmiditan Succinate (REYVOW) 100 MG TABS Take by mouth.   Yes [provider]  naproxen (NAPROSYN) 500 MG tablet Take 500 mg by mouth 2 (two) times daily with a meal.   Yes [provider]  naratriptan (AMERGE) 2.5 MG tablet  05/24/20  Yes [provider]  omeprazole (PRILOSEC) 20 MG capsule Take 1 capsule (20 mg total) by mouth in the morning and at bedtime for 60 days, THEN 1 capsule (20 mg total) daily. 04/29/21 07/28/21 Yes Pasty Spillers, MD  polycarbophil (FIBERCON) 625 MG tablet Take 1,250 mg by mouth 2 (two) times daily.   Yes [provider]  prochlorperazine (COMPAZINE) 10 MG tablet Take 10 mg by mouth every 6 (six) hours as needed for nausea or vomiting.   Yes [provider]  Riboflavin 400 MG TABS Take 1 tablet by mouth daily.   Yes [provider]  rosuvastatin (CRESTOR) 10 MG tablet TAKE 1 TABLET EVERY DAY  (DUE  FOR  BLOOD  WORK) 07/08/21  Yes Duanne Limerick, MD  traMADol-acetaminophen (ULTRACET) 37.5-325 MG tablet Take 1 tablet by mouth every 6 (six) hours as needed.   Yes [provider]  traZODone (DESYREL) 50 MG tablet Take 1-2 tablets (50-100 mg total) by mouth at bedtime as needed. for sleep 07/17/21  Yes Duanne Limerick, MD  vitamin C (ASCORBIC ACID) 500 MG tablet Take 500 mg by mouth daily.   Yes [provider]  Vitamin E 180 MG CAPS Take 1 capsule by mouth daily.   Yes [provider]  Vitamins-Lipotropics (LIPO-FLAVONOID PLUS PO) Take 1,000 mg by mouth 2 (two) times daily.   Yes [provider]  Zinc 50 MG CAPS Take 1 capsule by mouth daily.   Yes [provider]    Allergies as of 07/08/2021   (No Known Allergies)    Family History  Problem Relation Age of Onset   Cancer Father     Social History   Socioeconomic History   Marital status: Married    Spouse name: Not on file   Number of  children: 1   Years of education: Not on file   Highest education level: Not on file  Occupational History   Not on file  Tobacco Use   Smoking status: Former    Packs/day: 1.00    Years: 26.00    Pack years: 26.00    Types: Cigarettes    Quit date: 09/01/1988    Years since quitting: 32.9   Smokeless tobacco: Never  Vaping Use   Vaping Use: Never used  Substance and Sexual Activity   Alcohol use: Yes    Alcohol/week: 7.0 standard drinks    Types: 7 Cans of beer per week    Comment: Social drinking only   Drug use: Never   Sexual activity: Yes  Other Topics Concern   Not on file  Social History Narrative   Not on file   Social Determinants of Health   Financial Resource Strain: Low Risk    Difficulty of Paying Living Expenses: Not hard at all  Food Insecurity: No Food Insecurity   Worried About Programme researcher, broadcasting/film/video in the Last Year: Never true   Barista in the Last Year: Never true  Transportation Needs: No Transportation Needs   Lack of Transportation (Medical): No   Lack of Transportation (Non-Medical): No  Physical Activity: Inactive   Days of Exercise per Week: 0 days   Minutes of Exercise per Session: 0 min  Stress: No Stress Concern Present   Feeling of Stress : Not at all  Social Connections: Moderately Isolated   Frequency of Communication with Friends and Family: More than three times a week   Frequency of Social Gatherings with Friends and Family: More than three times a week   Attends Religious Services: Never   Database administrator or Organizations: No   Attends Engineer, structural: Never   Marital Status: Married  Catering manager Violence: Not At Risk   Fear of Current or Ex-Partner: No   Emotionally Abused: No   Physically Abused: No   Sexually Abused: No    Review of Systems: See HPI, otherwise negative ROS  Constitutional: General:   Alert,  Well-developed, well-nourished, pleasant and cooperative in NAD BP 124/82    Pulse (!) 59   Temp 98.1 F (36.7 C) (Temporal)   Resp 19   Wt 64.4 kg   SpO2 100%   BMI 24.37 kg/m   Head: Normocephalic, atraumatic.   Eyes:  Sclera clear, no icterus.   Conjunctiva pink.   Mouth:  No deformity or lesions, oropharynx pink & moist.  Neck:  Supple, trachea midline  Respiratory: Normal respiratory effort  Gastrointestinal:  Soft, non-tender and non-distended without masses, hepatosplenomegaly or hernias noted.  No guarding or rebound tenderness.     Cardiac: No clubbing or edema.  No cyanosis. Normal posterior tibial pedal pulses noted.  Lymphatic:  No significant cervical adenopathy.  Psych:  Alert and cooperative. Normal mood and affect.  Musculoskeletal:   Symmetrical without gross deformities. 5/5 Lower extremity strength bilaterally.  Skin: Warm. Intact without significant lesions or rashes. No jaundice.  Neurologic:  Face symmetrical, tongue midline, Normal sensation to touch;  grossly normal neurologically.  Psych:  Alert and oriented x3, Alert and cooperative. Normal mood and affect.  Impression/Plan: Blake Patrick is here for an EGD for dysphagia, esophagitis  Risks, benefits, limitations, and alternatives regarding the procedure have been reviewed with the patient.  Questions have been answered.  All parties agreeable.   Pasty Spillers, MD  07/22/2021, 11:27 AM

## 2021-07-23 ENCOUNTER — Encounter: Payer: Self-pay | Admitting: Gastroenterology

## 2021-07-23 DIAGNOSIS — R262 Difficulty in walking, not elsewhere classified: Secondary | ICD-10-CM | POA: Diagnosis not present

## 2021-07-23 DIAGNOSIS — J301 Allergic rhinitis due to pollen: Secondary | ICD-10-CM | POA: Diagnosis not present

## 2021-07-23 DIAGNOSIS — M25571 Pain in right ankle and joints of right foot: Secondary | ICD-10-CM | POA: Diagnosis not present

## 2021-07-23 DIAGNOSIS — M25674 Stiffness of right foot, not elsewhere classified: Secondary | ICD-10-CM | POA: Diagnosis not present

## 2021-07-23 DIAGNOSIS — M6281 Muscle weakness (generalized): Secondary | ICD-10-CM | POA: Diagnosis not present

## 2021-07-23 LAB — SURGICAL PATHOLOGY

## 2021-07-24 DIAGNOSIS — R262 Difficulty in walking, not elsewhere classified: Secondary | ICD-10-CM | POA: Diagnosis not present

## 2021-07-24 DIAGNOSIS — M25674 Stiffness of right foot, not elsewhere classified: Secondary | ICD-10-CM | POA: Diagnosis not present

## 2021-07-24 DIAGNOSIS — M25571 Pain in right ankle and joints of right foot: Secondary | ICD-10-CM | POA: Diagnosis not present

## 2021-07-24 DIAGNOSIS — M6281 Muscle weakness (generalized): Secondary | ICD-10-CM | POA: Diagnosis not present

## 2021-07-28 ENCOUNTER — Encounter: Payer: Self-pay | Admitting: Family Medicine

## 2021-07-30 DIAGNOSIS — R519 Headache, unspecified: Secondary | ICD-10-CM | POA: Diagnosis not present

## 2021-07-30 DIAGNOSIS — M25674 Stiffness of right foot, not elsewhere classified: Secondary | ICD-10-CM | POA: Diagnosis not present

## 2021-07-30 DIAGNOSIS — M6281 Muscle weakness (generalized): Secondary | ICD-10-CM | POA: Diagnosis not present

## 2021-07-30 DIAGNOSIS — M25571 Pain in right ankle and joints of right foot: Secondary | ICD-10-CM | POA: Diagnosis not present

## 2021-07-30 DIAGNOSIS — M542 Cervicalgia: Secondary | ICD-10-CM | POA: Diagnosis not present

## 2021-07-30 DIAGNOSIS — R262 Difficulty in walking, not elsewhere classified: Secondary | ICD-10-CM | POA: Diagnosis not present

## 2021-07-31 ENCOUNTER — Encounter: Payer: Self-pay | Admitting: Gastroenterology

## 2021-07-31 DIAGNOSIS — R310 Gross hematuria: Secondary | ICD-10-CM | POA: Diagnosis not present

## 2021-07-31 DIAGNOSIS — D494 Neoplasm of unspecified behavior of bladder: Secondary | ICD-10-CM | POA: Diagnosis not present

## 2021-08-02 DIAGNOSIS — M25674 Stiffness of right foot, not elsewhere classified: Secondary | ICD-10-CM | POA: Diagnosis not present

## 2021-08-02 DIAGNOSIS — M542 Cervicalgia: Secondary | ICD-10-CM | POA: Diagnosis not present

## 2021-08-02 DIAGNOSIS — M25571 Pain in right ankle and joints of right foot: Secondary | ICD-10-CM | POA: Diagnosis not present

## 2021-08-02 DIAGNOSIS — M6281 Muscle weakness (generalized): Secondary | ICD-10-CM | POA: Diagnosis not present

## 2021-08-02 DIAGNOSIS — R262 Difficulty in walking, not elsewhere classified: Secondary | ICD-10-CM | POA: Diagnosis not present

## 2021-08-02 DIAGNOSIS — R519 Headache, unspecified: Secondary | ICD-10-CM | POA: Diagnosis not present

## 2021-08-05 DIAGNOSIS — M25571 Pain in right ankle and joints of right foot: Secondary | ICD-10-CM | POA: Diagnosis not present

## 2021-08-05 DIAGNOSIS — M6281 Muscle weakness (generalized): Secondary | ICD-10-CM | POA: Diagnosis not present

## 2021-08-05 DIAGNOSIS — M25674 Stiffness of right foot, not elsewhere classified: Secondary | ICD-10-CM | POA: Diagnosis not present

## 2021-08-05 DIAGNOSIS — R262 Difficulty in walking, not elsewhere classified: Secondary | ICD-10-CM | POA: Diagnosis not present

## 2021-08-05 DIAGNOSIS — M542 Cervicalgia: Secondary | ICD-10-CM | POA: Diagnosis not present

## 2021-08-05 DIAGNOSIS — R519 Headache, unspecified: Secondary | ICD-10-CM | POA: Diagnosis not present

## 2021-08-06 DIAGNOSIS — J301 Allergic rhinitis due to pollen: Secondary | ICD-10-CM | POA: Diagnosis not present

## 2021-08-06 MED ORDER — OMEPRAZOLE 20 MG PO CPDR: 20.0000 mg | DELAYED_RELEASE_CAPSULE | Freq: Every day | ORAL | 0 refills | Status: DC

## 2021-08-07 DIAGNOSIS — M19071 Primary osteoarthritis, right ankle and foot: Secondary | ICD-10-CM | POA: Diagnosis not present

## 2021-08-07 DIAGNOSIS — S92101G Unspecified fracture of right talus, subsequent encounter for fracture with delayed healing: Secondary | ICD-10-CM | POA: Diagnosis not present

## 2021-08-07 DIAGNOSIS — M7989 Other specified soft tissue disorders: Secondary | ICD-10-CM | POA: Diagnosis not present

## 2021-08-07 DIAGNOSIS — M7731 Calcaneal spur, right foot: Secondary | ICD-10-CM | POA: Diagnosis not present

## 2021-08-07 DIAGNOSIS — S93314D Dislocation of tarsal joint of right foot, subsequent encounter: Secondary | ICD-10-CM | POA: Diagnosis not present

## 2021-08-07 DIAGNOSIS — M85871 Other specified disorders of bone density and structure, right ankle and foot: Secondary | ICD-10-CM | POA: Diagnosis not present

## 2021-08-07 DIAGNOSIS — Z87891 Personal history of nicotine dependence: Secondary | ICD-10-CM | POA: Diagnosis not present

## 2021-08-07 DIAGNOSIS — W11XXXD Fall on and from ladder, subsequent encounter: Secondary | ICD-10-CM | POA: Diagnosis not present

## 2021-08-09 DIAGNOSIS — R519 Headache, unspecified: Secondary | ICD-10-CM | POA: Diagnosis not present

## 2021-08-09 DIAGNOSIS — M25674 Stiffness of right foot, not elsewhere classified: Secondary | ICD-10-CM | POA: Diagnosis not present

## 2021-08-09 DIAGNOSIS — R262 Difficulty in walking, not elsewhere classified: Secondary | ICD-10-CM | POA: Diagnosis not present

## 2021-08-09 DIAGNOSIS — M25571 Pain in right ankle and joints of right foot: Secondary | ICD-10-CM | POA: Diagnosis not present

## 2021-08-09 DIAGNOSIS — M542 Cervicalgia: Secondary | ICD-10-CM | POA: Diagnosis not present

## 2021-08-09 DIAGNOSIS — M6281 Muscle weakness (generalized): Secondary | ICD-10-CM | POA: Diagnosis not present

## 2021-08-12 DIAGNOSIS — M6281 Muscle weakness (generalized): Secondary | ICD-10-CM | POA: Diagnosis not present

## 2021-08-12 DIAGNOSIS — M542 Cervicalgia: Secondary | ICD-10-CM | POA: Diagnosis not present

## 2021-08-12 DIAGNOSIS — R262 Difficulty in walking, not elsewhere classified: Secondary | ICD-10-CM | POA: Diagnosis not present

## 2021-08-12 DIAGNOSIS — M25571 Pain in right ankle and joints of right foot: Secondary | ICD-10-CM | POA: Diagnosis not present

## 2021-08-12 DIAGNOSIS — M25674 Stiffness of right foot, not elsewhere classified: Secondary | ICD-10-CM | POA: Diagnosis not present

## 2021-08-12 DIAGNOSIS — R519 Headache, unspecified: Secondary | ICD-10-CM | POA: Diagnosis not present

## 2021-08-15 DIAGNOSIS — M542 Cervicalgia: Secondary | ICD-10-CM | POA: Diagnosis not present

## 2021-08-15 DIAGNOSIS — R519 Headache, unspecified: Secondary | ICD-10-CM | POA: Diagnosis not present

## 2021-08-15 DIAGNOSIS — M6281 Muscle weakness (generalized): Secondary | ICD-10-CM | POA: Diagnosis not present

## 2021-08-15 DIAGNOSIS — M25674 Stiffness of right foot, not elsewhere classified: Secondary | ICD-10-CM | POA: Diagnosis not present

## 2021-08-15 DIAGNOSIS — R262 Difficulty in walking, not elsewhere classified: Secondary | ICD-10-CM | POA: Diagnosis not present

## 2021-08-15 DIAGNOSIS — M25571 Pain in right ankle and joints of right foot: Secondary | ICD-10-CM | POA: Diagnosis not present

## 2021-08-16 DIAGNOSIS — J301 Allergic rhinitis due to pollen: Secondary | ICD-10-CM | POA: Diagnosis not present

## 2021-08-19 DIAGNOSIS — R519 Headache, unspecified: Secondary | ICD-10-CM | POA: Diagnosis not present

## 2021-08-19 DIAGNOSIS — M6281 Muscle weakness (generalized): Secondary | ICD-10-CM | POA: Diagnosis not present

## 2021-08-19 DIAGNOSIS — R262 Difficulty in walking, not elsewhere classified: Secondary | ICD-10-CM | POA: Diagnosis not present

## 2021-08-19 DIAGNOSIS — M25674 Stiffness of right foot, not elsewhere classified: Secondary | ICD-10-CM | POA: Diagnosis not present

## 2021-08-19 DIAGNOSIS — M542 Cervicalgia: Secondary | ICD-10-CM | POA: Diagnosis not present

## 2021-08-19 DIAGNOSIS — M25571 Pain in right ankle and joints of right foot: Secondary | ICD-10-CM | POA: Diagnosis not present

## 2021-08-20 DIAGNOSIS — J301 Allergic rhinitis due to pollen: Secondary | ICD-10-CM | POA: Diagnosis not present

## 2021-08-23 DIAGNOSIS — R519 Headache, unspecified: Secondary | ICD-10-CM | POA: Diagnosis not present

## 2021-08-23 DIAGNOSIS — M6281 Muscle weakness (generalized): Secondary | ICD-10-CM | POA: Diagnosis not present

## 2021-08-23 DIAGNOSIS — M542 Cervicalgia: Secondary | ICD-10-CM | POA: Diagnosis not present

## 2021-08-23 DIAGNOSIS — M25674 Stiffness of right foot, not elsewhere classified: Secondary | ICD-10-CM | POA: Diagnosis not present

## 2021-08-23 DIAGNOSIS — R262 Difficulty in walking, not elsewhere classified: Secondary | ICD-10-CM | POA: Diagnosis not present

## 2021-08-23 DIAGNOSIS — M25571 Pain in right ankle and joints of right foot: Secondary | ICD-10-CM | POA: Diagnosis not present

## 2021-08-27 DIAGNOSIS — J301 Allergic rhinitis due to pollen: Secondary | ICD-10-CM | POA: Diagnosis not present

## 2021-08-28 DIAGNOSIS — Z01818 Encounter for other preprocedural examination: Secondary | ICD-10-CM | POA: Diagnosis not present

## 2021-08-28 DIAGNOSIS — G43709 Chronic migraine without aura, not intractable, without status migrainosus: Secondary | ICD-10-CM | POA: Diagnosis not present

## 2021-08-28 DIAGNOSIS — J45909 Unspecified asthma, uncomplicated: Secondary | ICD-10-CM | POA: Diagnosis not present

## 2021-08-28 DIAGNOSIS — D494 Neoplasm of unspecified behavior of bladder: Secondary | ICD-10-CM | POA: Diagnosis not present

## 2021-08-29 DIAGNOSIS — M25571 Pain in right ankle and joints of right foot: Secondary | ICD-10-CM | POA: Diagnosis not present

## 2021-08-29 DIAGNOSIS — M25674 Stiffness of right foot, not elsewhere classified: Secondary | ICD-10-CM | POA: Diagnosis not present

## 2021-08-29 DIAGNOSIS — R262 Difficulty in walking, not elsewhere classified: Secondary | ICD-10-CM | POA: Diagnosis not present

## 2021-08-29 DIAGNOSIS — R519 Headache, unspecified: Secondary | ICD-10-CM | POA: Diagnosis not present

## 2021-08-29 DIAGNOSIS — M6281 Muscle weakness (generalized): Secondary | ICD-10-CM | POA: Diagnosis not present

## 2021-08-29 DIAGNOSIS — M542 Cervicalgia: Secondary | ICD-10-CM | POA: Diagnosis not present

## 2021-09-03 ENCOUNTER — Other Ambulatory Visit: Payer: Self-pay

## 2021-09-03 DIAGNOSIS — N400 Enlarged prostate without lower urinary tract symptoms: Secondary | ICD-10-CM | POA: Diagnosis not present

## 2021-09-03 DIAGNOSIS — Z7951 Long term (current) use of inhaled steroids: Secondary | ICD-10-CM | POA: Diagnosis not present

## 2021-09-03 DIAGNOSIS — J4540 Moderate persistent asthma, uncomplicated: Secondary | ICD-10-CM | POA: Diagnosis not present

## 2021-09-03 DIAGNOSIS — J301 Allergic rhinitis due to pollen: Secondary | ICD-10-CM | POA: Diagnosis not present

## 2021-09-03 DIAGNOSIS — Z87891 Personal history of nicotine dependence: Secondary | ICD-10-CM | POA: Diagnosis not present

## 2021-09-03 DIAGNOSIS — R748 Abnormal levels of other serum enzymes: Secondary | ICD-10-CM | POA: Diagnosis not present

## 2021-09-03 DIAGNOSIS — D494 Neoplasm of unspecified behavior of bladder: Secondary | ICD-10-CM | POA: Diagnosis not present

## 2021-09-03 DIAGNOSIS — G43709 Chronic migraine without aura, not intractable, without status migrainosus: Secondary | ICD-10-CM | POA: Diagnosis not present

## 2021-09-03 DIAGNOSIS — R310 Gross hematuria: Secondary | ICD-10-CM | POA: Diagnosis not present

## 2021-09-03 DIAGNOSIS — Z9889 Other specified postprocedural states: Secondary | ICD-10-CM | POA: Diagnosis not present

## 2021-09-03 DIAGNOSIS — C672 Malignant neoplasm of lateral wall of bladder: Secondary | ICD-10-CM | POA: Diagnosis not present

## 2021-09-03 DIAGNOSIS — Z79899 Other long term (current) drug therapy: Secondary | ICD-10-CM | POA: Diagnosis not present

## 2021-09-03 DIAGNOSIS — Z77090 Contact with and (suspected) exposure to asbestos: Secondary | ICD-10-CM | POA: Diagnosis not present

## 2021-09-04 DIAGNOSIS — R519 Headache, unspecified: Secondary | ICD-10-CM | POA: Diagnosis not present

## 2021-09-04 DIAGNOSIS — R262 Difficulty in walking, not elsewhere classified: Secondary | ICD-10-CM | POA: Diagnosis not present

## 2021-09-04 DIAGNOSIS — M542 Cervicalgia: Secondary | ICD-10-CM | POA: Diagnosis not present

## 2021-09-04 DIAGNOSIS — M25674 Stiffness of right foot, not elsewhere classified: Secondary | ICD-10-CM | POA: Diagnosis not present

## 2021-09-04 DIAGNOSIS — M25571 Pain in right ankle and joints of right foot: Secondary | ICD-10-CM | POA: Diagnosis not present

## 2021-09-04 DIAGNOSIS — M6281 Muscle weakness (generalized): Secondary | ICD-10-CM | POA: Diagnosis not present

## 2021-09-04 LAB — ALKALINE PHOSPHATASE: Alkaline Phosphatase: 110 IU/L (ref 44–121)

## 2021-09-06 DIAGNOSIS — M542 Cervicalgia: Secondary | ICD-10-CM | POA: Diagnosis not present

## 2021-09-06 DIAGNOSIS — M25674 Stiffness of right foot, not elsewhere classified: Secondary | ICD-10-CM | POA: Diagnosis not present

## 2021-09-06 DIAGNOSIS — R519 Headache, unspecified: Secondary | ICD-10-CM | POA: Diagnosis not present

## 2021-09-06 DIAGNOSIS — M25571 Pain in right ankle and joints of right foot: Secondary | ICD-10-CM | POA: Diagnosis not present

## 2021-09-06 DIAGNOSIS — R262 Difficulty in walking, not elsewhere classified: Secondary | ICD-10-CM | POA: Diagnosis not present

## 2021-09-06 DIAGNOSIS — M6281 Muscle weakness (generalized): Secondary | ICD-10-CM | POA: Diagnosis not present

## 2021-09-09 DIAGNOSIS — R519 Headache, unspecified: Secondary | ICD-10-CM | POA: Diagnosis not present

## 2021-09-09 DIAGNOSIS — M6281 Muscle weakness (generalized): Secondary | ICD-10-CM | POA: Diagnosis not present

## 2021-09-09 DIAGNOSIS — M25674 Stiffness of right foot, not elsewhere classified: Secondary | ICD-10-CM | POA: Diagnosis not present

## 2021-09-09 DIAGNOSIS — M542 Cervicalgia: Secondary | ICD-10-CM | POA: Diagnosis not present

## 2021-09-09 DIAGNOSIS — M25571 Pain in right ankle and joints of right foot: Secondary | ICD-10-CM | POA: Diagnosis not present

## 2021-09-09 DIAGNOSIS — R262 Difficulty in walking, not elsewhere classified: Secondary | ICD-10-CM | POA: Diagnosis not present

## 2021-09-10 DIAGNOSIS — J301 Allergic rhinitis due to pollen: Secondary | ICD-10-CM | POA: Diagnosis not present

## 2021-09-11 ENCOUNTER — Other Ambulatory Visit: Payer: Self-pay | Admitting: Family Medicine

## 2021-09-11 NOTE — Telephone Encounter (Signed)
Requested Prescriptions  Pending Prescriptions Disp Refills   traZODone (DESYREL) 50 MG tablet [Pharmacy Med Name: TRAZODONE 50 MG TABLET] 180 tablet 0    Sig: TAKE 1 TO 2 TABLETS BY MOUTH AT BEDTIME AS NEEDED FOR SLEEP     Psychiatry: Antidepressants - Serotonin Modulator Passed - 09/11/2021  1:30 PM      Passed - Valid encounter within last 6 months    Recent Outpatient Visits          2 months ago Hyperlipidemia, unspecified hyperlipidemia type   [place], [place]   2 months ago Gross hematuria   [facility] Glean Hess, MD   3 months ago Primary insomnia   [facility] Juline Patch, MD   8 months ago Hyperlipidemia, unspecified hyperlipidemia type   [facility] Juline Patch, MD   1 year ago Tenosynovitis of left foot   [facility] Juline Patch, MD      Future Appointments            In 3 months Juline Patch, MD [facility], [facility]

## 2021-09-12 ENCOUNTER — Encounter: Payer: Self-pay | Admitting: Family Medicine

## 2021-09-12 ENCOUNTER — Other Ambulatory Visit: Payer: Self-pay

## 2021-09-12 ENCOUNTER — Ambulatory Visit
Admission: RE | Admit: 2021-09-12 | Discharge: 2021-09-12 | Disposition: A | Discharge status: Home / Self Care | Payer: Medicare PPO | Source: Ambulatory Visit | Attending: Family Medicine | Admitting: Family Medicine

## 2021-09-12 ENCOUNTER — Ambulatory Visit (INDEPENDENT_AMBULATORY_CARE_PROVIDER_SITE_OTHER): Payer: Medicare PPO | Admitting: Family Medicine

## 2021-09-12 ENCOUNTER — Ambulatory Visit
Admission: RE | Admit: 2021-09-12 | Discharge: 2021-09-12 | Disposition: A | Discharge status: Home / Self Care | Payer: Medicare PPO | Attending: Family Medicine | Admitting: Family Medicine

## 2021-09-12 VITALS — BP 130/90 | HR 83 | Ht 64.0 in | Wt 152.0 lb

## 2021-09-12 DIAGNOSIS — R109 Unspecified abdominal pain: Secondary | ICD-10-CM | POA: Diagnosis not present

## 2021-09-12 DIAGNOSIS — M545 Low back pain, unspecified: Secondary | ICD-10-CM

## 2021-09-12 LAB — POCT URINALYSIS DIPSTICK
Bilirubin, UA: NEGATIVE
Blood, UA: NEGATIVE
Glucose, UA: POSITIVE — AB
Ketones, UA: NEGATIVE
Leukocytes, UA: NEGATIVE
Nitrite, UA: NEGATIVE
Protein, UA: NEGATIVE
Spec Grav, UA: 1.015 (ref 1.010–1.025)
Urobilinogen, UA: 0.2 E.U./dL
pH, UA: 6 (ref 5.0–8.0)

## 2021-09-12 MED ORDER — MELOXICAM 15 MG PO TABS: 15.0000 mg | ORAL_TABLET | Freq: Every day | ORAL | 0 refills | Status: DC

## 2021-09-12 MED ORDER — CYCLOBENZAPRINE HCL 10 MG PO TABS: 10.0000 mg | ORAL_TABLET | Freq: Every day | ORAL | 0 refills | Status: DC

## 2021-09-12 NOTE — Progress Notes (Signed)
Date:  09/12/2021   Name:  Blake Patrick   DOB:  07/30/1951   MRN:  [id number]   Chief Complaint: Back Pain (Had surgery to remove tumors, then had to go to PT- back pain started afterwards. Was using Crista Elliot and [facility], but didn't help)  Back Pain This is a new problem. The current episode started in the past 7 days. The problem occurs constantly. The problem is unchanged. Pain location: left flank. The quality of the pain is described as aching. The pain does not radiate. The pain is at a severity of 3/10. The pain is mild. The symptoms are aggravated by bending and twisting (lifting). Pertinent negatives include no abdominal pain, bladder incontinence, bowel incontinence, chest pain, dysuria, fever, headaches, numbness, paresthesias, weakness or weight loss. Risk factors include history of cancer (low grade bladder). He has tried NSAIDs (ben gay) for the symptoms.  Flank Pain This is a new problem. The current episode started in the past 7 days. The quality of the pain is described as aching. The pain is at a severity of 3/10. The pain is mild. Pertinent negatives include no abdominal pain, bladder incontinence, bowel incontinence, chest pain, dysuria, fever, headaches, numbness, paresthesias, weakness or weight loss.   Lab Results  Component Value Date   NA 135 04/02/2021   K 4.1 04/02/2021   CO2 22 04/02/2021   GLUCOSE 92 04/02/2021   BUN 14 04/02/2021   CREATININE 1.00 04/02/2021   CALCIUM 9.3 04/02/2021   GFRNONAA >60 04/02/2021   Lab Results  Component Value Date   CHOL 152 07/08/2021   HDL 81 07/08/2021   LDLCALC 59 07/08/2021   TRIG 57 07/08/2021   Lab Results  Component Value Date   TSH 0.69 07/08/2018   Lab Results  Component Value Date   HGBA1C 5.4 04/06/2019   Lab Results  Component Value Date   WBC 5.6 04/02/2021   HGB 14.4 04/02/2021   HCT 40.8 04/02/2021   MCV 93.6 04/02/2021   PLT 238 04/02/2021   Lab Results  Component Value Date   ALT 19  07/08/2021   AST 17 07/08/2021   ALKPHOS 110 09/03/2021   BILITOT 0.3 07/08/2021   No results found for: 25OHVITD2, 25OHVITD3, VD25OH   Review of Systems  Constitutional:  Negative for chills, fever and weight loss.  HENT:  Negative for drooling, ear discharge, ear pain and sore throat.   Respiratory:  Negative for cough, shortness of breath and wheezing.   Cardiovascular:  Negative for chest pain, palpitations and leg swelling.  Gastrointestinal:  Negative for abdominal pain, blood in stool, bowel incontinence, constipation, diarrhea and nausea.  Endocrine: Negative for polydipsia.  Genitourinary:  Positive for flank pain. Negative for bladder incontinence, dysuria, frequency, hematuria and urgency.  Musculoskeletal:  Positive for back pain and myalgias. Negative for neck pain.  Skin:  Negative for rash.  Allergic/Immunologic: Negative for environmental allergies.  Neurological:  Negative for dizziness, weakness, numbness, headaches and paresthesias.  Hematological:  Does not bruise/bleed easily.  Psychiatric/Behavioral:  Negative for suicidal ideas. The patient is not nervous/anxious.    Patient Active Problem List   Diagnosis Date Noted   Acute esophagitis    Columnar-lined esophagus    Hiatal hernia    Hyperlipidemia, mixed 07/01/2021   Insomnia disorder 07/01/2021   Environmental and seasonal allergies 07/01/2021   Esophageal dysphagia    Duodenal erythema    Esophagitis    Esophageal stenosis    Severe persistent asthma without  complication 123XX123   Closed dislocation of tarsal joint of right foot 02/09/2021   Migraines 12/21/2017    No Known Allergies  Past Surgical History:  Procedure Laterality Date   BIOPSY N/A 04/29/2021   Procedure: BIOPSY;  Surgeon: Virgel Manifold, MD;  Location: [place];  Service: Endoscopy;  Laterality: N/A;   ELBOW SURGERY Right 2007   ESOPHAGOGASTRODUODENOSCOPY (EGD) WITH PROPOFOL N/A 04/29/2021   Procedure:  ESOPHAGOGASTRODUODENOSCOPY (EGD) WITH PROPOFOL;  Surgeon: Virgel Manifold, MD;  Location: [place];  Service: Endoscopy;  Laterality: N/A;   ESOPHAGOGASTRODUODENOSCOPY (EGD) WITH PROPOFOL N/A 07/22/2021   Procedure: ESOPHAGOGASTRODUODENOSCOPY (EGD) WITH PROPOFOL;  Surgeon: Virgel Manifold, MD;  Location: [place];  Service: Endoscopy;  Laterality: N/A;   ETHMOIDECTOMY Bilateral 10/27/2019   Procedure: TOTAL ETHMOIDECTOMY WITH FRONTAL SINUOTOMY;  Surgeon: Margaretha Sheffield, MD;  Location: [place];  Service: ENT;  Laterality: Bilateral;   EYE SURGERY  2019   Replaced   HAND [place] SINUS SURGERY Bilateral 10/27/2019   Procedure: IMAGE GUIDED SINUS SURGERY;  Surgeon: Margaretha Sheffield, MD;  Location: [place];  Service: ENT;  Laterality: Bilateral;  need stryker disk placed disk on or charge nurse desk 2-17   [place] Bilateral 04/26/2020   Procedure: IMAGE [place];  Surgeon: Margaretha Sheffield, MD;  Location: [place];  Service: ENT;  Laterality: Bilateral;  need stryker disk gave Hassan Rowan disk on 8-23 kp   MENISCUS REPAIR Left 2018   SEPTOPLASTY Bilateral 10/27/2019   Procedure: SEPTOPLASTY REVISION;  Surgeon: Margaretha Sheffield, MD;  Location: [place];  Service: ENT;  Laterality: Bilateral;   SPHENOIDECTOMY Right 04/26/2020   Procedure: Coralee P[place];  Surgeon: Margaretha Sheffield, MD;  Location: [place];  Service: ENT;  Laterality: Right;   TONSILLECTOMY  1964    Social History   Tobacco Use   Smoking status: Former    Packs/day: 1.00    Years: 26.00    Pack years: 26.00    Types: Cigarettes    Quit date: 09/01/1988    Years since quitting: 33.0   Smokeless tobacco: Never  Vaping Use   Vaping Use: Never used  Substance Use Topics   Alcohol use: Yes    Alcohol/week: 7.0 standard drinks    Types: 7 Cans of beer per week    Comment: Social  drinking only   Drug use: Never     Medication list has been reviewed and updated.  Current Meds  Medication Sig   APPLE CIDER VINEGAR PO Take 500 mg by mouth.   budesonide (PULMICORT) 0.5 MG/2ML nebulizer solution Inhale into the lungs.   Calcium Carbonate-Vitamin D (RA CALCIUM PLUS VITAMIN D PO) Take 525 mg by mouth 2 (two) times daily.   Cholecalciferol (VITAMIN D) 50 MCG (2000 UT) CAPS Take 1 capsule by mouth daily.   Coenzyme Q10 (COQ-10) 100 MG CAPS Take 1 each by mouth 2 (two) times daily.   DULoxetine (CYMBALTA) 60 MG capsule Take 1 capsule by mouth daily.   EPINEPHrine 0.3 mg/0.3 mL IJ SOAJ injection    fexofenadine (ALLEGRA) 180 MG tablet Take 180 mg by mouth daily.   fluticasone (FLONASE) 50 MCG/ACT nasal spray Place 2 sprays into both nostrils daily.   Glucosamine-Chondroitin (GLUCOSAMINE CHONDR COMPLEX PO) Take 3,700 mg by mouth daily.   ibuprofen (ADVIL) 600 MG tablet Take 600 mg by mouth every 6 (six)  hours as needed.   ipratropium-albuterol (DUONEB) 0.5-2.5 (3) MG/3ML SOLN Inhale into the lungs.   Lasmiditan Succinate (REYVOW) 100 MG TABS Take by mouth.   naratriptan (AMERGE) 2.5 MG tablet    omeprazole (PRILOSEC) 20 MG capsule Take 1 capsule (20 mg total) by mouth daily.   polycarbophil (FIBERCON) 625 MG tablet Take 1,250 mg by mouth 2 (two) times daily.   prochlorperazine (COMPAZINE) 10 MG tablet Take 10 mg by mouth every 6 (six) hours as needed for nausea or vomiting.   Riboflavin 400 MG TABS Take 1 tablet by mouth daily.   rosuvastatin (CRESTOR) 10 MG tablet TAKE 1 TABLET EVERY DAY  (DUE  FOR  BLOOD  WORK)   traZODone (DESYREL) 50 MG tablet TAKE 1 TO 2 TABLETS BY MOUTH AT BEDTIME AS NEEDED FOR SLEEP   vitamin C (ASCORBIC ACID) 500 MG tablet Take 500 mg by mouth daily.   Vitamin E 180 MG CAPS Take 1 capsule by mouth daily.   Vitamins-Lipotropics (LIPO-FLAVONOID PLUS PO) Take 1,000 mg by mouth 2 (two) times daily.   Zinc 50 MG CAPS Take 1 capsule by mouth daily.     PHQ 2/9 Scores 07/08/2021 07/01/2021 06/24/2021 05/22/2021  PHQ - 2 Score 0 0 0 0  PHQ- 9 Score 0 0 1 5    GAD 7 : Generalized Anxiety Score 07/08/2021 07/01/2021 05/22/2021 01/14/2021  Nervous, Anxious, on Edge 0 0 0 0  Control/stop worrying 0 0 0 0  Worry too much - different things 0 0 0 0  Trouble relaxing 0 0 0 0  Restless 0 0 0 0  Easily annoyed or irritable 0 0 0 0  Afraid - awful might happen 0 0 0 0  Total GAD 7 Score 0 0 0 0  Anxiety Difficulty - Not difficult at all - -    BP Readings from Last 3 Encounters:  09/12/21 130/90  07/22/21 102/70  07/08/21 120/78    Physical Exam Vitals and nursing note reviewed.  HENT:     Head: Normocephalic.     Right Ear: Tympanic membrane and external ear normal.     Left Ear: Tympanic membrane and external ear normal.     Nose: Nose normal.  Eyes:     General: No scleral icterus.       Right eye: No discharge.        Left eye: No discharge.     Conjunctiva/sclera: Conjunctivae normal.     Pupils: Pupils are equal, round, and reactive to light.  Neck:     Thyroid: No thyromegaly.     Vascular: No JVD.     Trachea: No tracheal deviation.  Cardiovascular:     Rate and Rhythm: Normal rate and regular rhythm.     Heart sounds: Normal heart sounds. No murmur heard.   No friction rub. No gallop.  Pulmonary:     Effort: No respiratory distress.     Breath sounds: Normal breath sounds. No wheezing or rales.  Abdominal:     General: Bowel sounds are normal.     Palpations: Abdomen is soft. There is no mass.     Tenderness: There is no abdominal tenderness. There is no right CVA tenderness, left CVA tenderness, guarding or rebound.  Musculoskeletal:        General: Normal range of motion.     Cervical back: Normal range of motion and neck supple.     Thoracic back: Tenderness present.     Lumbar back: Spasms present.  Back:  Lymphadenopathy:     Cervical: No cervical adenopathy.  Skin:    General: Skin is warm.      Findings: No rash.  Neurological:     Mental Status: He is alert and oriented to person, place, and time.     Cranial Nerves: No cranial nerve deficit.     Deep Tendon Reflexes: Reflexes are normal and symmetric.    Wt Readings from Last 3 Encounters:  09/12/21 152 lb (68.9 kg)  07/22/21 142 lb (64.4 kg)  07/08/21 139 lb (63 kg)    BP 130/90    Pulse 83    Ht 5\' 4"  (1.626 m)    Wt 152 lb (68.9 kg)    SpO2 96%    BMI 26.09 kg/m   Assessment and Plan:  1. Acute left-sided low back pain without sciatica New onset.  Persistent.  Patient has more thoracolumbar discomfort but has had recent surgery with positioning and associated therapy from that.  Currently is not taking any medications and we will obtain a lumbar sacral spine x-ray given his recent history of low-grade urothelial carcinoma of the bladder.  We will treat with meloxicam 15 mg once a day and cyclobenzaprine at night. - meloxicam (MOBIC) 15 MG tablet; Take 1 tablet (15 mg total) by mouth daily.  Dispense: 30 tablet; Refill: 0 - DG Lumbar Spine Complete; Future - cyclobenzaprine (FLEXERIL) 10 MG tablet; Take 1 tablet (10 mg total) by mouth at bedtime.  Dispense: 30 tablet; Refill: 0  2. Flank pain New onset.  Persistent.  Stable.  There is some tenderness over the left oblique.  Somewhat of a Carnett's sign when he moves.  This is likely secondary to a strain on the oblique muscle with his recent surgeries and positioning and rehabilitation.  We will treat with NSAIDs 15 mg meloxicam once a day and patient is to call if this is persistent. - meloxicam (MOBIC) 15 MG tablet; Take 1 tablet (15 mg total) by mouth daily.  Dispense: 30 tablet; Refill: 0 - DG Lumbar Spine Complete; Future - cyclobenzaprine (FLEXERIL) 10 MG tablet; Take 1 tablet (10 mg total) by mouth at bedtime.  Dispense: 30 tablet; Refill: 0

## 2021-09-12 NOTE — Addendum Note (Signed)
Addended by: Everitt Amber on: 09/12/2021 09:57 AM   Modules accepted: Orders

## 2021-09-13 ENCOUNTER — Encounter: Payer: Self-pay | Admitting: Family Medicine

## 2021-09-13 DIAGNOSIS — M6281 Muscle weakness (generalized): Secondary | ICD-10-CM | POA: Diagnosis not present

## 2021-09-13 DIAGNOSIS — R262 Difficulty in walking, not elsewhere classified: Secondary | ICD-10-CM | POA: Diagnosis not present

## 2021-09-13 DIAGNOSIS — M25674 Stiffness of right foot, not elsewhere classified: Secondary | ICD-10-CM | POA: Diagnosis not present

## 2021-09-13 DIAGNOSIS — M25571 Pain in right ankle and joints of right foot: Secondary | ICD-10-CM | POA: Diagnosis not present

## 2021-09-13 DIAGNOSIS — M542 Cervicalgia: Secondary | ICD-10-CM | POA: Diagnosis not present

## 2021-09-13 DIAGNOSIS — R519 Headache, unspecified: Secondary | ICD-10-CM | POA: Diagnosis not present

## 2021-09-16 DIAGNOSIS — M25571 Pain in right ankle and joints of right foot: Secondary | ICD-10-CM | POA: Diagnosis not present

## 2021-09-16 DIAGNOSIS — M25674 Stiffness of right foot, not elsewhere classified: Secondary | ICD-10-CM | POA: Diagnosis not present

## 2021-09-16 DIAGNOSIS — D0461 Carcinoma in situ of skin of right upper limb, including shoulder: Secondary | ICD-10-CM | POA: Diagnosis not present

## 2021-09-16 DIAGNOSIS — R519 Headache, unspecified: Secondary | ICD-10-CM | POA: Diagnosis not present

## 2021-09-16 DIAGNOSIS — M6281 Muscle weakness (generalized): Secondary | ICD-10-CM | POA: Diagnosis not present

## 2021-09-16 DIAGNOSIS — R262 Difficulty in walking, not elsewhere classified: Secondary | ICD-10-CM | POA: Diagnosis not present

## 2021-09-16 DIAGNOSIS — D485 Neoplasm of uncertain behavior of skin: Secondary | ICD-10-CM | POA: Diagnosis not present

## 2021-09-16 DIAGNOSIS — M542 Cervicalgia: Secondary | ICD-10-CM | POA: Diagnosis not present

## 2021-09-17 DIAGNOSIS — J301 Allergic rhinitis due to pollen: Secondary | ICD-10-CM | POA: Diagnosis not present

## 2021-09-19 DIAGNOSIS — R519 Headache, unspecified: Secondary | ICD-10-CM | POA: Diagnosis not present

## 2021-09-19 DIAGNOSIS — M542 Cervicalgia: Secondary | ICD-10-CM | POA: Diagnosis not present

## 2021-09-19 DIAGNOSIS — M25571 Pain in right ankle and joints of right foot: Secondary | ICD-10-CM | POA: Diagnosis not present

## 2021-09-19 DIAGNOSIS — M25674 Stiffness of right foot, not elsewhere classified: Secondary | ICD-10-CM | POA: Diagnosis not present

## 2021-09-19 DIAGNOSIS — M6281 Muscle weakness (generalized): Secondary | ICD-10-CM | POA: Diagnosis not present

## 2021-09-19 DIAGNOSIS — R262 Difficulty in walking, not elsewhere classified: Secondary | ICD-10-CM | POA: Diagnosis not present

## 2021-09-23 DIAGNOSIS — M6281 Muscle weakness (generalized): Secondary | ICD-10-CM | POA: Diagnosis not present

## 2021-09-23 DIAGNOSIS — M25571 Pain in right ankle and joints of right foot: Secondary | ICD-10-CM | POA: Diagnosis not present

## 2021-09-23 DIAGNOSIS — M25674 Stiffness of right foot, not elsewhere classified: Secondary | ICD-10-CM | POA: Diagnosis not present

## 2021-09-23 DIAGNOSIS — R262 Difficulty in walking, not elsewhere classified: Secondary | ICD-10-CM | POA: Diagnosis not present

## 2021-09-24 DIAGNOSIS — J301 Allergic rhinitis due to pollen: Secondary | ICD-10-CM | POA: Diagnosis not present

## 2021-09-26 DIAGNOSIS — M25674 Stiffness of right foot, not elsewhere classified: Secondary | ICD-10-CM | POA: Diagnosis not present

## 2021-09-26 DIAGNOSIS — R262 Difficulty in walking, not elsewhere classified: Secondary | ICD-10-CM | POA: Diagnosis not present

## 2021-09-26 DIAGNOSIS — M6281 Muscle weakness (generalized): Secondary | ICD-10-CM | POA: Diagnosis not present

## 2021-09-26 DIAGNOSIS — M25571 Pain in right ankle and joints of right foot: Secondary | ICD-10-CM | POA: Diagnosis not present

## 2021-09-29 ENCOUNTER — Other Ambulatory Visit: Payer: Self-pay | Admitting: Family Medicine

## 2021-09-29 NOTE — Telephone Encounter (Signed)
Need to confirm pharmacy- Rx 09/11/21 sent to local pharmacy- this request from mail order

## 2021-09-30 DIAGNOSIS — M6281 Muscle weakness (generalized): Secondary | ICD-10-CM | POA: Diagnosis not present

## 2021-09-30 DIAGNOSIS — R262 Difficulty in walking, not elsewhere classified: Secondary | ICD-10-CM | POA: Diagnosis not present

## 2021-09-30 DIAGNOSIS — M25571 Pain in right ankle and joints of right foot: Secondary | ICD-10-CM | POA: Diagnosis not present

## 2021-09-30 DIAGNOSIS — M25674 Stiffness of right foot, not elsewhere classified: Secondary | ICD-10-CM | POA: Diagnosis not present

## 2021-10-01 DIAGNOSIS — J301 Allergic rhinitis due to pollen: Secondary | ICD-10-CM | POA: Diagnosis not present

## 2021-10-04 DIAGNOSIS — M25571 Pain in right ankle and joints of right foot: Secondary | ICD-10-CM | POA: Diagnosis not present

## 2021-10-04 DIAGNOSIS — R262 Difficulty in walking, not elsewhere classified: Secondary | ICD-10-CM | POA: Diagnosis not present

## 2021-10-04 DIAGNOSIS — M25674 Stiffness of right foot, not elsewhere classified: Secondary | ICD-10-CM | POA: Diagnosis not present

## 2021-10-04 DIAGNOSIS — M6281 Muscle weakness (generalized): Secondary | ICD-10-CM | POA: Diagnosis not present

## 2021-10-08 DIAGNOSIS — J301 Allergic rhinitis due to pollen: Secondary | ICD-10-CM | POA: Diagnosis not present

## 2021-10-09 DIAGNOSIS — M25674 Stiffness of right foot, not elsewhere classified: Secondary | ICD-10-CM | POA: Diagnosis not present

## 2021-10-09 DIAGNOSIS — M6281 Muscle weakness (generalized): Secondary | ICD-10-CM | POA: Diagnosis not present

## 2021-10-09 DIAGNOSIS — R262 Difficulty in walking, not elsewhere classified: Secondary | ICD-10-CM | POA: Diagnosis not present

## 2021-10-09 DIAGNOSIS — M25571 Pain in right ankle and joints of right foot: Secondary | ICD-10-CM | POA: Diagnosis not present

## 2021-10-11 DIAGNOSIS — R262 Difficulty in walking, not elsewhere classified: Secondary | ICD-10-CM | POA: Diagnosis not present

## 2021-10-11 DIAGNOSIS — M6281 Muscle weakness (generalized): Secondary | ICD-10-CM | POA: Diagnosis not present

## 2021-10-11 DIAGNOSIS — M25674 Stiffness of right foot, not elsewhere classified: Secondary | ICD-10-CM | POA: Diagnosis not present

## 2021-10-11 DIAGNOSIS — M25571 Pain in right ankle and joints of right foot: Secondary | ICD-10-CM | POA: Diagnosis not present

## 2021-10-14 DIAGNOSIS — M25674 Stiffness of right foot, not elsewhere classified: Secondary | ICD-10-CM | POA: Diagnosis not present

## 2021-10-14 DIAGNOSIS — R262 Difficulty in walking, not elsewhere classified: Secondary | ICD-10-CM | POA: Diagnosis not present

## 2021-10-14 DIAGNOSIS — M6281 Muscle weakness (generalized): Secondary | ICD-10-CM | POA: Diagnosis not present

## 2021-10-14 DIAGNOSIS — M25571 Pain in right ankle and joints of right foot: Secondary | ICD-10-CM | POA: Diagnosis not present

## 2021-10-15 DIAGNOSIS — D0461 Carcinoma in situ of skin of right upper limb, including shoulder: Secondary | ICD-10-CM | POA: Diagnosis not present

## 2021-10-15 DIAGNOSIS — J301 Allergic rhinitis due to pollen: Secondary | ICD-10-CM | POA: Diagnosis not present

## 2021-10-15 DIAGNOSIS — C44622 Squamous cell carcinoma of skin of right upper limb, including shoulder: Secondary | ICD-10-CM | POA: Diagnosis not present

## 2021-10-16 DIAGNOSIS — M542 Cervicalgia: Secondary | ICD-10-CM | POA: Diagnosis not present

## 2021-10-17 ENCOUNTER — Other Ambulatory Visit: Payer: Self-pay | Admitting: Family Medicine

## 2021-10-17 DIAGNOSIS — R109 Unspecified abdominal pain: Secondary | ICD-10-CM

## 2021-10-17 DIAGNOSIS — M25571 Pain in right ankle and joints of right foot: Secondary | ICD-10-CM | POA: Diagnosis not present

## 2021-10-17 DIAGNOSIS — M25674 Stiffness of right foot, not elsewhere classified: Secondary | ICD-10-CM | POA: Diagnosis not present

## 2021-10-17 DIAGNOSIS — R262 Difficulty in walking, not elsewhere classified: Secondary | ICD-10-CM | POA: Diagnosis not present

## 2021-10-17 DIAGNOSIS — M6281 Muscle weakness (generalized): Secondary | ICD-10-CM | POA: Diagnosis not present

## 2021-10-17 DIAGNOSIS — M545 Low back pain, unspecified: Secondary | ICD-10-CM

## 2021-10-17 NOTE — Telephone Encounter (Signed)
Copied from [place] [phone]. Topic: Quick Communication - Rx Refill/Question >> Oct 17, 2021  4:48 PM Yvette Rack wrote: Medication: cyclobenzaprine (FLEXERIL) 10 MG tablet  Has the patient contacted their pharmacy? Yes.   (Agent: If no, request that the patient contact the pharmacy for the refill. If patient does not wish to contact the pharmacy document the reason why and proceed with request.) (Agent: If yes, when and what did the pharmacy advise?)  Preferred Pharmacy (with phone number or street name): [place], [place]  Phone: [phone] Fax: [phone]  Has the patient been seen for an appointment in the last year OR does the patient have an upcoming appointment? Yes.    Agent: Please be advised that RX refills may take up to 3 business days. We ask that you follow-up with your pharmacy.

## 2021-10-18 DIAGNOSIS — Z23 Encounter for immunization: Secondary | ICD-10-CM | POA: Diagnosis not present

## 2021-10-18 MED ORDER — CYCLOBENZAPRINE HCL 10 MG PO TABS: 10.0000 mg | ORAL_TABLET | Freq: Every day | ORAL | 0 refills | Status: DC

## 2021-10-18 NOTE — Telephone Encounter (Signed)
Requested medication (s) are due for refill today: yes, unsure if to be continued, ordered for acute need.  Requested medication (s) are on the active medication list: yes  Last refill:  09/12/20 #30 with 0 RF  Future visit scheduled: 01/06/22  Notes to clinic:  This medication can not be delegated, please assess.        Requested Prescriptions  Pending Prescriptions Disp Refills   cyclobenzaprine (FLEXERIL) 10 MG tablet 30 tablet 0    Sig: Take 1 tablet (10 mg total) by mouth at bedtime.     Not Delegated - Analgesics:  Muscle Relaxants Failed - 10/17/2021  5:40 PM      Failed - This refill cannot be delegated      Passed - Valid encounter within last 6 months    Recent Outpatient Visits           1 month ago Acute left-sided low back pain without sciatica   [facility] Juline Patch, MD   3 months ago Hyperlipidemia, unspecified hyperlipidemia type   [facility] Juline Patch, MD   3 months ago Gross hematuria   [facility] Glean Hess, MD   4 months ago Primary insomnia   [facility] Juline Patch, MD   9 months ago Hyperlipidemia, unspecified hyperlipidemia type   [facility] Juline Patch, MD       Future Appointments             In 2 months Juline Patch, MD [facility], [facility]

## 2021-10-21 DIAGNOSIS — M25674 Stiffness of right foot, not elsewhere classified: Secondary | ICD-10-CM | POA: Diagnosis not present

## 2021-10-21 DIAGNOSIS — R262 Difficulty in walking, not elsewhere classified: Secondary | ICD-10-CM | POA: Diagnosis not present

## 2021-10-21 DIAGNOSIS — M6281 Muscle weakness (generalized): Secondary | ICD-10-CM | POA: Diagnosis not present

## 2021-10-21 DIAGNOSIS — M25571 Pain in right ankle and joints of right foot: Secondary | ICD-10-CM | POA: Diagnosis not present

## 2021-10-22 DIAGNOSIS — J301 Allergic rhinitis due to pollen: Secondary | ICD-10-CM | POA: Diagnosis not present

## 2021-10-23 DIAGNOSIS — M25674 Stiffness of right foot, not elsewhere classified: Secondary | ICD-10-CM | POA: Diagnosis not present

## 2021-10-23 DIAGNOSIS — M25571 Pain in right ankle and joints of right foot: Secondary | ICD-10-CM | POA: Diagnosis not present

## 2021-10-23 DIAGNOSIS — R262 Difficulty in walking, not elsewhere classified: Secondary | ICD-10-CM | POA: Diagnosis not present

## 2021-10-23 DIAGNOSIS — M6281 Muscle weakness (generalized): Secondary | ICD-10-CM | POA: Diagnosis not present

## 2021-10-28 DIAGNOSIS — M25571 Pain in right ankle and joints of right foot: Secondary | ICD-10-CM | POA: Diagnosis not present

## 2021-10-28 DIAGNOSIS — R262 Difficulty in walking, not elsewhere classified: Secondary | ICD-10-CM | POA: Diagnosis not present

## 2021-10-28 DIAGNOSIS — M25674 Stiffness of right foot, not elsewhere classified: Secondary | ICD-10-CM | POA: Diagnosis not present

## 2021-10-28 DIAGNOSIS — M6281 Muscle weakness (generalized): Secondary | ICD-10-CM | POA: Diagnosis not present

## 2021-10-29 DIAGNOSIS — J301 Allergic rhinitis due to pollen: Secondary | ICD-10-CM | POA: Diagnosis not present

## 2021-10-30 DIAGNOSIS — M6281 Muscle weakness (generalized): Secondary | ICD-10-CM | POA: Diagnosis not present

## 2021-10-30 DIAGNOSIS — M25674 Stiffness of right foot, not elsewhere classified: Secondary | ICD-10-CM | POA: Diagnosis not present

## 2021-10-30 DIAGNOSIS — R262 Difficulty in walking, not elsewhere classified: Secondary | ICD-10-CM | POA: Diagnosis not present

## 2021-10-30 DIAGNOSIS — M25571 Pain in right ankle and joints of right foot: Secondary | ICD-10-CM | POA: Diagnosis not present

## 2021-10-31 DIAGNOSIS — M7918 Myalgia, other site: Secondary | ICD-10-CM | POA: Diagnosis not present

## 2021-10-31 DIAGNOSIS — M542 Cervicalgia: Secondary | ICD-10-CM | POA: Diagnosis not present

## 2021-11-04 DIAGNOSIS — M6281 Muscle weakness (generalized): Secondary | ICD-10-CM | POA: Diagnosis not present

## 2021-11-04 DIAGNOSIS — M25674 Stiffness of right foot, not elsewhere classified: Secondary | ICD-10-CM | POA: Diagnosis not present

## 2021-11-04 DIAGNOSIS — M25571 Pain in right ankle and joints of right foot: Secondary | ICD-10-CM | POA: Diagnosis not present

## 2021-11-04 DIAGNOSIS — R262 Difficulty in walking, not elsewhere classified: Secondary | ICD-10-CM | POA: Diagnosis not present

## 2021-11-05 DIAGNOSIS — J301 Allergic rhinitis due to pollen: Secondary | ICD-10-CM | POA: Diagnosis not present

## 2021-11-06 DIAGNOSIS — M19071 Primary osteoarthritis, right ankle and foot: Secondary | ICD-10-CM | POA: Diagnosis not present

## 2021-11-06 DIAGNOSIS — S93314D Dislocation of tarsal joint of right foot, subsequent encounter: Secondary | ICD-10-CM | POA: Diagnosis not present

## 2021-11-06 DIAGNOSIS — W11XXXD Fall on and from ladder, subsequent encounter: Secondary | ICD-10-CM | POA: Diagnosis not present

## 2021-11-06 DIAGNOSIS — Z87891 Personal history of nicotine dependence: Secondary | ICD-10-CM | POA: Diagnosis not present

## 2021-11-06 DIAGNOSIS — M7731 Calcaneal spur, right foot: Secondary | ICD-10-CM | POA: Diagnosis not present

## 2021-11-06 DIAGNOSIS — M7989 Other specified soft tissue disorders: Secondary | ICD-10-CM | POA: Diagnosis not present

## 2021-11-06 DIAGNOSIS — J301 Allergic rhinitis due to pollen: Secondary | ICD-10-CM | POA: Diagnosis not present

## 2021-11-07 DIAGNOSIS — M25571 Pain in right ankle and joints of right foot: Secondary | ICD-10-CM | POA: Diagnosis not present

## 2021-11-07 DIAGNOSIS — M6281 Muscle weakness (generalized): Secondary | ICD-10-CM | POA: Diagnosis not present

## 2021-11-07 DIAGNOSIS — R262 Difficulty in walking, not elsewhere classified: Secondary | ICD-10-CM | POA: Diagnosis not present

## 2021-11-07 DIAGNOSIS — M25674 Stiffness of right foot, not elsewhere classified: Secondary | ICD-10-CM | POA: Diagnosis not present

## 2021-11-11 ENCOUNTER — Other Ambulatory Visit: Payer: Self-pay

## 2021-11-11 ENCOUNTER — Encounter: Payer: Self-pay | Admitting: Family Medicine

## 2021-11-11 DIAGNOSIS — R109 Unspecified abdominal pain: Secondary | ICD-10-CM

## 2021-11-11 DIAGNOSIS — M25674 Stiffness of right foot, not elsewhere classified: Secondary | ICD-10-CM | POA: Diagnosis not present

## 2021-11-11 DIAGNOSIS — R262 Difficulty in walking, not elsewhere classified: Secondary | ICD-10-CM | POA: Diagnosis not present

## 2021-11-11 DIAGNOSIS — M6281 Muscle weakness (generalized): Secondary | ICD-10-CM | POA: Diagnosis not present

## 2021-11-11 DIAGNOSIS — M25571 Pain in right ankle and joints of right foot: Secondary | ICD-10-CM | POA: Diagnosis not present

## 2021-11-11 DIAGNOSIS — M545 Low back pain, unspecified: Secondary | ICD-10-CM

## 2021-11-11 MED ORDER — MELOXICAM 15 MG PO TABS: 15.0000 mg | ORAL_TABLET | Freq: Every day | ORAL | 1 refills | Status: DC

## 2021-11-12 DIAGNOSIS — J301 Allergic rhinitis due to pollen: Secondary | ICD-10-CM | POA: Diagnosis not present

## 2021-11-15 DIAGNOSIS — R262 Difficulty in walking, not elsewhere classified: Secondary | ICD-10-CM | POA: Diagnosis not present

## 2021-11-15 DIAGNOSIS — M25571 Pain in right ankle and joints of right foot: Secondary | ICD-10-CM | POA: Diagnosis not present

## 2021-11-15 DIAGNOSIS — M25674 Stiffness of right foot, not elsewhere classified: Secondary | ICD-10-CM | POA: Diagnosis not present

## 2021-11-15 DIAGNOSIS — M6281 Muscle weakness (generalized): Secondary | ICD-10-CM | POA: Diagnosis not present

## 2021-11-18 ENCOUNTER — Ambulatory Visit (INDEPENDENT_AMBULATORY_CARE_PROVIDER_SITE_OTHER): Payer: Medicare PPO | Admitting: Family Medicine

## 2021-11-18 ENCOUNTER — Other Ambulatory Visit: Payer: Self-pay

## 2021-11-18 ENCOUNTER — Encounter: Payer: Self-pay | Admitting: Family Medicine

## 2021-11-18 VITALS — BP 130/80 | HR 72 | Ht 64.0 in | Wt 159.0 lb

## 2021-11-18 DIAGNOSIS — R29898 Other symptoms and signs involving the musculoskeletal system: Secondary | ICD-10-CM

## 2021-11-18 NOTE — Progress Notes (Signed)
? ? ?Date:  11/18/2021  ? ?Name:  Masato Zunk   DOB:  June 18, 1951   MRN:  [id number] ? ? ?Chief Complaint: jaw popping (Occurs when chewing- lower jaw) ? ?Patient is a 71 year old male who presents for a TMJ exam. The patient reports the following problems: right sided popping. Health maintenance has been reviewed UTD ?  ? ? ?Lab Results  ?Component Value Date  ? NA 135 04/02/2021  ? K 4.1 04/02/2021  ? CO2 22 04/02/2021  ? GLUCOSE 92 04/02/2021  ? BUN 14 04/02/2021  ? CREATININE 1.00 04/02/2021  ? CALCIUM 9.3 04/02/2021  ? GFRNONAA >60 04/02/2021  ? ?Lab Results  ?Component Value Date  ? CHOL 152 07/08/2021  ? HDL 81 07/08/2021  ? [place] 59 07/08/2021  ? TRIG 57 07/08/2021  ? ?Lab Results  ?Component Value Date  ? TSH 0.69 07/08/2018  ? ?Lab Results  ?Component Value Date  ? HGBA1C 5.4 04/06/2019  ? ?Lab Results  ?Component Value Date  ? WBC 5.6 04/02/2021  ? HGB 14.4 04/02/2021  ? HCT 40.8 04/02/2021  ? MCV 93.6 04/02/2021  ? PLT 238 04/02/2021  ? ?Lab Results  ?Component Value Date  ? ALT 19 07/08/2021  ? AST 17 07/08/2021  ? ALKPHOS 110 09/03/2021  ? BILITOT 0.3 07/08/2021  ? ?No results found for: 25OHVITD2, [place], VD25OH  ? ?Review of Systems  ?Constitutional:  Negative for chills and fever.  ?HENT:  Negative for dental problem, drooling, ear discharge, ear pain, facial swelling, hearing loss and sore throat.   ?Respiratory:  Negative for cough and shortness of breath.   ?Cardiovascular:  Negative for palpitations.  ?Gastrointestinal:  Negative for abdominal pain, blood in stool, constipation, diarrhea and nausea.  ?Endocrine: Negative for polydipsia.  ?Genitourinary:  Negative for urgency.  ?Musculoskeletal:  Negative for back pain, myalgias and neck pain.  ?Skin:  Negative for rash.  ?Allergic/Immunologic: Negative for environmental allergies.  ?Neurological:  Negative for dizziness and headaches.  ?Hematological:  Does not bruise/bleed easily.  ?Psychiatric/Behavioral:  Negative for suicidal ideas. The  patient is not nervous/anxious.   ? ?Patient Active Problem List  ? Diagnosis Date Noted  ? Acute esophagitis   ? Columnar-lined esophagus   ? Hiatal hernia   ? Hyperlipidemia, mixed 07/01/2021  ? Insomnia disorder 07/01/2021  ? Environmental and seasonal allergies 07/01/2021  ? Esophageal dysphagia   ? Duodenal erythema   ? Esophagitis   ? Esophageal stenosis   ? Severe persistent asthma without complication [id number]  ? Closed dislocation of tarsal joint of right foot 02/09/2021  ? Migraines 12/21/2017  ? ? ?No Known Allergies ? ?Past Surgical History:  ?Procedure Laterality Date  ? BIOPSY N/A 04/29/2021  ? Procedure: BIOPSY;  Surgeon: Virgel Manifold, MD;  Location: [place];  Service: Endoscopy;  Laterality: N/A;  ? ELBOW SURGERY Right 2007  ? ESOPHAGOGASTRODUODENOSCOPY (EGD) WITH PROPOFOL N/A 04/29/2021  ? Procedure: ESOPHAGOGASTRODUODENOSCOPY (EGD) WITH PROPOFOL;  Surgeon: Virgel Manifold, MD;  Location: [place];  Service: Endoscopy;  Laterality: N/A;  ? ESOPHAGOGASTRODUODENOSCOPY (EGD) WITH PROPOFOL N/A 07/22/2021  ? Procedure: ESOPHAGOGASTRODUODENOSCOPY (EGD) WITH PROPOFOL;  Surgeon: Virgel Manifold, MD;  Location: [place];  Service: Endoscopy;  Laterality: N/A;  ? ETHMOIDECTOMY Bilateral 10/27/2019  ? Procedure: TOTAL ETHMOIDECTOMY WITH FRONTAL SINUOTOMY;  Surgeon: Margaretha Sheffield, MD;  Location: [place];  Service: ENT;  Laterality: Bilateral;  ? EYE SURGERY  2019  ? Replaced  ? HAND SURGERY  [place]  ? IMAGE GUIDED SINUS SURGERY Bilateral 10/27/2019  ? Procedure: IMAGE GUIDED SINUS SURGERY;  Surgeon: Margaretha Sheffield, MD;  Location: [place];  Service: ENT;  Laterality: Bilateral;  need stryker disk ?placed disk on or charge nurse desk 2-17 ?  kp  ? IMAGE GUIDED SINUS SURGERY Bilateral 04/26/2020  ? Procedure: IMAGE GUIDED SINUS SURGERY;  Surgeon: Margaretha Sheffield, MD;  Location: [place];  Service: ENT;   Laterality: Bilateral;  need stryker disk ?gave Brenda disk on 8-23 kp  ? MENISCUS REPAIR Left 2018  ? SEPTOPLASTY Bilateral 10/27/2019  ? Procedure: SEPTOPLASTY REVISION;  Surgeon: Margaretha Sheffield, MD;  Location: [place];  Service: ENT;  Laterality: Bilateral;  ? SPHENOIDECTOMY Right 04/26/2020  ? Procedure: SPHENOIDECTOMY;  Surgeon: Margaretha Sheffield, MD;  Location: [place];  Service: ENT;  Laterality: Right;  ? TONSILLECTOMY  1964  ? ? ?Social History  ? ?Tobacco Use  ? Smoking status: Former  ?  Packs/day: 1.00  ?  Years: 26.00  ?  Pack years: 26.00  ?  Types: Cigarettes  ?  Quit date: 09/01/1988  ?  Years since quitting: 33.2  ? Smokeless tobacco: Never  ?Vaping Use  ? Vaping Use: Never used  ?Substance Use Topics  ? Alcohol use: Yes  ?  Alcohol/week: 7.0 standard drinks  ?  Types: 7 Cans of beer per week  ?  Comment: Social drinking only  ? Drug use: Never  ? ? ? ?Medication list has been reviewed and updated. ? ?Current Meds  ?Medication Sig  ? APPLE CIDER VINEGAR PO Take 500 mg by mouth.  ? budesonide (PULMICORT) 0.5 MG/2ML nebulizer solution Inhale into the lungs.  ? Calcium Carbonate-Vitamin D (RA CALCIUM PLUS VITAMIN D PO) Take 525 mg by mouth 2 (two) times daily.  ? Cholecalciferol (VITAMIN D) 50 MCG (2000 UT) CAPS Take 1 capsule by mouth daily.  ? Coenzyme Q10 (COQ-10) 100 MG CAPS Take 1 each by mouth 2 (two) times daily.  ? DULoxetine (CYMBALTA) 60 MG capsule Take 1 capsule by mouth daily.  ? EPINEPHrine 0.3 mg/0.3 mL IJ SOAJ injection   ? fexofenadine (ALLEGRA) 180 MG tablet Take 180 mg by mouth daily.  ? fluticasone (FLONASE) 50 MCG/ACT nasal spray Place 2 sprays into both nostrils daily.  ? Glucosamine-Chondroitin (GLUCOSAMINE CHONDR COMPLEX PO) Take 3,700 mg by mouth daily.  ? ibuprofen (ADVIL) 600 MG tablet Take 600 mg by mouth every 6 (six) hours as needed.  ? ipratropium-albuterol (DUONEB) 0.5-2.5 (3) MG/3ML SOLN Inhale into the lungs.  ? Lasmiditan Succinate (REYVOW) 100 MG TABS  Take by mouth.  ? meloxicam (MOBIC) 15 MG tablet Take 1 tablet (15 mg total) by mouth daily.  ? naratriptan (AMERGE) 2.5 MG tablet   ? omeprazole (PRILOSEC) 20 MG capsule Take 1 capsule (20 mg total) by mouth daily.  ? polycarbophil (FIBERCON) 625 MG tablet Take 1,250 mg by mouth 2 (two) times daily.  ? Riboflavin 400 MG TABS Take 1 tablet by mouth daily.  ? rosuvastatin (CRESTOR) 10 MG tablet TAKE 1 TABLET EVERY DAY  (DUE  FOR  BLOOD  WORK)  ? traZODone (DESYREL) 50 MG tablet TAKE 1 TO 2 TABLETS AT BEDTIME AS NEEDED FOR SLEEP  ? vitamin C (ASCORBIC ACID) 500 MG tablet Take 500 mg by mouth daily.  ? Vitamin E 180 MG CAPS Take 1 capsule by mouth daily.  ? Vitamins-Lipotropics (LIPO-FLAVONOID PLUS PO) Take 1,000 mg by  mouth 2 (two) times daily.  ? Zinc 50 MG CAPS Take 1 capsule by mouth daily.  ? ? ?PHQ 2/9 Scores 07/08/2021 07/01/2021 06/24/2021 05/22/2021  ?PHQ - 2 Score 0 0 0 0  ?PHQ- 9 Score 0 0 1 5  ? ? ?GAD 7 : Generalized Anxiety Score 07/08/2021 07/01/2021 05/22/2021 01/14/2021  ?Nervous, Anxious, on Edge 0 0 0 0  ?Control/stop worrying 0 0 0 0  ?Worry too much - different things 0 0 0 0  ?Trouble relaxing 0 0 0 0  ?Restless 0 0 0 0  ?Easily annoyed or irritable 0 0 0 0  ?Afraid - awful might happen 0 0 0 0  ?Total GAD 7 Score 0 0 0 0  ?Anxiety Difficulty - Not difficult at all - -  ? ? ?BP Readings from Last 3 Encounters:  ?11/18/21 130/80  ?09/12/21 130/90  ?07/22/21 102/70  ? ? ?Physical Exam ?Vitals and nursing note reviewed.  ?HENT:  ?   Head: Normocephalic.  ?   Right Ear: Tympanic membrane, ear canal and external ear normal.  ?   Left Ear: Tympanic membrane, ear canal and external ear normal.  ?   Nose: Nose normal.  ?   Right Turbinates: Not swollen.  ?   Left Turbinates: Not swollen.  ?   Mouth/Throat:  ?   Lips: Pink.  ?   Mouth: Mucous membranes are moist.  ?   Dentition: Normal dentition.  ?   Tongue: No lesions.  ?   Palate: No mass.  ?   Pharynx: Oropharynx is clear. Uvula midline.  ?Eyes:  ?    General: No scleral icterus.    ?   Right eye: No discharge.     ?   Left eye: No discharge.  ?   Conjunctiva/sclera: Conjunctivae normal.  ?   Pupils: Pupils are equal, round, and reactive to light.  ?Neck:  ?   Thyroi

## 2021-11-18 NOTE — Patient Instructions (Signed)
Temporomandibular Joint Syndrome °Temporomandibular joint syndrome (TMJ syndrome) is a condition that causes pain in the temporomandibular joints. These joints are located near your ears and allow your jaw to open and close. For people with TMJ syndrome, chewing, biting, or other movements of the jaw can be difficult or painful. °TMJ syndrome is often mild and goes away within a few weeks. However, sometimes the condition becomes a long-term (chronic) problem. °What are the causes? °This condition may be caused by: °Grinding your teeth or clenching your jaw. Some people do this when they are stressed. °Arthritis. °An injury to the jaw. °A head or neck injury. °Teeth or dentures that are not aligned well. °In some cases, the cause of TMJ syndrome may not be known. °What are the signs or symptoms? °The most common symptom of this condition is aching pain on the side of the head in the area of the TMJ. Other symptoms may include: °Pain when moving your jaw, such as when chewing or biting. °Not being able to open your jaw all the way. °Making a clicking sound when you open your mouth. °Headache. °Earache. °Neck or shoulder pain. °How is this diagnosed? °This condition may be diagnosed based on: °Your symptoms and medical history. °A physical exam. Your health care provider may check the range of motion of your jaw. °Imaging tests, such as X-rays or an MRI. °You may also need to see your dentist, who will check if your teeth and jaw are lined up correctly. °How is this treated? °TMJ syndrome often goes away on its own. If treatment is needed, it may include: °Eating soft foods and applying ice or heat. °Medicines to relieve pain or inflammation. °Medicines or massage to relax the muscles. °A splint, bite plate, or mouthpiece to prevent teeth grinding or jaw clenching. °Relaxation techniques or counseling to help reduce stress. °A therapy for pain in which an electrical current is applied to the nerves through the skin  (transcutaneous electrical nerve stimulation). °Acupuncture. This may help to relieve pain. °Jaw surgery. This is rarely needed. °Follow these instructions at home: °Eating and drinking °Eat a soft diet if you are having trouble chewing. °Avoid foods that require a lot of chewing. Do not chew gum. °General instructions °Take over-the-counter and prescription medicines only as told by your health care provider. °If directed, put ice on the painful area. To do this: °Put ice in a plastic bag. °Place a towel between your skin and the bag. °Leave the ice on for 20 minutes, 2-3 times a day. °Remove the ice if your skin turns bright red. This is very important. If you cannot feel pain, heat, or cold, you have a greater risk of damage to the area. °Apply a warm, wet cloth (warm compress) to the painful area as told. °Massage your jaw area and do any jaw stretching exercises as told by your health care provider. °If you were given a splint, bite plate, or mouthpiece, wear it as told by your health care provider. °Keep all follow-up visits. This is important. °Where to find more information °National Institute of [employer]io[employer]: www.nidcr.nih.gov °Contact a health care provider if: °You have trouble eating. °You have new or worsening symptoms. °Get help right away if: °Your jaw locks. °Summary °Temporomandibular joint syndrome (TMJ syndrome) is a condition that causes pain in the temporomandibular joints. These joints are located near your ears and allow your jaw to open and close. °TMJ syndrome is often mild and goes away within a few   weeks. However, sometimes the condition becomes a long-term (chronic) problem. °Symptoms include an aching pain on the side of the head in the area of the TMJ, pain when chewing or biting, and being unable to open your jaw all the way. You may also make a clicking sound when you open your mouth. °TMJ syndrome often goes away on its own. If treatment is needed, it may include  medicines to relieve pain, reduce inflammation, or relax the muscles. A splint, bite plate, or mouthpiece may also be used to prevent teeth grinding or jaw clenching. °This information is not intended to replace advice given to you by your health care provider. Make sure you discuss any questions you have with your health care provider. °Document Revised: 03/31/2021 Document Reviewed: 03/31/2021 °Elsevier Patient Education © 2022 Elsevier Inc. ° °

## 2021-11-19 DIAGNOSIS — M25674 Stiffness of right foot, not elsewhere classified: Secondary | ICD-10-CM | POA: Diagnosis not present

## 2021-11-19 DIAGNOSIS — J301 Allergic rhinitis due to pollen: Secondary | ICD-10-CM | POA: Diagnosis not present

## 2021-11-19 DIAGNOSIS — M25571 Pain in right ankle and joints of right foot: Secondary | ICD-10-CM | POA: Diagnosis not present

## 2021-11-19 DIAGNOSIS — M6281 Muscle weakness (generalized): Secondary | ICD-10-CM | POA: Diagnosis not present

## 2021-11-19 DIAGNOSIS — R262 Difficulty in walking, not elsewhere classified: Secondary | ICD-10-CM | POA: Diagnosis not present

## 2021-11-25 DIAGNOSIS — M26601 Right temporomandibular joint disorder, unspecified: Secondary | ICD-10-CM | POA: Diagnosis not present

## 2021-11-26 DIAGNOSIS — R262 Difficulty in walking, not elsewhere classified: Secondary | ICD-10-CM | POA: Diagnosis not present

## 2021-11-26 DIAGNOSIS — M6281 Muscle weakness (generalized): Secondary | ICD-10-CM | POA: Diagnosis not present

## 2021-11-26 DIAGNOSIS — M25571 Pain in right ankle and joints of right foot: Secondary | ICD-10-CM | POA: Diagnosis not present

## 2021-11-26 DIAGNOSIS — J301 Allergic rhinitis due to pollen: Secondary | ICD-10-CM | POA: Diagnosis not present

## 2021-11-26 DIAGNOSIS — M25674 Stiffness of right foot, not elsewhere classified: Secondary | ICD-10-CM | POA: Diagnosis not present

## 2021-11-28 DIAGNOSIS — M26601 Right temporomandibular joint disorder, unspecified: Secondary | ICD-10-CM | POA: Diagnosis not present

## 2021-11-29 DIAGNOSIS — M25571 Pain in right ankle and joints of right foot: Secondary | ICD-10-CM | POA: Diagnosis not present

## 2021-11-29 DIAGNOSIS — M25674 Stiffness of right foot, not elsewhere classified: Secondary | ICD-10-CM | POA: Diagnosis not present

## 2021-11-29 DIAGNOSIS — M6281 Muscle weakness (generalized): Secondary | ICD-10-CM | POA: Diagnosis not present

## 2021-11-29 DIAGNOSIS — R262 Difficulty in walking, not elsewhere classified: Secondary | ICD-10-CM | POA: Diagnosis not present

## 2021-12-03 DIAGNOSIS — S93314D Dislocation of tarsal joint of right foot, subsequent encounter: Secondary | ICD-10-CM | POA: Diagnosis not present

## 2021-12-03 DIAGNOSIS — T8484XA Pain due to internal orthopedic prosthetic devices, implants and grafts, initial encounter: Secondary | ICD-10-CM | POA: Diagnosis not present

## 2021-12-03 DIAGNOSIS — W11XXXD Fall on and from ladder, subsequent encounter: Secondary | ICD-10-CM | POA: Diagnosis not present

## 2021-12-03 DIAGNOSIS — J301 Allergic rhinitis due to pollen: Secondary | ICD-10-CM | POA: Diagnosis not present

## 2021-12-03 DIAGNOSIS — M25674 Stiffness of right foot, not elsewhere classified: Secondary | ICD-10-CM | POA: Diagnosis not present

## 2021-12-03 DIAGNOSIS — M26601 Right temporomandibular joint disorder, unspecified: Secondary | ICD-10-CM | POA: Diagnosis not present

## 2021-12-03 DIAGNOSIS — M19079 Primary osteoarthritis, unspecified ankle and foot: Secondary | ICD-10-CM | POA: Diagnosis not present

## 2021-12-04 DIAGNOSIS — Z872 Personal history of diseases of the skin and subcutaneous tissue: Secondary | ICD-10-CM | POA: Diagnosis not present

## 2021-12-04 DIAGNOSIS — M26601 Right temporomandibular joint disorder, unspecified: Secondary | ICD-10-CM | POA: Diagnosis not present

## 2021-12-04 DIAGNOSIS — Z859 Personal history of malignant neoplasm, unspecified: Secondary | ICD-10-CM | POA: Diagnosis not present

## 2021-12-04 DIAGNOSIS — L570 Actinic keratosis: Secondary | ICD-10-CM | POA: Diagnosis not present

## 2021-12-04 DIAGNOSIS — L578 Other skin changes due to chronic exposure to nonionizing radiation: Secondary | ICD-10-CM | POA: Diagnosis not present

## 2021-12-05 DIAGNOSIS — R262 Difficulty in walking, not elsewhere classified: Secondary | ICD-10-CM | POA: Diagnosis not present

## 2021-12-05 DIAGNOSIS — M6281 Muscle weakness (generalized): Secondary | ICD-10-CM | POA: Diagnosis not present

## 2021-12-05 DIAGNOSIS — M25674 Stiffness of right foot, not elsewhere classified: Secondary | ICD-10-CM | POA: Diagnosis not present

## 2021-12-05 DIAGNOSIS — M25571 Pain in right ankle and joints of right foot: Secondary | ICD-10-CM | POA: Diagnosis not present

## 2021-12-06 DIAGNOSIS — M6281 Muscle weakness (generalized): Secondary | ICD-10-CM | POA: Diagnosis not present

## 2021-12-06 DIAGNOSIS — M25571 Pain in right ankle and joints of right foot: Secondary | ICD-10-CM | POA: Diagnosis not present

## 2021-12-06 DIAGNOSIS — W11XXXD Fall on and from ladder, subsequent encounter: Secondary | ICD-10-CM | POA: Diagnosis not present

## 2021-12-06 DIAGNOSIS — M19071 Primary osteoarthritis, right ankle and foot: Secondary | ICD-10-CM | POA: Diagnosis not present

## 2021-12-06 DIAGNOSIS — R262 Difficulty in walking, not elsewhere classified: Secondary | ICD-10-CM | POA: Diagnosis not present

## 2021-12-06 DIAGNOSIS — M25674 Stiffness of right foot, not elsewhere classified: Secondary | ICD-10-CM | POA: Diagnosis not present

## 2021-12-06 DIAGNOSIS — Q666 Other congenital valgus deformities of feet: Secondary | ICD-10-CM | POA: Diagnosis not present

## 2021-12-06 DIAGNOSIS — S93314D Dislocation of tarsal joint of right foot, subsequent encounter: Secondary | ICD-10-CM | POA: Diagnosis not present

## 2021-12-10 DIAGNOSIS — R262 Difficulty in walking, not elsewhere classified: Secondary | ICD-10-CM | POA: Diagnosis not present

## 2021-12-10 DIAGNOSIS — M25571 Pain in right ankle and joints of right foot: Secondary | ICD-10-CM | POA: Diagnosis not present

## 2021-12-10 DIAGNOSIS — M6281 Muscle weakness (generalized): Secondary | ICD-10-CM | POA: Diagnosis not present

## 2021-12-10 DIAGNOSIS — J301 Allergic rhinitis due to pollen: Secondary | ICD-10-CM | POA: Diagnosis not present

## 2021-12-10 DIAGNOSIS — M25674 Stiffness of right foot, not elsewhere classified: Secondary | ICD-10-CM | POA: Diagnosis not present

## 2021-12-11 DIAGNOSIS — M26601 Right temporomandibular joint disorder, unspecified: Secondary | ICD-10-CM | POA: Diagnosis not present

## 2021-12-12 DIAGNOSIS — R262 Difficulty in walking, not elsewhere classified: Secondary | ICD-10-CM | POA: Diagnosis not present

## 2021-12-12 DIAGNOSIS — M25571 Pain in right ankle and joints of right foot: Secondary | ICD-10-CM | POA: Diagnosis not present

## 2021-12-12 DIAGNOSIS — M25674 Stiffness of right foot, not elsewhere classified: Secondary | ICD-10-CM | POA: Diagnosis not present

## 2021-12-12 DIAGNOSIS — M6281 Muscle weakness (generalized): Secondary | ICD-10-CM | POA: Diagnosis not present

## 2021-12-13 DIAGNOSIS — M26601 Right temporomandibular joint disorder, unspecified: Secondary | ICD-10-CM | POA: Diagnosis not present

## 2021-12-17 DIAGNOSIS — J301 Allergic rhinitis due to pollen: Secondary | ICD-10-CM | POA: Diagnosis not present

## 2021-12-24 DIAGNOSIS — J301 Allergic rhinitis due to pollen: Secondary | ICD-10-CM | POA: Diagnosis not present

## 2021-12-25 DIAGNOSIS — J4550 Severe persistent asthma, uncomplicated: Secondary | ICD-10-CM | POA: Diagnosis not present

## 2021-12-26 ENCOUNTER — Other Ambulatory Visit: Payer: Self-pay | Admitting: Family Medicine

## 2021-12-26 DIAGNOSIS — E785 Hyperlipidemia, unspecified: Secondary | ICD-10-CM

## 2021-12-31 DIAGNOSIS — J301 Allergic rhinitis due to pollen: Secondary | ICD-10-CM | POA: Diagnosis not present

## 2022-01-05 ENCOUNTER — Other Ambulatory Visit: Payer: Self-pay | Admitting: Family Medicine

## 2022-01-05 DIAGNOSIS — M545 Low back pain, unspecified: Secondary | ICD-10-CM

## 2022-01-05 DIAGNOSIS — R109 Unspecified abdominal pain: Secondary | ICD-10-CM

## 2022-01-06 ENCOUNTER — Encounter: Payer: Self-pay | Admitting: Family Medicine

## 2022-01-06 ENCOUNTER — Ambulatory Visit (INDEPENDENT_AMBULATORY_CARE_PROVIDER_SITE_OTHER): Payer: Medicare PPO | Admitting: Family Medicine

## 2022-01-06 VITALS — BP 138/68 | HR 64 | Ht 64.0 in | Wt 157.0 lb

## 2022-01-06 DIAGNOSIS — F5101 Primary insomnia: Secondary | ICD-10-CM | POA: Diagnosis not present

## 2022-01-06 DIAGNOSIS — E785 Hyperlipidemia, unspecified: Secondary | ICD-10-CM

## 2022-01-06 MED ORDER — ROSUVASTATIN CALCIUM 10 MG PO TABS: ORAL_TABLET | ORAL | 1 refills | Status: DC

## 2022-01-06 MED ORDER — TRAZODONE HCL 50 MG PO TABS: ORAL_TABLET | ORAL | 1 refills | Status: DC

## 2022-01-06 NOTE — Progress Notes (Signed)
? ? ?Date:  01/06/2022  ? ?Name:  Blake Patrick   DOB:  02/08/1951   MRN:  [id number] ? ? ?Chief Complaint: Hyperlipidemia and Insomnia ? ?Hyperlipidemia ?This is a chronic problem. The current episode started more than 1 year ago. The problem is controlled. Recent lipid tests were reviewed and are normal. He has no history of chronic renal disease, diabetes, hypothyroidism, liver disease, obesity or nephrotic syndrome. Pertinent negatives include no chest pain, focal sensory loss, leg pain, myalgias or shortness of breath. Current antihyperlipidemic treatment includes statins. There are no compliance problems.  There are no known risk factors for coronary artery disease.  ?Insomnia ?Primary symptoms: no difficulty falling asleep, no premature morning awakening.   ?The current episode started more than one year. The problem occurs intermittently. The problem has been resolved (on medication) since onset. Past treatments include medication (trazadone).  ? ?Lab Results  ?Component Value Date  ? NA 135 04/02/2021  ? K 4.1 04/02/2021  ? CO2 22 04/02/2021  ? GLUCOSE 92 04/02/2021  ? BUN 14 04/02/2021  ? CREATININE 1.00 04/02/2021  ? CALCIUM 9.3 04/02/2021  ? GFRNONAA >60 04/02/2021  ? ?Lab Results  ?Component Value Date  ? CHOL 152 07/08/2021  ? HDL 81 07/08/2021  ? LDLCALC 59 07/08/2021  ? TRIG 57 07/08/2021  ? ?Lab Results  ?Component Value Date  ? TSH 0.69 07/08/2018  ? ?Lab Results  ?Component Value Date  ? HGBA1C 5.4 04/06/2019  ? ?Lab Results  ?Component Value Date  ? WBC 5.6 04/02/2021  ? HGB 14.4 04/02/2021  ? HCT 40.8 04/02/2021  ? MCV 93.6 04/02/2021  ? PLT 238 04/02/2021  ? ?Lab Results  ?Component Value Date  ? ALT 19 07/08/2021  ? AST 17 07/08/2021  ? ALKPHOS 110 09/03/2021  ? BILITOT 0.3 07/08/2021  ? ?No results found for: 25OHVITD2, 25OHVITD3, VD25OH  ? ?Review of Systems  ?Constitutional:  Negative for chills and fever.  ?HENT:  Negative for drooling, ear discharge, ear pain and sore throat.   ?Respiratory:   Negative for cough, shortness of breath and wheezing.   ?Cardiovascular:  Negative for chest pain, palpitations and leg swelling.  ?Gastrointestinal:  Negative for abdominal pain, blood in stool, constipation, diarrhea and nausea.  ?Endocrine: Negative for polydipsia.  ?Genitourinary:  Negative for dysuria, frequency, hematuria and urgency.  ?Musculoskeletal:  Negative for back pain, myalgias and neck pain.  ?Skin:  Negative for rash.  ?Allergic/Immunologic: Negative for environmental allergies.  ?Neurological:  Negative for dizziness and headaches.  ?Hematological:  Does not bruise/bleed easily.  ?Psychiatric/Behavioral:  Negative for suicidal ideas. The patient has insomnia. The patient is not nervous/anxious.   ? ?Patient Active Problem List  ? Diagnosis Date Noted  ? Acute esophagitis   ? Columnar-lined esophagus   ? Hiatal hernia   ? Hyperlipidemia, mixed 07/01/2021  ? Insomnia disorder 07/01/2021  ? Environmental and seasonal allergies 07/01/2021  ? Esophageal dysphagia   ? Duodenal erythema   ? Esophagitis   ? Esophageal stenosis   ? Severe persistent asthma without complication 02/22/2021  ? Closed dislocation of tarsal joint of right foot 02/09/2021  ? Migraines 12/21/2017  ? ? ?No Known Allergies ? ?Past Surgical History:  ?Procedure Laterality Date  ? BIOPSY N/A 04/29/2021  ? Procedure: BIOPSY;  Surgeon: Pasty Spillersahiliani, Varnita B, MD;  [facility];  Service: Endoscopy;  Laterality: N/A;  ? ELBOW SURGERY Right 2007  ? ESOPHAGOGASTRODUODENOSCOPY (EGD) WITH PROPOFOL N/A 04/29/2021  ? Procedure: ESOPHAGOGASTRODUODENOSCOPY (EGD)  WITH PROPOFOL;  Surgeon: Pasty Spillers, MD;  Location: [facility];  Service: Endoscopy;  Laterality: N/A;  ? ESOPHAGOGASTRODUODENOSCOPY (EGD) WITH PROPOFOL N/A 07/22/2021  ? Procedure: ESOPHAGOGASTRODUODENOSCOPY (EGD) WITH PROPOFOL;  Surgeon: Pasty Spillers, MD;  Location: [facility];  Service: Endoscopy;  Laterality: N/A;  ? ETHMOIDECTOMY  Bilateral 10/27/2019  ? Procedure: TOTAL ETHMOIDECTOMY WITH FRONTAL SINUOTOMY;  Surgeon: Vernie Murders, MD;  Location: [facility];  Service: ENT;  Laterality: Bilateral;  ? EYE SURGERY  2019  ? Replaced  ? HAND SURGERY  1983  ? HERNIA REPAIR  1995  ? IMAGE GUIDED SINUS SURGERY Bilateral 10/27/2019  ? Procedure: IMAGE GUIDED SINUS SURGERY;  Surgeon: Vernie Murders, MD;  Location: [facility];  Service: ENT;  Laterality: Bilateral;  need stryker disk ?placed disk on or charge nurse desk 2-17 ?  kp  ? IMAGE GUIDED SINUS SURGERY Bilateral 04/26/2020  ? Procedure: IMAGE GUIDED SINUS SURGERY;  Surgeon: Vernie Murders, MD;  Location: [facility];  Service: ENT;  Laterality: Bilateral;  need stryker disk ?gave Brenda disk on 8-23 kp  ? MENISCUS REPAIR Left 2018  ? SEPTOPLASTY Bilateral 10/27/2019  ? Procedure: SEPTOPLASTY REVISION;  Surgeon: Vernie Murders, MD;  Location: [facility];  Service: ENT;  Laterality: Bilateral;  ? SPHENOIDECTOMY Right 04/26/2020  ? Procedure: SPHENOIDECTOMY;  Surgeon: Vernie Murders, MD;  Location: [facility];  Service: ENT;  Laterality: Right;  ? TONSILLECTOMY  1964  ? ? ?Social History  ? ?Tobacco Use  ? Smoking status: Former  ?  Packs/day: 1.00  ?  Years: 26.00  ?  Pack years: 26.00  ?  Types: Cigarettes  ?  Quit date: 09/01/1988  ?  Years since quitting: 33.3  ? Smokeless tobacco: Never  ?Vaping Use  ? Vaping Use: Never used  ?Substance Use Topics  ? Alcohol use: Yes  ?  Alcohol/week: 7.0 standard drinks  ?  Types: 7 Cans of beer per week  ?  Comment: Social drinking only  ? Drug use: Never  ? ? ? ?Medication list has been reviewed and updated. ? ?Current Meds  ?Medication Sig  ? APPLE CIDER VINEGAR PO Take 500 mg by mouth.  ? Calcium Carbonate-Vitamin D (RA CALCIUM PLUS VITAMIN D PO) Take 525 mg by mouth 2 (two) times daily.  ? Cholecalciferol (VITAMIN D) 50 MCG (2000 UT) CAPS Take 1 capsule by mouth daily.  ? Coenzyme Q10 (COQ-10) 100 MG CAPS Take 1 each by  mouth 2 (two) times daily.  ? DULoxetine (CYMBALTA) 60 MG capsule Take 1 capsule by mouth daily.  ? EPINEPHrine 0.3 mg/0.3 mL IJ SOAJ injection   ? fexofenadine (ALLEGRA) 180 MG tablet Take 180 mg by mouth daily.  ? fluticasone (FLONASE) 50 MCG/ACT nasal spray Place 2 sprays into both nostrils daily.  ? Glucosamine-Chondroitin (GLUCOSAMINE CHONDR COMPLEX PO) Take 3,700 mg by mouth daily.  ? ibuprofen (ADVIL) 600 MG tablet Take 600 mg by mouth every 6 (six) hours as needed.  ? Lasmiditan Succinate (REYVOW) 100 MG TABS Take by mouth.  ? montelukast (SINGULAIR) 10 MG tablet Take 1 tablet by mouth daily. Dr A.  ? naratriptan (AMERGE) 2.5 MG tablet   ? omeprazole (PRILOSEC) 20 MG capsule Take 1 capsule (20 mg total) by mouth daily.  ? polycarbophil (FIBERCON) 625 MG tablet Take 1,250 mg by mouth 2 (two) times daily.  ? Riboflavin 400 MG TABS Take 1 tablet by mouth daily.  ? rosuvastatin (CRESTOR) 10 MG tablet  TAKE 1 TABLET EVERY DAY  (DUE  FOR  BLOOD  WORK)  ? traZODone (DESYREL) 50 MG tablet TAKE 1 TO 2 TABLETS AT BEDTIME AS NEEDED FOR SLEEP  ? vitamin C (ASCORBIC ACID) 500 MG tablet Take 500 mg by mouth daily.  ? Vitamin E 180 MG CAPS Take 1 capsule by mouth daily.  ? Vitamins-Lipotropics (LIPO-FLAVONOID PLUS PO) Take 1,000 mg by mouth 2 (two) times daily.  ? Zinc 50 MG CAPS Take 1 capsule by mouth daily.  ? ? ? ?  01/06/2022  ?  8:19 AM 07/08/2021  ? 10:28 AM 07/01/2021  ? 11:06 AM 05/22/2021  ? 10:39 AM  ?GAD 7 : Generalized Anxiety Score  ?Nervous, Anxious, on Edge 0 0 0 0  ?Control/stop worrying 0 0 0 0  ?Worry too much - different things 0 0 0 0  ?Trouble relaxing 0 0 0 0  ?Restless 0 0 0 0  ?Easily annoyed or irritable 0 0 0 0  ?Afraid - awful might happen 0 0 0 0  ?Total GAD 7 Score 0 0 0 0  ?Anxiety Difficulty Not difficult at all  Not difficult at all   ? ? ? ?  01/06/2022  ?  8:19 AM  ?Depression screen PHQ 2/9  ?Decreased Interest 0  ?Down, Depressed, Hopeless 0  ?PHQ - 2 Score 0  ?Altered sleeping 0  ?Tired,  decreased energy 0  ?Change in appetite 0  ?Feeling bad or failure about yourself  0  ?Trouble concentrating 0  ?Moving slowly or fidgety/restless 0  ?Suicidal thoughts 0  ?PHQ-9 Score 0  ?Difficult doing wor

## 2022-01-07 DIAGNOSIS — J301 Allergic rhinitis due to pollen: Secondary | ICD-10-CM | POA: Diagnosis not present

## 2022-01-14 DIAGNOSIS — S93314D Dislocation of tarsal joint of right foot, subsequent encounter: Secondary | ICD-10-CM | POA: Diagnosis not present

## 2022-01-14 DIAGNOSIS — W11XXXD Fall on and from ladder, subsequent encounter: Secondary | ICD-10-CM | POA: Diagnosis not present

## 2022-01-14 DIAGNOSIS — M24571 Contracture, right ankle: Secondary | ICD-10-CM | POA: Diagnosis not present

## 2022-01-14 DIAGNOSIS — M19071 Primary osteoarthritis, right ankle and foot: Secondary | ICD-10-CM | POA: Diagnosis not present

## 2022-01-14 DIAGNOSIS — J301 Allergic rhinitis due to pollen: Secondary | ICD-10-CM | POA: Diagnosis not present

## 2022-01-14 DIAGNOSIS — M7731 Calcaneal spur, right foot: Secondary | ICD-10-CM | POA: Diagnosis not present

## 2022-01-14 DIAGNOSIS — T8484XA Pain due to internal orthopedic prosthetic devices, implants and grafts, initial encounter: Secondary | ICD-10-CM | POA: Diagnosis not present

## 2022-01-14 DIAGNOSIS — M85871 Other specified disorders of bone density and structure, right ankle and foot: Secondary | ICD-10-CM | POA: Diagnosis not present

## 2022-01-21 DIAGNOSIS — J301 Allergic rhinitis due to pollen: Secondary | ICD-10-CM | POA: Diagnosis not present

## 2022-01-24 DIAGNOSIS — J301 Allergic rhinitis due to pollen: Secondary | ICD-10-CM | POA: Diagnosis not present

## 2022-01-28 DIAGNOSIS — J301 Allergic rhinitis due to pollen: Secondary | ICD-10-CM | POA: Diagnosis not present

## 2022-01-29 DIAGNOSIS — M542 Cervicalgia: Secondary | ICD-10-CM | POA: Diagnosis not present

## 2022-02-04 DIAGNOSIS — J301 Allergic rhinitis due to pollen: Secondary | ICD-10-CM | POA: Diagnosis not present

## 2022-02-11 DIAGNOSIS — J301 Allergic rhinitis due to pollen: Secondary | ICD-10-CM | POA: Diagnosis not present

## 2022-02-18 DIAGNOSIS — J301 Allergic rhinitis due to pollen: Secondary | ICD-10-CM | POA: Diagnosis not present

## 2022-02-19 DIAGNOSIS — Z8551 Personal history of malignant neoplasm of bladder: Secondary | ICD-10-CM | POA: Diagnosis not present

## 2022-02-25 DIAGNOSIS — J301 Allergic rhinitis due to pollen: Secondary | ICD-10-CM | POA: Diagnosis not present

## 2022-03-11 DIAGNOSIS — J301 Allergic rhinitis due to pollen: Secondary | ICD-10-CM | POA: Diagnosis not present

## 2022-03-13 ENCOUNTER — Telehealth: Payer: Self-pay | Admitting: Gastroenterology

## 2022-03-13 NOTE — Telephone Encounter (Signed)
Patient called he needs a refill on his prescription of omeprazole. Patient requests a call back.

## 2022-03-14 MED ORDER — OMEPRAZOLE 20 MG PO CPDR: 20.0000 mg | DELAYED_RELEASE_CAPSULE | Freq: Every day | ORAL | 0 refills | Status: DC

## 2022-03-14 NOTE — Telephone Encounter (Signed)
Medication was sent to his pharmacy. 

## 2022-03-14 NOTE — Addendum Note (Signed)
Addended by: Adela Ports on: 03/14/2022 11:41 AM   Modules accepted: Orders

## 2022-03-18 DIAGNOSIS — J301 Allergic rhinitis due to pollen: Secondary | ICD-10-CM | POA: Diagnosis not present

## 2022-03-25 DIAGNOSIS — J301 Allergic rhinitis due to pollen: Secondary | ICD-10-CM | POA: Diagnosis not present

## 2022-03-26 DIAGNOSIS — J323 Chronic sphenoidal sinusitis: Secondary | ICD-10-CM | POA: Diagnosis not present

## 2022-03-26 DIAGNOSIS — J301 Allergic rhinitis due to pollen: Secondary | ICD-10-CM | POA: Diagnosis not present

## 2022-03-26 DIAGNOSIS — G4485 Primary stabbing headache: Secondary | ICD-10-CM | POA: Diagnosis not present

## 2022-04-01 DIAGNOSIS — J301 Allergic rhinitis due to pollen: Secondary | ICD-10-CM | POA: Diagnosis not present

## 2022-04-08 DIAGNOSIS — J301 Allergic rhinitis due to pollen: Secondary | ICD-10-CM | POA: Diagnosis not present

## 2022-04-24 ENCOUNTER — Encounter: Payer: Self-pay | Admitting: Family Medicine

## 2022-04-24 ENCOUNTER — Telehealth (INDEPENDENT_AMBULATORY_CARE_PROVIDER_SITE_OTHER): Payer: Medicare PPO | Admitting: Family Medicine

## 2022-04-24 VITALS — Ht 64.0 in

## 2022-04-24 DIAGNOSIS — U071 COVID-19: Secondary | ICD-10-CM

## 2022-04-24 MED ORDER — MOLNUPIRAVIR EUA 200MG CAPSULE: 4.0000 | ORAL_CAPSULE | Freq: Two times a day (BID) | ORAL | 0 refills | Status: AC

## 2022-04-24 NOTE — Assessment & Plan Note (Signed)
Patient presenting with several day history of symptoms, earliest onset 8/16 with 3 days of cough while he was on a cruise, concomitant sore throat during that timeframe, has more recently noted more lethargy, few episodes of diarrhea, and Tmax 99.2 yesterday.  He took a home COVID test yesterday which was positive and is no longer febrile.  His cough has been productive of thick brownish phlegm over the past few days, lethargy is still present though has improved, mild shortness of air despite his regular regimen for asthma.  Given his medical history and overall risk stratification, despite onset of symptoms, his clinical course may represent initial illness superimposed with COVID given the change in symptomatology, conversely may have been COVID the whole time but we will plan on treating with molnupiravir.  Supportive care discussed as well as need for appropriate follow-up and isolation.

## 2022-04-24 NOTE — Progress Notes (Signed)
Primary Care / Sports Medicine Virtual Visit  Patient Information:  Patient ID: Blake Patrick, male DOB: 08/08/1951 Age: 71 y.o. MRN: [id number]   Blake Patrick is a pleasant 71 y.o. male presenting with the following:  Chief Complaint  Patient presents with   Covid Positive    Positive with at home test yesterday, cough, headache, hoarseness, 99.2 fever, diarrhea, chills    Review of Systems: No fevers, chills, night sweats, weight loss, chest pain, or shortness of breath.   Patient Active Problem List   Diagnosis Date Noted   COVID 04/24/2022   Acute esophagitis    Columnar-lined esophagus    Hiatal hernia    Hyperlipidemia, mixed 07/01/2021   Insomnia disorder 07/01/2021   Environmental and seasonal allergies 07/01/2021   Esophageal dysphagia    Duodenal erythema    Esophagitis    Esophageal stenosis    Severe persistent asthma without complication 02/22/2021   Closed dislocation of tarsal joint of right foot 02/09/2021   Migraines 12/21/2017   Past Medical History:  Diagnosis Date   Allergy 06/07/20   Went for Allergy Test   Arthritis    hands, fingers   Asthma 02/22/2021   Cataract    Replaced in 2019   Colon polyp    Diverticulosis    GERD (gastroesophageal reflux disease)    occasionally   History of placement of ear tubes    2012- came out shortly after   Hyperlipidemia    Migraine    daily migraines   Sinusitis    chronic   Tendonitis    both ankles   Tinnitus    Outpatient Encounter Medications as of 04/24/2022  Medication Sig   APPLE CIDER VINEGAR PO Take 500 mg by mouth.   Calcium Carbonate-Vitamin D (RA CALCIUM PLUS VITAMIN D PO) Take 525 mg by mouth 2 (two) times daily.   Cholecalciferol (VITAMIN D) 50 MCG (2000 UT) CAPS Take 1 capsule by mouth daily.   Coenzyme Q10 (COQ-10) 100 MG CAPS Take 1 each by mouth 2 (two) times daily.   DULoxetine (CYMBALTA) 60 MG capsule Take 1 capsule by mouth daily.   EPINEPHrine 0.3 mg/0.3 mL IJ SOAJ injection     fexofenadine (ALLEGRA) 180 MG tablet Take 180 mg by mouth daily.   fluticasone (FLONASE) 50 MCG/ACT nasal spray Place 2 sprays into both nostrils daily.   Glucosamine-Chondroitin (GLUCOSAMINE CHONDR COMPLEX PO) Take 3,700 mg by mouth daily.   ibuprofen (ADVIL) 600 MG tablet Take 600 mg by mouth every 6 (six) hours as needed.   ipratropium-albuterol (DUONEB) 0.5-2.5 (3) MG/3ML SOLN Inhale into the lungs.   Lasmiditan Succinate (REYVOW) 100 MG TABS Take by mouth.   molnupiravir EUA (LAGEVRIO) 200 mg CAPS capsule Take 4 capsules (800 mg total) by mouth 2 (two) times daily for 5 days.   montelukast (SINGULAIR) 10 MG tablet Take 1 tablet by mouth daily. Dr A.   naratriptan (AMERGE) 2.5 MG tablet as needed.   omeprazole (PRILOSEC) 20 MG capsule Take 1 capsule (20 mg total) by mouth daily.   polycarbophil (FIBERCON) 625 MG tablet Take 1,250 mg by mouth 2 (two) times daily.   Riboflavin 400 MG TABS Take 1 tablet by mouth daily.   rosuvastatin (CRESTOR) 10 MG tablet Take 1 tablet daily   traZODone (DESYREL) 50 MG tablet TAKE 1 TO 2 TABLETS AT BEDTIME AS NEEDED FOR SLEEP   vitamin C (ASCORBIC ACID) 500 MG tablet Take 500 mg by mouth daily.   Vitamin  E 180 MG CAPS Take 1 capsule by mouth daily.   Vitamins-Lipotropics (LIPO-FLAVONOID PLUS PO) Take 1,000 mg by mouth 2 (two) times daily.   Zinc 50 MG CAPS Take 1 capsule by mouth daily.   budesonide (PULMICORT) 0.5 MG/2ML nebulizer solution Inhale into the lungs.   No facility-administered encounter medications on file as of 04/24/2022.   Past Surgical History:  Procedure Laterality Date   BIOPSY N/A 04/29/2021   Procedure: BIOPSY;  Surgeon: Pasty Spillers, MD;  Location: [facility];  Service: Endoscopy;  Laterality: N/A;   ELBOW SURGERY Right 2007   ESOPHAGOGASTRODUODENOSCOPY (EGD) WITH PROPOFOL N/A 04/29/2021   Procedure: ESOPHAGOGASTRODUODENOSCOPY (EGD) WITH PROPOFOL;  Surgeon: Pasty Spillers, MD;  Location: [facility];   Service: Endoscopy;  Laterality: N/A;   ESOPHAGOGASTRODUODENOSCOPY (EGD) WITH PROPOFOL N/A 07/22/2021   Procedure: ESOPHAGOGASTRODUODENOSCOPY (EGD) WITH PROPOFOL;  Surgeon: Pasty Spillers, MD;  Location: [facility];  Service: Endoscopy;  Laterality: N/A;   ETHMOIDECTOMY Bilateral 10/27/2019   Procedure: TOTAL ETHMOIDECTOMY WITH FRONTAL SINUOTOMY;  Surgeon: Vernie Murders, MD;  Location: [facility];  Service: ENT;  Laterality: Bilateral;   EYE SURGERY  2019   Replaced   HAND SURGERY  1983   HERNIA REPAIR  1995   IMAGE GUIDED SINUS SURGERY Bilateral 10/27/2019   Procedure: IMAGE GUIDED SINUS SURGERY;  Surgeon: Vernie Murders, MD;  Location: [facility];  Service: ENT;  Laterality: Bilateral;  need stryker disk placed disk on or charge nurse desk 2-17   kp   IMAGE GUIDED SINUS SURGERY Bilateral 04/26/2020   Procedure: IMAGE GUIDED SINUS SURGERY;  Surgeon: Vernie Murders, MD;  Location: [facility];  Service: ENT;  Laterality: Bilateral;  need stryker disk gave Steward Drone disk on 8-23 kp   MENISCUS REPAIR Left 2018   SEPTOPLASTY Bilateral 10/27/2019   Procedure: SEPTOPLASTY REVISION;  Surgeon: Vernie Murders, MD;  Location: [facility];  Service: ENT;  Laterality: Bilateral;   SPHENOIDECTOMY Right 04/26/2020   Procedure: Selina Cooley;  Surgeon: Vernie Murders, MD;  Location: [facility];  Service: ENT;  Laterality: Right;   TONSILLECTOMY  1964    Virtual Visit via MyChart Video:   I connected with [name] on 04/24/22 via MyChart Video and verified that I am speaking with the correct person using appropriate identifiers.   The limitations, risks, security and privacy concerns of performing an evaluation and management service by MyChart Video, including the higher likelihood of inaccurate diagnoses and treatments, and the availability of in person appointments were reviewed. The possible need of an additional face-to-face encounter for complete  and high quality delivery of care was discussed. The patient was also made aware that there may be a patient responsible charge related to this service. The patient expressed understanding and wishes to proceed.  Provider location is in medical facility. Patient location is at their home, different from provider location. People involved in care of the patient during this telehealth encounter were myself, my nurse/medical assistant, and my front office/scheduling team member.  Objective findings:   General: Speaking full sentences, no audible heavy breathing. Sounds alert and appropriately interactive. Well-appearing. Face symmetric. Extraocular movements intact. Pupils equal and round. No nasal flaring or accessory muscle use visualized.  Independent interpretation of notes and tests performed by another provider:   None  Pertinent History, Exam, Impression, and Recommendations:   COVID Patient presenting with several day history of symptoms, earliest onset 8/16 with 3 days of cough while he was on a  cruise, concomitant sore throat during that timeframe, has more recently noted more lethargy, few episodes of diarrhea, and Tmax 99.2 yesterday.  He took a home COVID test yesterday which was positive and is no longer febrile.  His cough has been productive of thick brownish phlegm over the past few days, lethargy is still present though has improved, mild shortness of air despite his regular regimen for asthma.  Given his medical history and overall risk stratification, despite onset of symptoms, his clinical course may represent initial illness superimposed with COVID given the change in symptomatology, conversely may have been COVID the whole time but we will plan on treating with molnupiravir.  Supportive care discussed as well as need for appropriate follow-up and isolation.  Orders & Medications Meds ordered this encounter  Medications   molnupiravir EUA (LAGEVRIO) 200 mg CAPS capsule     Sig: Take 4 capsules (800 mg total) by mouth 2 (two) times daily for 5 days.    Dispense:  40 capsule    Refill:  0   No orders of the defined types were placed in this encounter.    I discussed the above assessment and treatment plan with the patient. The patient was provided an opportunity to ask questions and all were answered. The patient agreed with the plan and demonstrated an understanding of the instructions.   The patient was advised to call back or seek an in-person evaluation if the symptoms worsen or if the condition fails to improve as anticipated.   I provided a total time of 30 minutes including both face-to-face and non-face-to-face time on 04/24/2022 inclusive of time utilized for medical chart review, information gathering, care coordination with staff, and documentation completion.    Montel Culver, MD   Primary Care Sports Medicine [place]

## 2022-04-24 NOTE — Patient Instructions (Signed)
-   Dose molnupiravir for full course - Review home care/isolation - Contact our office for any persistent symptoms, follow-up as needed

## 2022-04-29 DIAGNOSIS — W11XXXD Fall on and from ladder, subsequent encounter: Secondary | ICD-10-CM | POA: Diagnosis not present

## 2022-04-29 DIAGNOSIS — M19071 Primary osteoarthritis, right ankle and foot: Secondary | ICD-10-CM | POA: Diagnosis not present

## 2022-04-29 DIAGNOSIS — T8484XA Pain due to internal orthopedic prosthetic devices, implants and grafts, initial encounter: Secondary | ICD-10-CM | POA: Diagnosis not present

## 2022-04-29 DIAGNOSIS — M25674 Stiffness of right foot, not elsewhere classified: Secondary | ICD-10-CM | POA: Diagnosis not present

## 2022-04-29 DIAGNOSIS — S93314D Dislocation of tarsal joint of right foot, subsequent encounter: Secondary | ICD-10-CM | POA: Diagnosis not present

## 2022-04-29 DIAGNOSIS — J301 Allergic rhinitis due to pollen: Secondary | ICD-10-CM | POA: Diagnosis not present

## 2022-05-06 DIAGNOSIS — J301 Allergic rhinitis due to pollen: Secondary | ICD-10-CM | POA: Diagnosis not present

## 2022-05-08 DIAGNOSIS — S93314S Dislocation of tarsal joint of right foot, sequela: Secondary | ICD-10-CM | POA: Diagnosis not present

## 2022-05-08 DIAGNOSIS — M24571 Contracture, right ankle: Secondary | ICD-10-CM | POA: Diagnosis not present

## 2022-05-08 DIAGNOSIS — X58XXXA Exposure to other specified factors, initial encounter: Secondary | ICD-10-CM | POA: Diagnosis not present

## 2022-05-08 DIAGNOSIS — Z79899 Other long term (current) drug therapy: Secondary | ICD-10-CM | POA: Diagnosis not present

## 2022-05-08 DIAGNOSIS — M19071 Primary osteoarthritis, right ankle and foot: Secondary | ICD-10-CM | POA: Diagnosis not present

## 2022-05-08 DIAGNOSIS — Z87891 Personal history of nicotine dependence: Secondary | ICD-10-CM | POA: Diagnosis not present

## 2022-05-08 DIAGNOSIS — K219 Gastro-esophageal reflux disease without esophagitis: Secondary | ICD-10-CM | POA: Diagnosis not present

## 2022-05-08 DIAGNOSIS — T8484XA Pain due to internal orthopedic prosthetic devices, implants and grafts, initial encounter: Secondary | ICD-10-CM | POA: Diagnosis not present

## 2022-05-08 DIAGNOSIS — Z7951 Long term (current) use of inhaled steroids: Secondary | ICD-10-CM | POA: Diagnosis not present

## 2022-05-08 DIAGNOSIS — J45909 Unspecified asthma, uncomplicated: Secondary | ICD-10-CM | POA: Diagnosis not present

## 2022-05-08 DIAGNOSIS — G43909 Migraine, unspecified, not intractable, without status migrainosus: Secondary | ICD-10-CM | POA: Diagnosis not present

## 2022-05-08 DIAGNOSIS — G8918 Other acute postprocedural pain: Secondary | ICD-10-CM | POA: Diagnosis not present

## 2022-05-08 DIAGNOSIS — R262 Difficulty in walking, not elsewhere classified: Secondary | ICD-10-CM | POA: Diagnosis not present

## 2022-05-08 DIAGNOSIS — S93314A Dislocation of tarsal joint of right foot, initial encounter: Secondary | ICD-10-CM | POA: Diagnosis not present

## 2022-05-09 DIAGNOSIS — T8484XA Pain due to internal orthopedic prosthetic devices, implants and grafts, initial encounter: Secondary | ICD-10-CM | POA: Diagnosis not present

## 2022-05-09 DIAGNOSIS — R262 Difficulty in walking, not elsewhere classified: Secondary | ICD-10-CM | POA: Diagnosis not present

## 2022-05-09 DIAGNOSIS — M19071 Primary osteoarthritis, right ankle and foot: Secondary | ICD-10-CM | POA: Diagnosis not present

## 2022-05-09 DIAGNOSIS — M24571 Contracture, right ankle: Secondary | ICD-10-CM | POA: Diagnosis not present

## 2022-05-09 DIAGNOSIS — J45909 Unspecified asthma, uncomplicated: Secondary | ICD-10-CM | POA: Diagnosis not present

## 2022-05-09 DIAGNOSIS — Z7951 Long term (current) use of inhaled steroids: Secondary | ICD-10-CM | POA: Diagnosis not present

## 2022-05-09 DIAGNOSIS — S93314S Dislocation of tarsal joint of right foot, sequela: Secondary | ICD-10-CM | POA: Diagnosis not present

## 2022-05-09 DIAGNOSIS — Z87891 Personal history of nicotine dependence: Secondary | ICD-10-CM | POA: Diagnosis not present

## 2022-05-09 DIAGNOSIS — Z79899 Other long term (current) drug therapy: Secondary | ICD-10-CM | POA: Diagnosis not present

## 2022-05-13 DIAGNOSIS — J301 Allergic rhinitis due to pollen: Secondary | ICD-10-CM | POA: Diagnosis not present

## 2022-05-14 DIAGNOSIS — Z79899 Other long term (current) drug therapy: Secondary | ICD-10-CM | POA: Diagnosis not present

## 2022-05-14 DIAGNOSIS — G43711 Chronic migraine without aura, intractable, with status migrainosus: Secondary | ICD-10-CM | POA: Diagnosis not present

## 2022-05-14 DIAGNOSIS — Z7951 Long term (current) use of inhaled steroids: Secondary | ICD-10-CM | POA: Diagnosis not present

## 2022-05-14 DIAGNOSIS — M791 Myalgia, unspecified site: Secondary | ICD-10-CM | POA: Diagnosis not present

## 2022-05-14 DIAGNOSIS — M7918 Myalgia, other site: Secondary | ICD-10-CM | POA: Diagnosis not present

## 2022-05-14 DIAGNOSIS — F17210 Nicotine dependence, cigarettes, uncomplicated: Secondary | ICD-10-CM | POA: Diagnosis not present

## 2022-05-15 ENCOUNTER — Encounter: Payer: Self-pay | Admitting: Family Medicine

## 2022-05-20 DIAGNOSIS — J301 Allergic rhinitis due to pollen: Secondary | ICD-10-CM | POA: Diagnosis not present

## 2022-05-22 ENCOUNTER — Ambulatory Visit (INDEPENDENT_AMBULATORY_CARE_PROVIDER_SITE_OTHER): Payer: Medicare PPO

## 2022-05-22 DIAGNOSIS — Z23 Encounter for immunization: Secondary | ICD-10-CM | POA: Diagnosis not present

## 2022-05-25 IMAGING — CR DG FOOT COMPLETE 3+V*R*
1 series · 3 of 3 positions shown · non-contrast
Comparison: None.

CLINICAL DATA: Pain following fall

EXAM:
RIGHT FOOT COMPLETE - 3+ VIEW

[Series 1: dg foot complete right · 0.14mm/px · 3 of 3 slices shown]
[im 1/3]
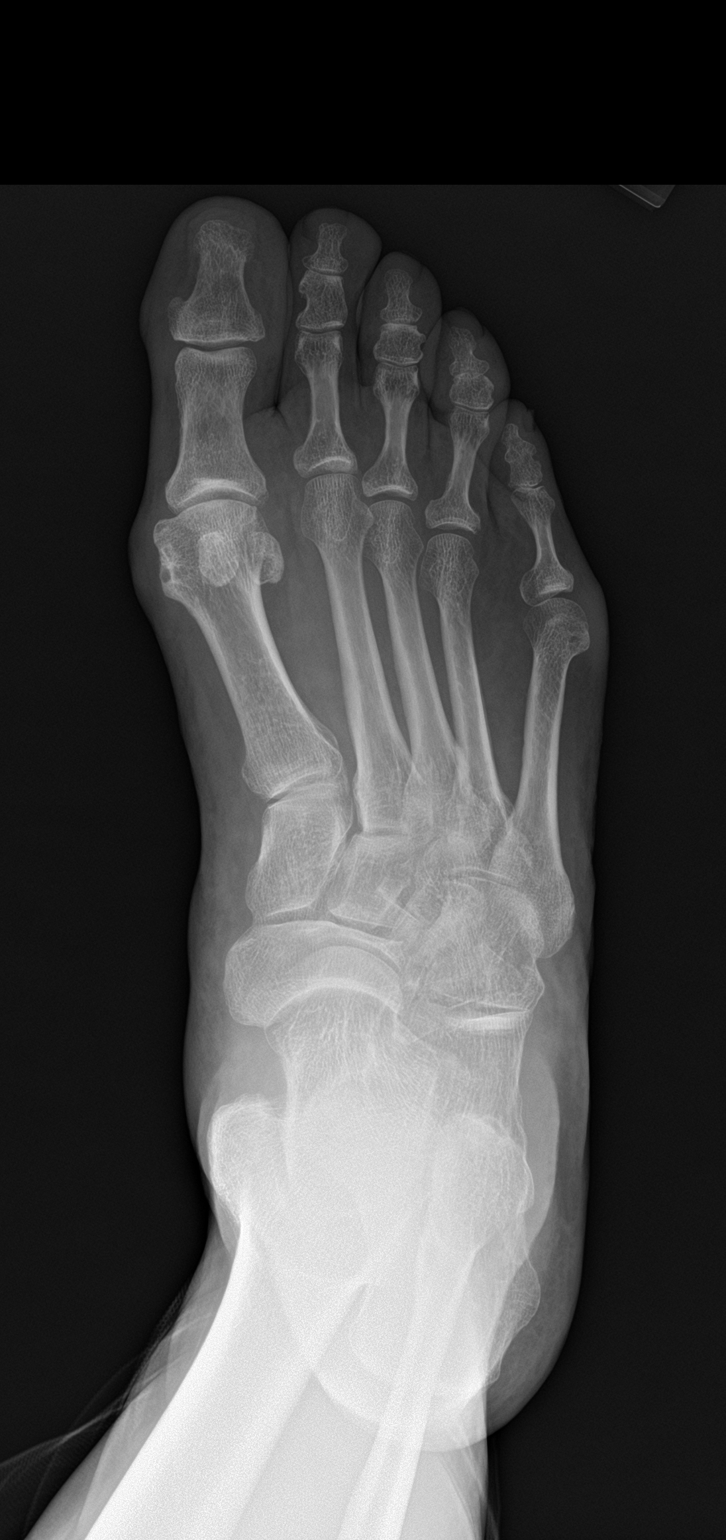
[im 2/3]
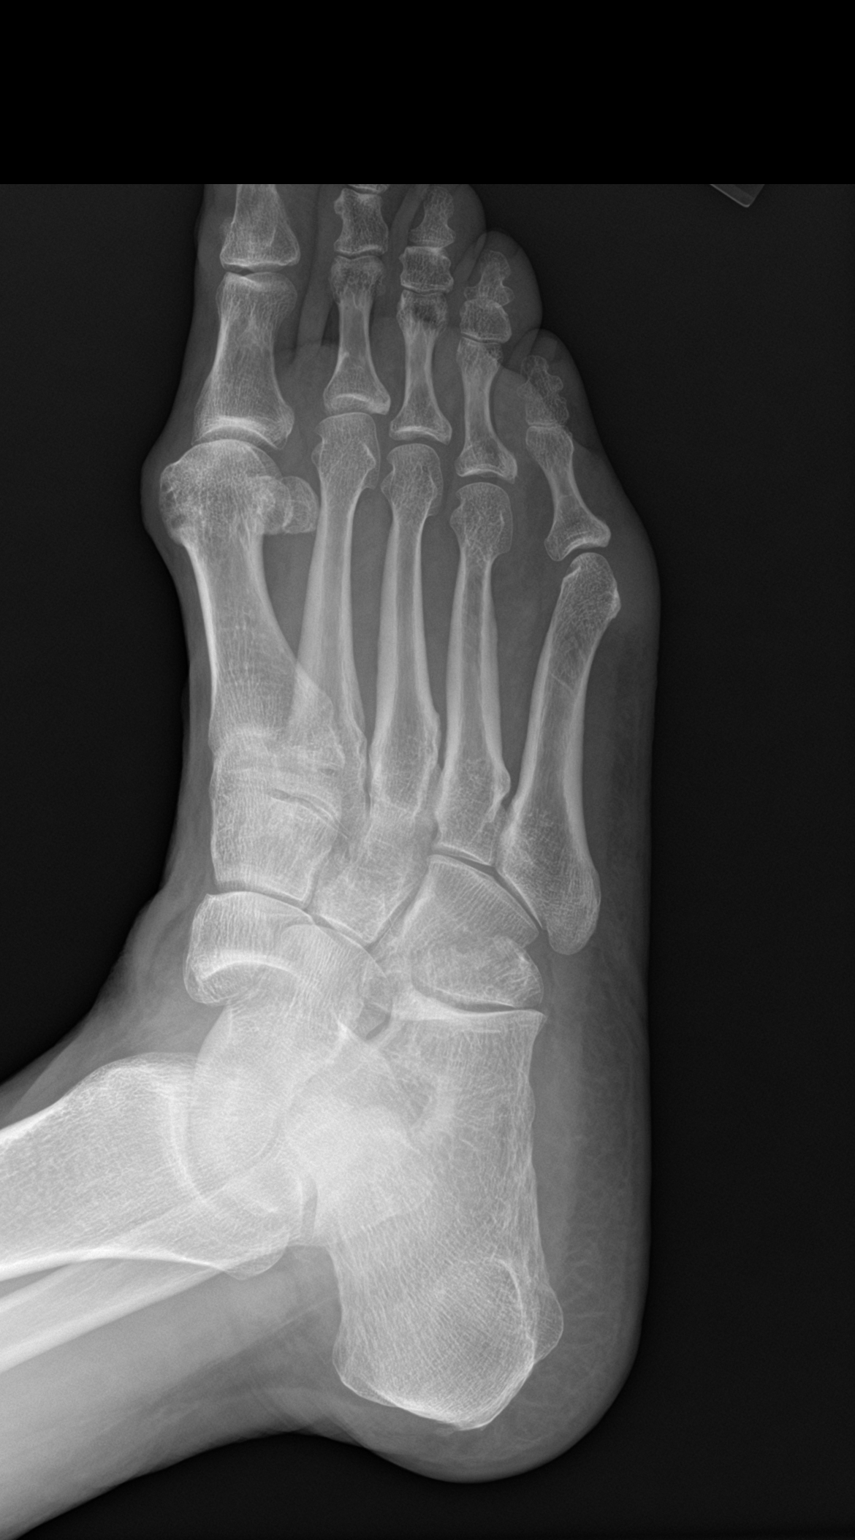
[im 3/3]
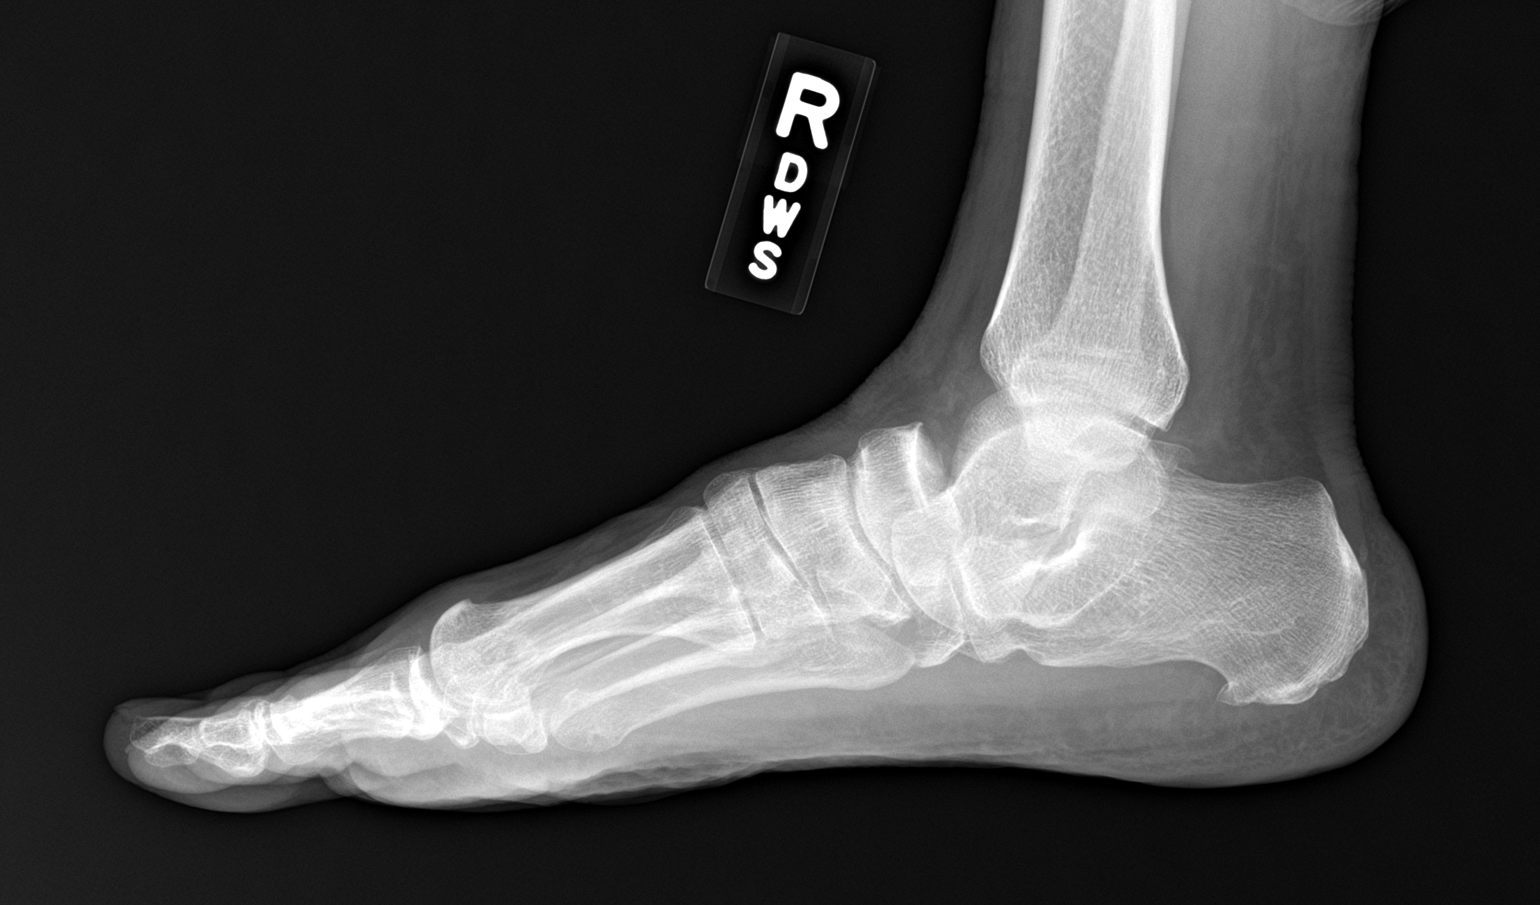

[3 of 3 positions shown; findings below may reference images not displayed]

FINDINGS: Frontal, oblique and lateral views were obtained. There is apparent
dislocation at the talonavicular joint with the navicular displaced
superior to the talus. There is an apparent fracture of the
calcaneus with impaction at the fracture site. No other fracture. No
dislocation. There is narrowing of the first MTP joint with bony
overgrowth distal first metatarsal. Other joint spaces appear
unremarkable.
IMPRESSION: Apparent talonavicular joint dislocation with the navicular is
slightly superiorly with respect to the talus. Apparent fracture of
the cuboid with apparent impaction in this area. No other fracture
or dislocation evident. Narrowing first MTP joint bony overgrowth
distal first metatarsal. Other joint spaces appear.

CT of the foot may be helpful for further delineation of this
somewhat complex appearing injury.

## 2022-05-25 IMAGING — CR DG FOOT COMPLETE 3+V*L*
1 series · 3 of 3 positions shown · non-contrast
Comparison: None.

CLINICAL DATA: Pain following fall

EXAM:
LEFT FOOT - COMPLETE 3+ VIEW

[Series 1: dg foot complete left · 0.14mm/px · 3 of 3 slices shown]
[im 1/3]
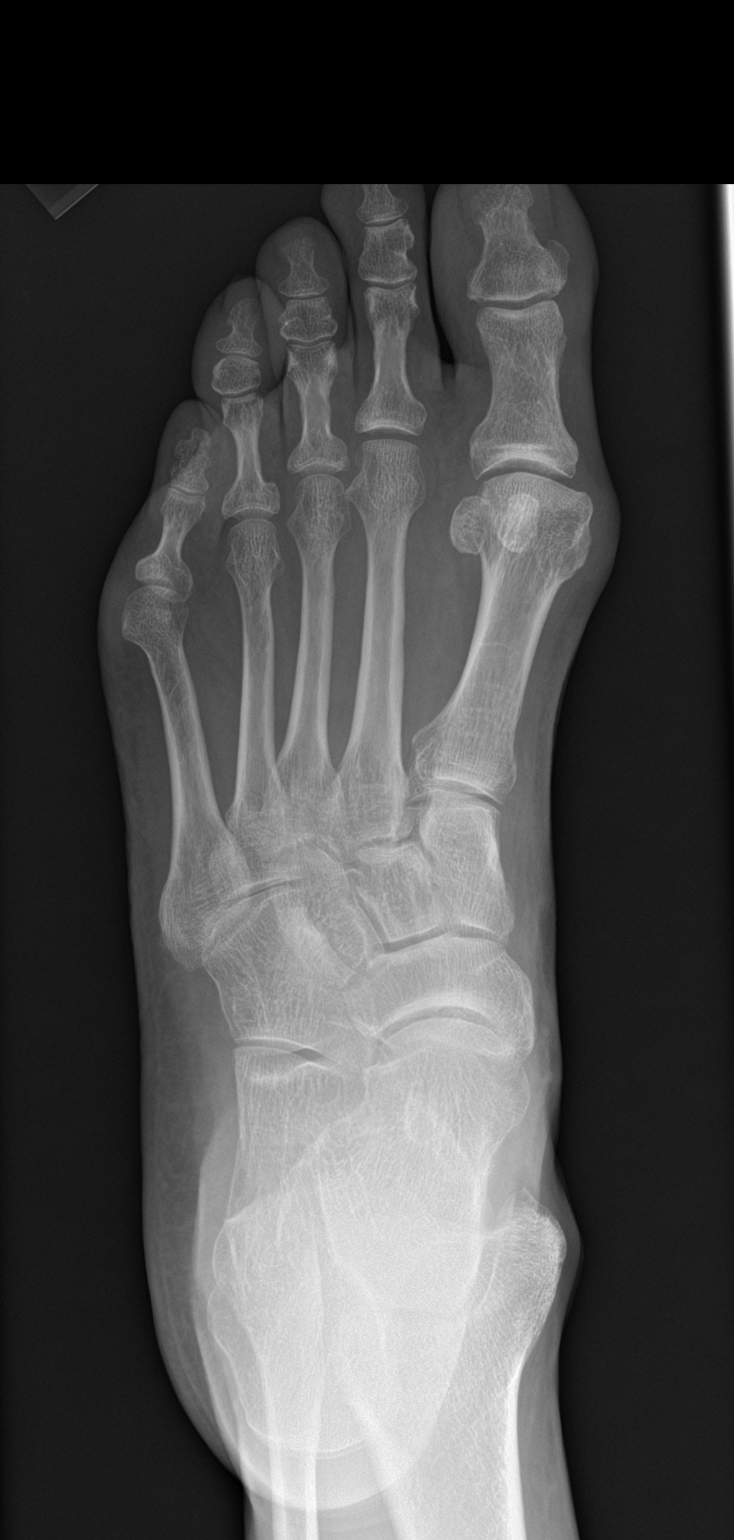
[im 2/3]
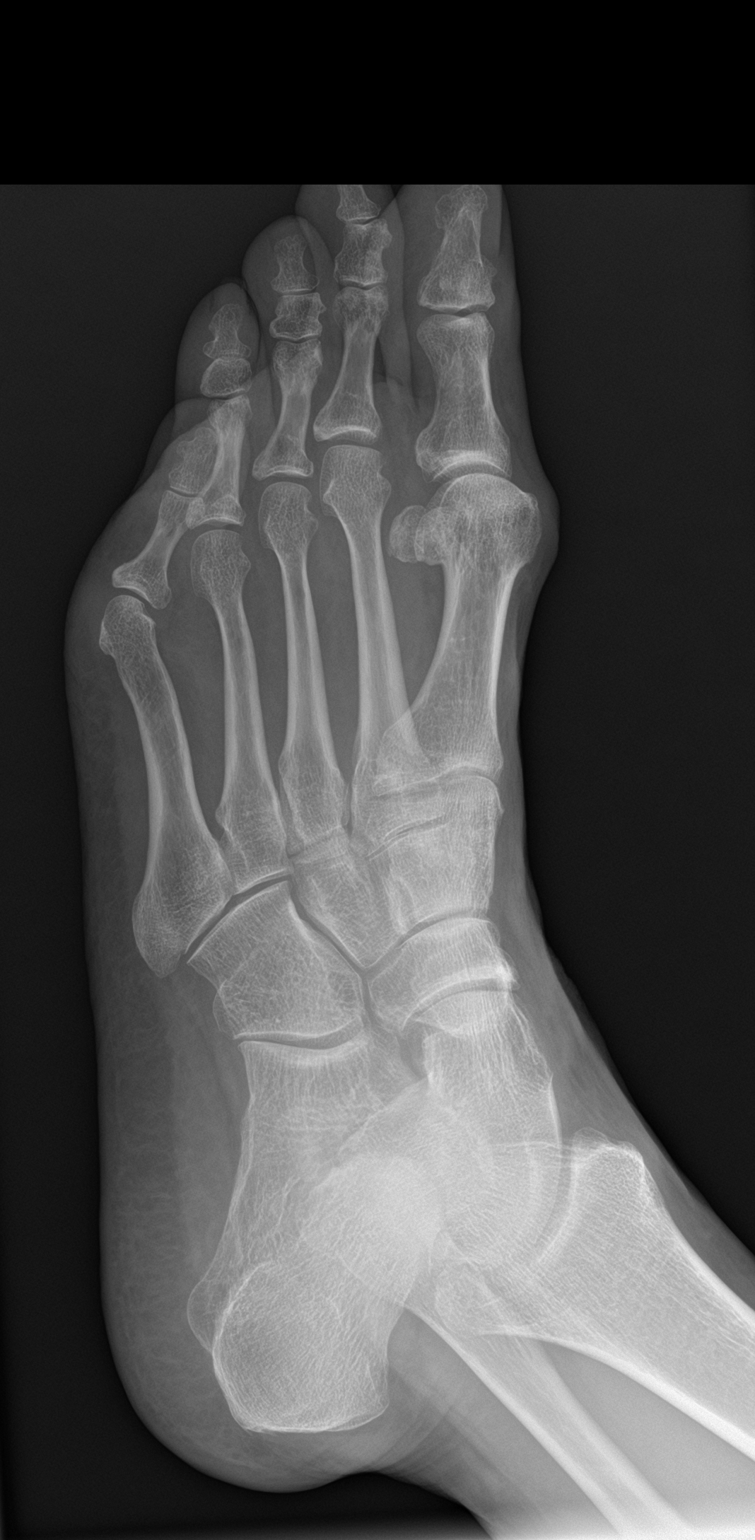
[im 3/3]
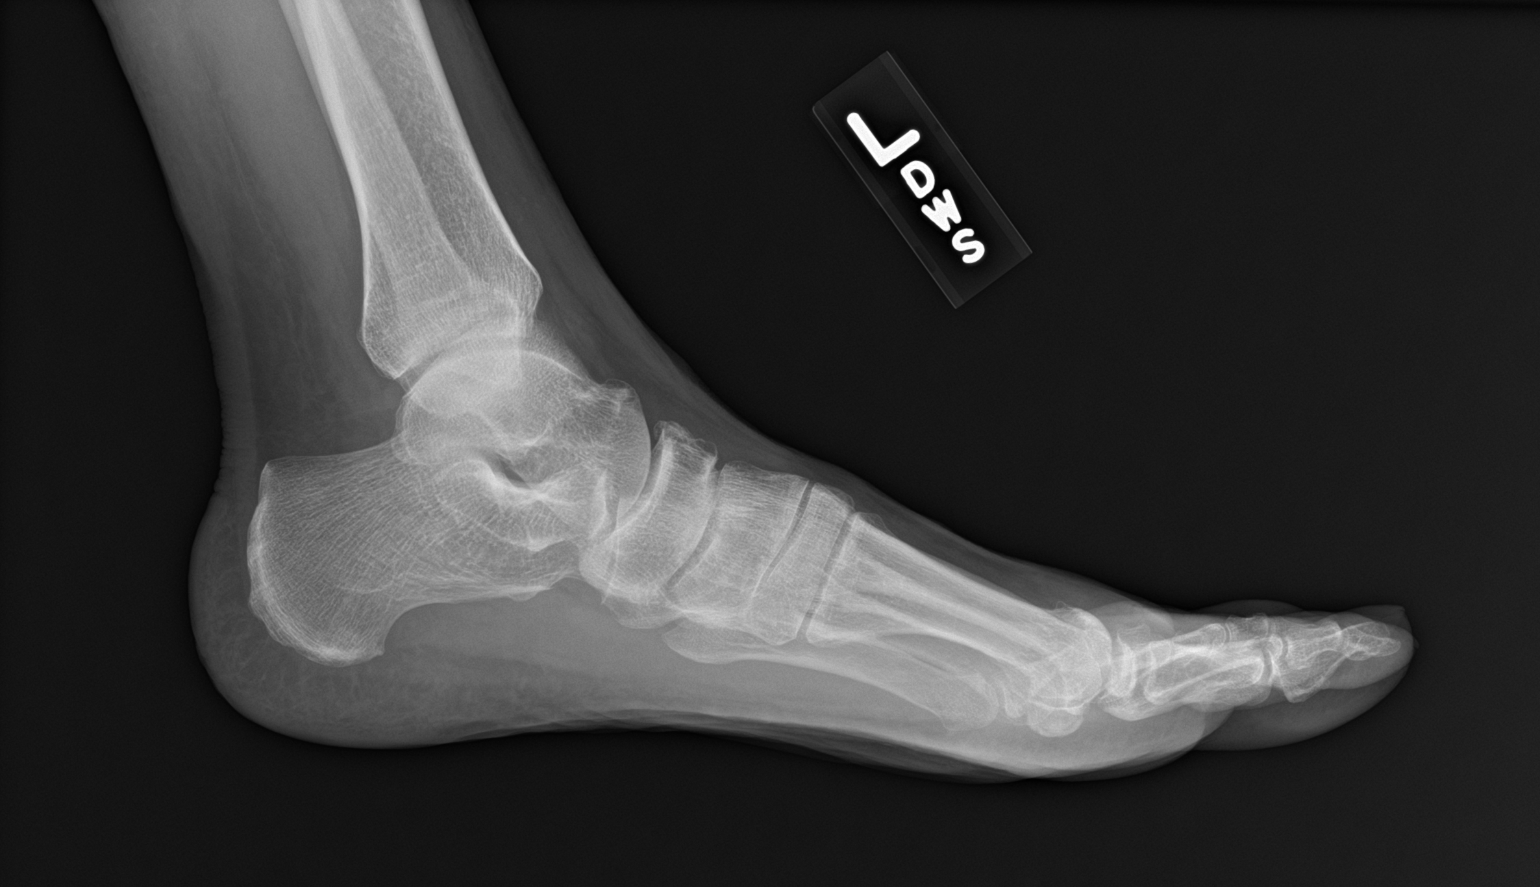

[3 of 3 positions shown; findings below may reference images not displayed]

FINDINGS: Frontal, oblique, and lateral views were obtained. No evident
fracture or dislocation. Joint spaces appear normal. No erosive
change.
IMPRESSION: No fracture or dislocation.  No evident arthropathy.

## 2022-05-27 DIAGNOSIS — J301 Allergic rhinitis due to pollen: Secondary | ICD-10-CM | POA: Diagnosis not present

## 2022-05-29 ENCOUNTER — Other Ambulatory Visit: Payer: Self-pay | Admitting: Gastroenterology

## 2022-05-29 ENCOUNTER — Other Ambulatory Visit: Payer: Self-pay | Admitting: Family Medicine

## 2022-05-29 DIAGNOSIS — F5101 Primary insomnia: Secondary | ICD-10-CM

## 2022-05-29 NOTE — Telephone Encounter (Signed)
Requested Prescriptions  Pending Prescriptions Disp Refills  . traZODone (DESYREL) 50 MG tablet [Pharmacy Med Name: TRAZODONE HYDROCHLORIDE 50 MG Tablet] 180 tablet 0    Sig: TAKE 1 TO 2 TABLETS AT BEDTIME AS NEEDED FOR SLEEP     Psychiatry: Antidepressants - Serotonin Modulator Passed - 05/29/2022  2:43 AM      Passed - Valid encounter within last 6 months    Recent Outpatient Visits          1 month ago [facility], Earley Abide, MD   4 months ago Hyperlipidemia, unspecified hyperlipidemia type   [facility] at [place], [place], MD   6 months ago TMJ click   [facility] [place], [place], MD   8 months ago Acute left-sided low back pain without sciatica   [facility] [place], [place], MD   10 months ago Hyperlipidemia, unspecified hyperlipidemia type   [facility] at [place], [place], MD      Future Appointments            In 1 month Juline Patch, MD [facility] at [facility], [facility]   In 1 month Jonathon Bellows, [place]

## 2022-06-03 DIAGNOSIS — J301 Allergic rhinitis due to pollen: Secondary | ICD-10-CM | POA: Diagnosis not present

## 2022-06-06 ENCOUNTER — Encounter: Payer: Self-pay | Admitting: Family Medicine

## 2022-06-08 ENCOUNTER — Encounter: Payer: Self-pay | Admitting: Family Medicine

## 2022-06-09 DIAGNOSIS — Z859 Personal history of malignant neoplasm, unspecified: Secondary | ICD-10-CM | POA: Diagnosis not present

## 2022-06-09 DIAGNOSIS — L570 Actinic keratosis: Secondary | ICD-10-CM | POA: Diagnosis not present

## 2022-06-09 DIAGNOSIS — L905 Scar conditions and fibrosis of skin: Secondary | ICD-10-CM | POA: Diagnosis not present

## 2022-06-09 DIAGNOSIS — L578 Other skin changes due to chronic exposure to nonionizing radiation: Secondary | ICD-10-CM | POA: Diagnosis not present

## 2022-06-09 DIAGNOSIS — Z872 Personal history of diseases of the skin and subcutaneous tissue: Secondary | ICD-10-CM | POA: Diagnosis not present

## 2022-06-10 DIAGNOSIS — J301 Allergic rhinitis due to pollen: Secondary | ICD-10-CM | POA: Diagnosis not present

## 2022-06-17 DIAGNOSIS — M19071 Primary osteoarthritis, right ankle and foot: Secondary | ICD-10-CM | POA: Diagnosis not present

## 2022-06-17 DIAGNOSIS — M7731 Calcaneal spur, right foot: Secondary | ICD-10-CM | POA: Diagnosis not present

## 2022-06-17 DIAGNOSIS — J301 Allergic rhinitis due to pollen: Secondary | ICD-10-CM | POA: Diagnosis not present

## 2022-06-17 DIAGNOSIS — Z981 Arthrodesis status: Secondary | ICD-10-CM | POA: Diagnosis not present

## 2022-06-24 DIAGNOSIS — J301 Allergic rhinitis due to pollen: Secondary | ICD-10-CM | POA: Diagnosis not present

## 2022-06-25 ENCOUNTER — Ambulatory Visit (INDEPENDENT_AMBULATORY_CARE_PROVIDER_SITE_OTHER): Payer: Medicare PPO

## 2022-06-25 VITALS — BP 118/78 | HR 89 | Ht 64.0 in | Wt 154.4 lb

## 2022-06-25 DIAGNOSIS — Z23 Encounter for immunization: Secondary | ICD-10-CM | POA: Diagnosis not present

## 2022-06-25 DIAGNOSIS — Z0000 Encounter for general adult medical examination without abnormal findings: Secondary | ICD-10-CM

## 2022-06-25 NOTE — Progress Notes (Signed)
Subjective:   Blake Patrick is a 71 y.o. male who presents for Medicare Annual/Subsequent preventive examination.  I connected with  Blake Patrick on 06/25/22 by an in person visit and verified that I am speaking with the correct person using two identifiers.  Patient Location: Other:  In Office  Provider Location: Office/Clinic   Review of Systems    Defer to PCP Cardiac Risk Factors include: advanced age (>42men, >19 women)     Objective:    Today's Vitals   06/25/22 0811 06/25/22 0849  BP: 118/78   Pulse: 89   SpO2: 95%   Weight: 154 lb 6.4 oz (70 kg)   Height: 5\' 4"  (1.626 m)   PainSc: 0-No pain 0-No pain   Body mass index is 26.5 kg/m.     06/25/2022    8:51 AM 06/24/2021    8:58 AM 04/29/2021   12:54 PM 02/08/2021    2:04 PM 06/20/2020    9:12 AM 04/26/2020    6:52 AM 10/27/2019    8:43 AM  Advanced Directives  Does Patient Have a Medical Advance Directive? Yes No Yes Yes Yes Yes Yes  Type of Advance Directive Living will;Healthcare Power of [place];Living will Living will [place];Living will [place];Living will [place];Living will  Does patient want to make changes to medical advance directive?   No - Patient declined    No - Patient declined  Copy of [place] in Chart? Yes - validated most recent copy scanned in chart (See row information)  No - copy requested  No - copy requested No - copy requested No - copy requested  Would patient like information on creating a medical advance directive?  Yes (MAU/Ambulatory/Procedural Areas - Information given)         Current Medications (verified) Outpatient Encounter Medications as of 06/25/2022  Medication Sig   APPLE CIDER VINEGAR PO Take 500 mg by mouth.   budesonide (PULMICORT) 0.5 MG/2ML nebulizer solution Inhale into the lungs.   Calcium Carbonate-Vitamin D (RA CALCIUM PLUS VITAMIN D PO) Take 525 mg by  mouth 2 (two) times daily.   Cholecalciferol (VITAMIN D) 50 MCG (2000 UT) CAPS Take 1 capsule by mouth daily.   Coenzyme Q10 (COQ-10) 100 MG CAPS Take 1 each by mouth 2 (two) times daily.   EPINEPHrine 0.3 mg/0.3 mL IJ SOAJ injection    fexofenadine (ALLEGRA) 180 MG tablet Take 180 mg by mouth daily.   fluticasone (FLONASE) 50 MCG/ACT nasal spray Place 2 sprays into both nostrils daily.   Glucosamine-Chondroitin (GLUCOSAMINE CHONDR COMPLEX PO) Take 3,700 mg by mouth daily.   ibuprofen (ADVIL) 600 MG tablet Take 600 mg by mouth every 6 (six) hours as needed.   Lasmiditan Succinate (REYVOW) 100 MG TABS Take by mouth.   naratriptan (AMERGE) 2.5 MG tablet as needed.   polycarbophil (FIBERCON) 625 MG tablet Take 1,250 mg by mouth 2 (two) times daily.   Riboflavin 400 MG TABS Take 1 tablet by mouth daily.   rosuvastatin (CRESTOR) 10 MG tablet Take 1 tablet daily   traZODone (DESYREL) 50 MG tablet TAKE 1 TO 2 TABLETS AT BEDTIME AS NEEDED FOR SLEEP   vitamin C (ASCORBIC ACID) 500 MG tablet Take 500 mg by mouth daily.   Vitamin E 180 MG CAPS Take 1 capsule by mouth daily.   Vitamins-Lipotropics (LIPO-FLAVONOID PLUS PO) Take 1,000 mg by mouth 2 (two) times daily.   Zinc  50 MG CAPS Take 1 capsule by mouth daily.   ipratropium-albuterol (DUONEB) 0.5-2.5 (3) MG/3ML SOLN Inhale into the lungs.   montelukast (SINGULAIR) 10 MG tablet Take 1 tablet by mouth daily. Dr A.   omeprazole (PRILOSEC) 20 MG capsule Take 1 capsule (20 mg total) by mouth daily.   No facility-administered encounter medications on file as of 06/25/2022.    Allergies (verified) Patient has no known allergies.   History: Past Medical History:  Diagnosis Date   Allergy 06/07/20   Went for Allergy Test   Arthritis    hands, fingers   Asthma 02/22/2021   Cataract    Replaced in 2019   Colon polyp    Diverticulosis    GERD (gastroesophageal reflux disease)    occasionally   History of placement of ear tubes    2012- came out  shortly after   Hyperlipidemia    Migraine    daily migraines   Sinusitis    chronic   Tendonitis    both ankles   Tinnitus    Past Surgical History:  Procedure Laterality Date   BIOPSY N/A 04/29/2021   Procedure: BIOPSY;  Surgeon: Pasty Spillers, MD;  Location: [facility];  Service: Endoscopy;  Laterality: N/A;   ELBOW SURGERY Right 2007   ESOPHAGOGASTRODUODENOSCOPY (EGD) WITH PROPOFOL N/A 04/29/2021   Procedure: ESOPHAGOGASTRODUODENOSCOPY (EGD) WITH PROPOFOL;  Surgeon: Pasty Spillers, MD;  Location: [facility];  Service: Endoscopy;  Laterality: N/A;   ESOPHAGOGASTRODUODENOSCOPY (EGD) WITH PROPOFOL N/A 07/22/2021   Procedure: ESOPHAGOGASTRODUODENOSCOPY (EGD) WITH PROPOFOL;  Surgeon: Pasty Spillers, MD;  Location: [facility];  Service: Endoscopy;  Laterality: N/A;   ETHMOIDECTOMY Bilateral 10/27/2019   Procedure: TOTAL ETHMOIDECTOMY WITH FRONTAL SINUOTOMY;  Surgeon: Vernie Murders, MD;  Location: [facility];  Service: ENT;  Laterality: Bilateral;   EYE SURGERY  2019   Replaced   HAND SURGERY  1983   HERNIA REPAIR  1995   IMAGE GUIDED SINUS SURGERY Bilateral 10/27/2019   Procedure: IMAGE GUIDED SINUS SURGERY;  Surgeon: Vernie Murders, MD;  Location: [facility];  Service: ENT;  Laterality: Bilateral;  need stryker disk placed disk on or charge nurse desk 2-17   kp   IMAGE GUIDED SINUS SURGERY Bilateral 04/26/2020   Procedure: IMAGE GUIDED SINUS SURGERY;  Surgeon: Vernie Murders, MD;  Location: [facility];  Service: ENT;  Laterality: Bilateral;  need stryker disk gave Steward Drone disk on 8-23 kp   MENISCUS REPAIR Left 2018   SEPTOPLASTY Bilateral 10/27/2019   Procedure: SEPTOPLASTY REVISION;  Surgeon: Vernie Murders, MD;  Location: [facility];  Service: ENT;  Laterality: Bilateral;   SPHENOIDECTOMY Right 04/26/2020   Procedure: Selina Cooley;  Surgeon: Vernie Murders, MD;  Location: [facility];  Service: ENT;   Laterality: Right;   TONSILLECTOMY  1964   Family History  Problem Relation Age of Onset   Cancer Father    Social History   Socioeconomic History   Marital status: Married    Spouse name: Not on file   Number of children: 1   Years of education: Not on file   Highest education level: Not on file  Occupational History   Not on file  Tobacco Use   Smoking status: Former    Packs/day: 1.00    Years: 26.00    Total pack years: 26.00    Types: Cigarettes    Quit date: 09/01/1988    Years since quitting: 33.8   Smokeless tobacco: Never  Vaping Use   Vaping Use: Never used  Substance and Sexual Activity   Alcohol use: Yes    Alcohol/week: 7.0 standard drinks of alcohol    Types: 7 Cans of beer per week    Comment: Social drinking only   Drug use: Never   Sexual activity: Yes  Other Topics Concern   Not on file  Social History Narrative   ** Merged History Encounter **       Social Determinants of Health   Financial Resource Strain: Low Risk  (06/25/2022)   Overall Financial Resource Strain (CARDIA)    Difficulty of Paying Living Expenses: Not hard at all  Food Insecurity: No Food Insecurity (04/24/2022)   Hunger Vital Sign    Worried About Running Out of Food in the Last Year: Never true    Ran Out of Food in the Last Year: Never true  Transportation Needs: No Transportation Needs (04/24/2022)   PRAPARE - Administrator, Civil Service (Medical): No    Lack of Transportation (Non-Medical): No  Physical Activity: Inactive (06/25/2022)   Exercise Vital Sign    Days of Exercise per Week: 0 days    Minutes of Exercise per Session: 0 min  Stress: No Stress Concern Present (06/25/2022)   [employer] of Occupational Health - Occupational Stress Questionnaire    Feeling of Stress : Not at all  Social Connections: Moderately Isolated (06/25/2022)   Social Connection and Isolation Panel [NHANES]    Frequency of Communication with Friends and Family: More  than three times a week    Frequency of Social Gatherings with Friends and Family: Twice a week    Attends Religious Services: Never    Database administrator or Organizations: No    Attends Engineer, structural: Never    Marital Status: Married    Tobacco Counseling Counseling given: Not Answered   Clinical Intake:  Pre-visit preparation completed: Yes  Pain : No/denies pain Pain Score: 0-No pain     BMI - recorded: 26.5 Nutritional Status: BMI 25 -29 Overweight Diabetes: No     Diabetic? No.     Information entered by :: Margaretha Sheffield, CMA   Activities of Daily Living    06/25/2022    8:52 AM  In your present state of health, do you have any difficulty performing the following activities:  Hearing? 0  Vision? 0  Difficulty concentrating or making decisions? 0  Walking or climbing stairs? 0  Dressing or bathing? 0  Doing errands, shopping? 0  Preparing Food and eating ? N  Using the Toilet? N  In the past six months, have you accidently leaked urine? N  Do you have problems with loss of bowel control? N  Managing your Medications? N  Managing your Finances? N  Housekeeping or managing your Housekeeping? N    Patient Care Team: Duanne Limerick, MD as PCP - General (Family Medicine) Vida Rigger, MD as Consulting Physician (Pulmonary Disease) Pasty Spillers, MD (Inactive) as Consulting Physician (Gastroenterology) Alver Fisher, MD as Referring Physician (Neurology) Marin Olp, MD as Referring Physician (Urology) Vernie Murders, MD (Otolaryngology)  Indicate any recent Medical Services you may have received from other than [facility] providers in the past year (date may be approximate).     Assessment:   This is a routine wellness examination for Blake Patrick.  Hearing/Vision screen Hearing Screening - Comments:: Tinnitus, Slight Hearing Loss.  Vision Screening - Comments:: Wears prescription glasses.  Dietary issues and  exercise  activities discussed: Current Exercise Habits: The patient does not participate in regular exercise at present   Goals Addressed   None   Depression Screen    06/25/2022    8:50 AM 04/24/2022    8:12 AM 01/06/2022    8:19 AM 07/08/2021   10:28 AM 07/01/2021   11:06 AM 06/24/2021    8:57 AM 05/22/2021   10:39 AM  PHQ 2/9 Scores  PHQ - 2 Score 0 0 0 0 0 0 0  PHQ- 9 Score 1 2 0 0 0 1 5    Fall Risk    06/25/2022    8:52 AM 04/24/2022    8:13 AM 07/01/2021   11:06 AM 06/24/2021    8:58 AM 06/20/2020    9:11 AM  Fall Risk   Falls in the past year? 0 0 0 1 0  Number falls in past yr: 0 0 0 0 0  Injury with Fall? 0 0 0 1 0  Risk for fall due to : No Fall Risks No Fall Risks No Fall Risks Orthopedic patient No Fall Risks  Follow up Falls evaluation completed Falls evaluation completed Falls evaluation completed Falls prevention discussed Falls prevention discussed    FALL RISK PREVENTION PERTAINING TO THE HOME:  Any stairs in or around the home? Yes  If so, are there any without handrails? Yes  Home free of loose throw rugs in walkways, pet beds, electrical cords, etc? Yes  Adequate lighting in your home to reduce risk of falls? Yes   ASSISTIVE DEVICES UTILIZED TO PREVENT FALLS:  Life alert? No  Use of a cane, walker or w/c? No  Grab bars in the bathroom? No  Shower chair or bench in shower? Yes  Elevated toilet seat or a handicapped toilet? No   TIMED UP AND GO:  Was the test performed? Yes .  Gait steady and fast with assistive device  Cognitive Function:        06/25/2022    8:54 AM 06/20/2020    9:12 AM  6CIT Screen  What Year? 0 points 0 points  What month? 0 points 0 points  What time? 0 points 0 points  Count back from 20 0 points 0 points  Months in reverse 0 points 0 points  Repeat phrase 2 points 0 points  Total Score 2 points 0 points    Immunizations Immunization History  Administered Date(s) Administered   COVID-19, mRNA, vaccine(Comirnaty)12  years and older 06/25/2022   Fluad Quad(high Dose 65+) 07/07/2019, 06/14/2020, 05/22/2021, 05/22/2022   PFIZER Comirnaty(Gray Top)Covid-19 Tri-Sucrose Vaccine 10/07/2019, 11/01/2019, 04/08/2021   PFIZER(Purple Top)SARS-COV-2 Vaccination 10/07/2019, 11/01/2019, 06/25/2020, 06/25/2020   Pfizer Covid-19 Vaccine Bivalent Booster 3750yrs & up 10/18/2021   Pneumococcal Conjugate-13 12/24/2017   Pneumococcal Polysaccharide-23 11/22/2019   Respiratory Syncytial Virus Vaccine,Recomb Aduvanted(Arexvy) 06/07/2022   Zoster Recombinat (Shingrix) 09/17/2021, 06/04/2022   Zoster, Live 09/01/2010    TDAP status: Due, Education has been provided regarding the importance of this vaccine. Advised may receive this vaccine at local pharmacy or Health Dept. Aware to provide a copy of the vaccination record if obtained from local pharmacy or Health Dept. Verbalized acceptance and understanding.  Flu Vaccine status: Up to date  Pneumococcal vaccine status: Up to date  Covid-19 vaccine status: Completed vaccines  Qualifies for Shingles Vaccine? Yes   Zostavax completed No   Shingrix Completed?: Yes  Screening Tests Health Maintenance  Topic Date Due   TETANUS/TDAP  Never done   Medicare Annual Wellness (  AWV)  07/26/2023   COLONOSCOPY (Pts 45-86yrs Insurance coverage will need to be confirmed)  03/08/2024   Pneumonia Vaccine 56+ Years old  Completed   INFLUENZA VACCINE  Completed   COVID-19 Vaccine  Completed   Hepatitis C Screening  Completed   Zoster Vaccines- Shingrix  Completed   HPV VACCINES  Aged Out    Health Maintenance  Health Maintenance Due  Topic Date Due   TETANUS/TDAP  Never done    Colorectal cancer screening: Type of screening: Colonoscopy. Completed 03/09/2019. Repeat every 5 years  Lung Cancer Screening: (Low Dose CT Chest recommended if Age 70-80 years, 30 pack-year currently smoking OR have quit w/in 15years.) does not qualify.   Lung Cancer Screening Referral:  N/A  Additional Screening:  Hepatitis C Screening: does qualify; Completed 11/03/2018  Vision Screening: Recommended annual ophthalmology exams for early detection of glaucoma and other disorders of the eye. Is the patient up to date with their annual eye exam?  Yes  Who is the provider or what is the name of the office in which the patient attends annual eye exams? [facility] If pt is not established with a provider, would they like to be referred to a provider to establish care?  N/A .   Dental Screening: Recommended annual dental exams for proper oral hygiene  Community Resource Referral / Chronic Care Management: CRR required this visit?  No   CCM required this visit?  No      Plan:     I have personally reviewed and noted the following in the patient's chart:   Medical and social history Use of alcohol, tobacco or illicit drugs  Current medications and supplements including opioid prescriptions. Patient is not currently taking opioid prescriptions. Functional ability and status Nutritional status Physical activity Advanced directives List of other physicians Hospitalizations, surgeries, and ER visits in previous 12 months Vitals Screenings to include cognitive, depression, and falls Referrals and appointments  In addition, I have reviewed and discussed with patient certain preventive protocols, quality metrics, and best practice recommendations. A written personalized care plan for preventive services as well as general preventive health recommendations were provided to patient.     Mariel Sleet, CMA   06/25/2022    Blake Patrick , Thank you for taking time to come for your Medicare Wellness Visit. I appreciate your ongoing commitment to your health goals. Please review the following plan we discussed and let me know if I can assist you in the future.   These are the goals we discussed:  Goals      Increase physical activity     Recommend increasing  physical activity to at least 3 days per week        This is a list of the screening recommended for you and due dates:  Health Maintenance  Topic Date Due   Tetanus Vaccine  Never done   Medicare Annual Wellness Visit  07/26/2023   Colon Cancer Screening  03/08/2024   Pneumonia Vaccine  Completed   Flu Shot  Completed   COVID-19 Vaccine  Completed   Hepatitis C Screening: USPSTF Recommendation to screen - Ages 108-79 yo.  Completed   Zoster (Shingles) Vaccine  Completed   HPV Vaccine  Aged Out      Nurse Notes: None.

## 2022-06-26 DIAGNOSIS — J4540 Moderate persistent asthma, uncomplicated: Secondary | ICD-10-CM | POA: Diagnosis not present

## 2022-06-26 DIAGNOSIS — J4550 Severe persistent asthma, uncomplicated: Secondary | ICD-10-CM | POA: Diagnosis not present

## 2022-07-01 DIAGNOSIS — J301 Allergic rhinitis due to pollen: Secondary | ICD-10-CM | POA: Diagnosis not present

## 2022-07-07 DIAGNOSIS — J301 Allergic rhinitis due to pollen: Secondary | ICD-10-CM | POA: Diagnosis not present

## 2022-07-08 DIAGNOSIS — J301 Allergic rhinitis due to pollen: Secondary | ICD-10-CM | POA: Diagnosis not present

## 2022-07-09 ENCOUNTER — Encounter: Payer: Self-pay | Admitting: Family Medicine

## 2022-07-09 ENCOUNTER — Ambulatory Visit (INDEPENDENT_AMBULATORY_CARE_PROVIDER_SITE_OTHER): Payer: Medicare PPO | Admitting: Family Medicine

## 2022-07-09 VITALS — BP 128/78 | HR 90 | Ht 65.0 in | Wt 154.0 lb

## 2022-07-09 DIAGNOSIS — F5101 Primary insomnia: Secondary | ICD-10-CM

## 2022-07-09 DIAGNOSIS — E785 Hyperlipidemia, unspecified: Secondary | ICD-10-CM

## 2022-07-09 DIAGNOSIS — R69 Illness, unspecified: Secondary | ICD-10-CM

## 2022-07-09 MED ORDER — TRAZODONE HCL 50 MG PO TABS: ORAL_TABLET | ORAL | 1 refills | Status: DC

## 2022-07-09 MED ORDER — ROSUVASTATIN CALCIUM 10 MG PO TABS: ORAL_TABLET | ORAL | 1 refills | Status: DC

## 2022-07-09 NOTE — Progress Notes (Signed)
Date:  07/09/2022   Name:  Blake Patrick   DOB:  [birth year]   MRN:  [id number]   Chief Complaint: Insomnia and Hyperlipidemia  Insomnia Primary symptoms: no fragmented sleep, no sleep disturbance, no difficulty falling asleep, no somnolence, no frequent awakening, no premature morning awakening, no malaise/fatigue, no napping.   The current episode started more than one month. The onset quality is sudden. The problem has been gradually improving since onset. Past treatments include medication. The treatment provided moderate relief.  Hyperlipidemia This is a chronic problem. The problem is controlled. Recent lipid tests were reviewed and are normal. Pertinent negatives include no chest pain, focal sensory loss, focal weakness, leg pain, myalgias or shortness of breath. Current antihyperlipidemic treatment includes statins. The current treatment provides moderate improvement of lipids. There are no compliance problems.  Risk factors for coronary artery disease include dyslipidemia and hypertension.    Lab Results  Component Value Date   NA 135 04/02/2021   K 4.1 04/02/2021   CO2 22 04/02/2021   GLUCOSE 92 04/02/2021   BUN 14 04/02/2021   CREATININE 1.00 04/02/2021   CALCIUM 9.3 04/02/2021   GFRNONAA >60 04/02/2021   Lab Results  Component Value Date   CHOL 152 07/08/2021   HDL 81 07/08/2021   LDLCALC 59 07/08/2021   TRIG 57 07/08/2021   Lab Results  Component Value Date   TSH 0.69 07/08/2018   Lab Results  Component Value Date   HGBA1C 5.4 04/06/2019   Lab Results  Component Value Date   WBC 5.6 04/02/2021   HGB 14.4 04/02/2021   HCT 40.8 04/02/2021   MCV 93.6 04/02/2021   PLT 238 04/02/2021   Lab Results  Component Value Date   ALT 19 07/08/2021   AST 17 07/08/2021   ALKPHOS 110 09/03/2021   BILITOT 0.3 07/08/2021   No results found for: "25OHVITD2", "25OHVITD3", "VD25OH"   Review of Systems  Constitutional:  Negative for chills, fever and malaise/fatigue.   HENT:  Negative for drooling, ear discharge, ear pain, sinus pressure and sore throat.   Respiratory:  Negative for cough, shortness of breath and wheezing.   Cardiovascular:  Negative for chest pain, palpitations and leg swelling.  Gastrointestinal:  Negative for abdominal pain, blood in stool, constipation, diarrhea and nausea.  Endocrine: Negative for polydipsia.  Genitourinary:  Negative for dysuria, frequency, hematuria and urgency.  Musculoskeletal:  Negative for back pain, myalgias and neck pain.  Skin:  Negative for rash.  Allergic/Immunologic: Negative for environmental allergies.  Neurological:  Negative for dizziness, focal weakness and headaches.  Hematological:  Does not bruise/bleed easily.  Psychiatric/Behavioral:  Negative for sleep disturbance and suicidal ideas. The patient has insomnia. The patient is not nervous/anxious.     Patient Active Problem List   Diagnosis Date Noted   COVID 04/24/2022   Acute esophagitis    Columnar-lined esophagus    Hiatal hernia    Hyperlipidemia, mixed 07/01/2021   Insomnia disorder 07/01/2021   Environmental and seasonal allergies 07/01/2021   Esophageal dysphagia    Duodenal erythema    Esophagitis    Esophageal stenosis    Severe persistent asthma without complication 02/22/2021   Closed dislocation of tarsal joint of right foot 02/09/2021   Migraines 12/21/2017    No Known Allergies  Past Surgical History:  Procedure Laterality Date   BIOPSY N/A 04/29/2021   Procedure: BIOPSY;  Surgeon: Pasty Spillers, MD;  Location: [facility];  Service: Endoscopy;  Laterality: N/A;  ELBOW SURGERY Right 2007   ESOPHAGOGASTRODUODENOSCOPY (EGD) WITH PROPOFOL N/A 04/29/2021   Procedure: ESOPHAGOGASTRODUODENOSCOPY (EGD) WITH PROPOFOL;  Surgeon: Pasty Spillers, MD;  Location: [facility];  Service: Endoscopy;  Laterality: N/A;   ESOPHAGOGASTRODUODENOSCOPY (EGD) WITH PROPOFOL N/A 07/22/2021   Procedure:  ESOPHAGOGASTRODUODENOSCOPY (EGD) WITH PROPOFOL;  Surgeon: Pasty Spillers, MD;  Location: [facility];  Service: Endoscopy;  Laterality: N/A;   ETHMOIDECTOMY Bilateral 10/27/2019   Procedure: TOTAL ETHMOIDECTOMY WITH FRONTAL SINUOTOMY;  Surgeon: Vernie Murders, MD;  Location: [facility];  Service: ENT;  Laterality: Bilateral;   EYE SURGERY  2019   Replaced   HAND SURGERY  1983   HERNIA REPAIR  1995   IMAGE GUIDED SINUS SURGERY Bilateral 10/27/2019   Procedure: IMAGE GUIDED SINUS SURGERY;  Surgeon: Vernie Murders, MD;  Location: [facility];  Service: ENT;  Laterality: Bilateral;  need stryker disk placed disk on or charge nurse desk 2-17   kp   IMAGE GUIDED SINUS SURGERY Bilateral 04/26/2020   Procedure: IMAGE GUIDED SINUS SURGERY;  Surgeon: Vernie Murders, MD;  Location: [facility];  Service: ENT;  Laterality: Bilateral;  need stryker disk gave Steward Drone disk on 8-23 kp   MENISCUS REPAIR Left 2018   SEPTOPLASTY Bilateral 10/27/2019   Procedure: SEPTOPLASTY REVISION;  Surgeon: Vernie Murders, MD;  Location: [facility];  Service: ENT;  Laterality: Bilateral;   SPHENOIDECTOMY Right 04/26/2020   Procedure: Selina Cooley;  Surgeon: Vernie Murders, MD;  Location: [facility];  Service: ENT;  Laterality: Right;   TONSILLECTOMY  1964    Social History   Tobacco Use   Smoking status: Former    Packs/day: 1.00    Years: 26.00    Total pack years: 26.00    Types: Cigarettes    Quit date: 09/01/1988    Years since quitting: 33.8   Smokeless tobacco: Never  Vaping Use   Vaping Use: Never used  Substance Use Topics   Alcohol use: Yes    Alcohol/week: 7.0 standard drinks of alcohol    Types: 7 Cans of beer per week    Comment: Social drinking only   Drug use: Never     Medication list has been reviewed and updated.  Current Meds  Medication Sig   APPLE CIDER VINEGAR PO Take 500 mg by mouth.   budesonide (PULMICORT) 0.5 MG/2ML nebulizer  solution Inhale into the lungs.   Calcium Carbonate-Vitamin D (RA CALCIUM PLUS VITAMIN D PO) Take 525 mg by mouth 2 (two) times daily.   Cholecalciferol (VITAMIN D) 50 MCG (2000 UT) CAPS Take 1 capsule by mouth daily.   Coenzyme Q10 (COQ-10) 100 MG CAPS Take 1 each by mouth 2 (two) times daily.   EPINEPHrine 0.3 mg/0.3 mL IJ SOAJ injection    fexofenadine (ALLEGRA) 180 MG tablet Take 180 mg by mouth daily.   fluticasone (FLONASE) 50 MCG/ACT nasal spray Place 2 sprays into both nostrils daily.   Glucosamine-Chondroitin (GLUCOSAMINE CHONDR COMPLEX PO) Take 3,700 mg by mouth daily.   ibuprofen (ADVIL) 600 MG tablet Take 600 mg by mouth every 6 (six) hours as needed.   ipratropium-albuterol (DUONEB) 0.5-2.5 (3) MG/3ML SOLN Inhale into the lungs.   Lasmiditan Succinate (REYVOW) 100 MG TABS Take by mouth.   montelukast (SINGULAIR) 10 MG tablet Take 1 tablet by mouth daily. Dr A.   naratriptan (AMERGE) 2.5 MG tablet as needed.   omeprazole (PRILOSEC) 20 MG capsule Take 1 capsule (20 mg total) by mouth daily.   polycarbophil (  FIBERCON) 625 MG tablet Take 1,250 mg by mouth 2 (two) times daily.   Riboflavin 400 MG TABS Take 1 tablet by mouth daily.   rosuvastatin (CRESTOR) 10 MG tablet Take 1 tablet daily   traZODone (DESYREL) 50 MG tablet TAKE 1 TO 2 TABLETS AT BEDTIME AS NEEDED FOR SLEEP   vitamin C (ASCORBIC ACID) 500 MG tablet Take 500 mg by mouth daily.   Vitamin E 180 MG CAPS Take 1 capsule by mouth daily.   Vitamins-Lipotropics (LIPO-FLAVONOID PLUS PO) Take 1,000 mg by mouth 2 (two) times daily.   Zinc 50 MG CAPS Take 1 capsule by mouth daily.       07/09/2022    8:41 AM 04/24/2022    8:13 AM 01/06/2022    8:19 AM 07/08/2021   10:28 AM  GAD 7 : Generalized Anxiety Score  Nervous, Anxious, on Edge 0 0 0 0  Control/stop worrying 0 0 0 0  Worry too much - different things 0 0 0 0  Trouble relaxing 0 0 0 0  Restless 0 0 0 0  Easily annoyed or irritable 0 0 0 0  Afraid - awful might happen 0  0 0 0  Total GAD 7 Score 0 0 0 0  Anxiety Difficulty Not difficult at all Not difficult at all Not difficult at all        07/09/2022    8:41 AM 06/25/2022    8:50 AM 04/24/2022    8:12 AM  Depression screen PHQ 2/9  Decreased Interest 0 0 0  Down, Depressed, Hopeless 0 0 0  PHQ - 2 Score 0 0 0  Altered sleeping 0 1 1  Tired, decreased energy 0 0 1  Change in appetite 0 0 0  Feeling bad or failure about yourself  0 0 0  Trouble concentrating 0 0 0  Moving slowly or fidgety/restless 0 0 0  Suicidal thoughts 0 0 0  PHQ-9 Score 0 1 2  Difficult doing work/chores Not difficult at all Not difficult at all Not difficult at all    BP Readings from Last 3 Encounters:  07/09/22 128/78  06/25/22 118/78  01/06/22 138/68    Physical Exam Vitals and nursing note reviewed.  HENT:     Head: Normocephalic.     Right Ear: Tympanic membrane and external ear normal.     Left Ear: Tympanic membrane and external ear normal.     Nose: Nose normal.     Mouth/Throat:     Mouth: Mucous membranes are moist.  Eyes:     General: No scleral icterus.       Right eye: No discharge.        Left eye: No discharge.     Conjunctiva/sclera: Conjunctivae normal.     Pupils: Pupils are equal, round, and reactive to light.  Neck:     Thyroid: No thyromegaly.     Vascular: No JVD.     Trachea: No tracheal deviation.  Cardiovascular:     Rate and Rhythm: Normal rate and regular rhythm.     Heart sounds: Normal heart sounds. No murmur heard.    No friction rub. No gallop.  Pulmonary:     Effort: No respiratory distress.     Breath sounds: Normal breath sounds. No wheezing, rhonchi or rales.  Abdominal:     General: Bowel sounds are normal.     Palpations: Abdomen is soft. There is no mass.     Tenderness: There is no abdominal tenderness.  There is no guarding or rebound.  Musculoskeletal:        General: No tenderness. Normal range of motion.     Cervical back: Normal range of motion and neck  supple.  Lymphadenopathy:     Cervical: No cervical adenopathy.  Skin:    General: Skin is warm.     Findings: No rash.  Neurological:     Mental Status: He is alert and oriented to person, place, and time.     Cranial Nerves: No cranial nerve deficit.     Deep Tendon Reflexes: Reflexes are normal and symmetric.     Wt Readings from Last 3 Encounters:  07/09/22 154 lb (69.9 kg)  06/25/22 154 lb 6.4 oz (70 kg)  01/06/22 157 lb (71.2 kg)    BP 128/78   Pulse 90   Ht 5\' 5"  (1.651 m)   Wt 154 lb (69.9 kg)   SpO2 95%   BMI 25.63 kg/m   Assessment and Plan:  1. Hyperlipidemia, unspecified hyperlipidemia type Chronic.  Controlled.  Stable.  Continue rosuvastatin 10 mg once a day.  Check lipid panel for current status of LDL. - rosuvastatin (CRESTOR) 10 MG tablet; Take 1 tablet daily  Dispense: 90 tablet; Refill: 1 - Lipid Panel With LDL/HDL Ratio  2. Primary insomnia Chronic.  Controlled.  Improved.  Continue trazodone for 50 mg 1 to 2 tablets as needed for sleep. - traZODone (DESYREL) 50 MG tablet; TAKE 1 TO 2 TABLETS AT BEDTIME AS NEEDED FOR SLEEP  Dispense: 180 tablet; Refill: 1  3. Taking medication for chronic disease We will check comprehensive metabolic panel including liver functions to evaluate for effects of statin agent. - Comprehensive Metabolic Panel (CMET)    , MD

## 2022-07-10 LAB — COMPREHENSIVE METABOLIC PANEL
ALT: 21 IU/L (ref 0–44)
AST: 15 IU/L (ref 0–40)
Albumin/Globulin Ratio: 2 (ref 1.2–2.2)
Albumin: 4.5 g/dL (ref 3.9–4.9)
Alkaline Phosphatase: 98 IU/L (ref 44–121)
BUN/Creatinine Ratio: 22 (ref 10–24)
BUN: 24 mg/dL (ref 8–27)
Bilirubin Total: 0.3 mg/dL (ref 0.0–1.2)
CO2: 23 mmol/L (ref 20–29)
Calcium: 9.6 mg/dL (ref 8.6–10.2)
Chloride: 103 mmol/L (ref 96–106)
Creatinine, Ser: 1.09 mg/dL (ref 0.76–1.27)
Globulin, Total: 2.3 g/dL (ref 1.5–4.5)
Glucose: 121 mg/dL — ABNORMAL HIGH (ref 70–99)
Potassium: 4.9 mmol/L (ref 3.5–5.2)
Sodium: 139 mmol/L (ref 134–144)
Total Protein: 6.8 g/dL (ref 6.0–8.5)
eGFR: 73 mL/min/{1.73_m2} (ref >59)

## 2022-07-10 LAB — LIPID PANEL WITH LDL/HDL RATIO
Cholesterol, Total: 186 mg/dL (ref 100–199)
HDL: 62 mg/dL (ref >39)
LDL Chol Calc (NIH): 101 mg/dL — ABNORMAL HIGH (ref 0–99)
LDL/HDL Ratio: 1.6 ratio (ref 0.0–3.6)
Triglycerides: 133 mg/dL (ref 0–149)
VLDL Cholesterol Cal: 23 mg/dL (ref 5–40)

## 2022-07-15 DIAGNOSIS — J301 Allergic rhinitis due to pollen: Secondary | ICD-10-CM | POA: Diagnosis not present

## 2022-07-21 ENCOUNTER — Other Ambulatory Visit: Payer: Self-pay

## 2022-07-22 ENCOUNTER — Ambulatory Visit: Payer: Medicare PPO | Admitting: Gastroenterology

## 2022-07-22 ENCOUNTER — Encounter: Payer: Self-pay | Admitting: Gastroenterology

## 2022-07-22 VITALS — BP 121/80 | HR 85 | Temp 98.7°F

## 2022-07-22 DIAGNOSIS — Z87898 Personal history of other specified conditions: Secondary | ICD-10-CM

## 2022-07-22 DIAGNOSIS — K2270 Barrett's esophagus without dysplasia: Secondary | ICD-10-CM

## 2022-07-22 DIAGNOSIS — Z79899 Other long term (current) drug therapy: Secondary | ICD-10-CM

## 2022-07-22 DIAGNOSIS — K219 Gastro-esophageal reflux disease without esophagitis: Secondary | ICD-10-CM

## 2022-07-22 DIAGNOSIS — J301 Allergic rhinitis due to pollen: Secondary | ICD-10-CM | POA: Diagnosis not present

## 2022-07-22 MED ORDER — OMEPRAZOLE 20 MG PO CPDR: 20.0000 mg | DELAYED_RELEASE_CAPSULE | Freq: Every day | ORAL | 3 refills | Status: DC

## 2022-07-22 NOTE — Patient Instructions (Addendum)
Please purchase a wedge pillow at [facility] to help you with your acid reflux.  Barrett's Esophagus  Barrett's esophagus occurs when the tissue that lines the esophagus changes or becomes damaged. The esophagus is the tube that carries food from the throat to the stomach. With Barrett's esophagus, the cells that line the esophagus are replaced by cells that are similar to the lining of the intestines (intestinal metaplasia). Barrett's esophagus itself may not cause any symptoms. However, many people who have Barrett's esophagus also have gastroesophageal reflux disease (GERD), which may cause symptoms such as heartburn. Over time, a few people with this condition may develop cancer of the esophagus. Treatment may include medicines, procedures to destroy the abnormal cells, or surgery. What are the causes? The exact cause of this condition is not known. In some cases, the condition develops from damage to the lining of the esophagus caused by gastroesophageal reflux disease (GERD). GERD occurs when stomach acids flow up from the stomach into the esophagus. Frequent symptoms of GERD may cause intestinal metaplasia or cause cell changes (dysplasia). What increases the risk? You are more likely to develop this condition if you: Have GERD. Are male. Are of European descent. Are obese. Are older than 50. Have a hiatal hernia. This is a condition in which part of your stomach bulges into your chest. Smoke. What are the signs or symptoms? People with Barrett's esophagus often have no symptoms. However, many people with this condition also have GERD. Symptoms of GERD may include: Heartburn. Difficulty swallowing. Dry cough. How is this diagnosed? This condition may be diagnosed based on: Results of an upper gastrointestinal endoscopy. For this exam, a thin, flexible tube with a light and a camera on the end (endoscope) is passed down your esophagus. Your health care provider can view the inside of your  esophagus during this procedure. Results of a biopsy. For this procedure, several tissue samples are removed (biopsy) from your esophagus to look at under a microscope. They are then checked for intestinal metaplasia or dysplasia. How is this treated? Treatment for this condition may include: Medicines (proton pump inhibitors, or PPIs) to decrease or stop GERD. Periodic endoscopic exams to make sure that cancer is not developing. A procedure or surgery for dysplasia. This may include: Removal or destruction of abnormal cells. Removal of part of the esophagus. Follow these instructions at home: Eating and drinking Eat more fruits and vegetables. Avoid fatty foods. Eat small, frequent meals instead of large meals. Avoid foods that cause heartburn. These foods include: Coffee and alcoholic drinks. Tomatoes and foods made with tomatoes. Greasy or spicy foods. Chocolate and peppermint. Do not drink alcohol. General instructions Take over-the-counter and prescription medicines only as told by your health care provider. Do not use any products that contain nicotine or tobacco, such as cigarettes, e-cigarettes, and chewing tobacco. If you need help quitting, ask your health care provider. If you are being treated for GERD, make sure you take medicines and follow all instructions as told by your health care provider. Keep all follow-up visits as told by your health care provider. This is important. Contact a health care provider if: You have heartburn or GERD symptoms. You have difficulty swallowing. Get help right away if: You have chest pain. You are unable to swallow. You vomit blood or material that looks like coffee grounds. Your stool (feces) is bright red or dark. These symptoms may represent a serious problem that is an emergency. Do not wait to see if the symptoms  will go away. Get medical help right away. Call your local emergency services (911 in the [place] Do not drive yourself to  the hospital. Summary Barrett's esophagus occurs when the tissue that lines the esophagus changes or becomes damaged. Barrett's esophagus may be diagnosed with an upper gastrointestinal endoscopy and a biopsy. Treatment may include medicines, procedures to remove abnormal cells, or surgery. Follow your health care provider's instructions about what to eat and drink, what medicines to take, and when to call for help. This information is not intended to replace advice given to you by your health care provider. Make sure you discuss any questions you have with your health care provider. Document Revised: 11/05/2019 Document Reviewed: 11/05/2019 Elsevier Patient Education  2023 [employer]  Food Choices for Gastroesophageal Reflux Disease, Adult When you have gastroesophageal reflux disease (GERD), the foods you eat and your eating habits are very important. Choosing the right foods can help ease your discomfort. Think about working with a food expert (dietitian) to help you make good choices. What are tips for following this plan? Reading food labels Look for foods that are low in saturated fat. Foods that may help with your symptoms include: Foods that have less than 5% of daily value (DV) of fat. Foods that have 0 grams of trans fat. Cooking Do not fry your food. Cook your food by baking, steaming, grilling, or broiling. These are all methods that do not need a lot of fat for cooking. To add flavor, try to use herbs that are low in spice and acidity. Meal planning  Choose healthy foods that are low in fat, such as: Fruits and vegetables. Whole grains. Low-fat dairy products. Lean meats, fish, and poultry. Eat small meals often instead of eating 3 large meals each day. Eat your meals slowly in a place where you are relaxed. Avoid bending over or lying down until 2-3 hours after eating. Limit high-fat foods such as fatty meats or fried foods. Limit your intake of fatty foods, such as  oils, butter, and shortening. Avoid the following as told by your doctor: Foods that cause symptoms. These may be different for different people. Keep a food diary to keep track of foods that cause symptoms. Alcohol. Drinking a lot of liquid with meals. Eating meals during the 2-3 hours before bed. Lifestyle Stay at a healthy weight. Ask your doctor what weight is healthy for you. If you need to lose weight, work with your doctor to do so safely. Exercise for at least 30 minutes on 5 or more days each week, or as told by your doctor. Wear loose-fitting clothes. Do not smoke or use any products that contain nicotine or tobacco. If you need help quitting, ask your doctor. Sleep with the head of your bed higher than your feet. Use a wedge under the mattress or blocks under the bed frame to raise the head of the bed. Chew sugar-free gum after meals. What foods should eat?  Eat a healthy, well-balanced diet of fruits, vegetables, whole grains, low-fat dairy products, lean meats, fish, and poultry. Each person is different. Foods that may cause symptoms in one person may not cause any symptoms in another person. Work with your doctor to find foods that are safe for you. The items listed above may not be a complete list of what you can eat and drink. Contact a food expert for more options. What foods should I avoid? Limiting some of these foods may help in managing the symptoms of GERD.  Everyone is different. Talk with a food expert or your doctor to help you find the exact foods to avoid, if any. Fruits Any fruits prepared with added fat. Any fruits that cause symptoms. For some people, this may include citrus fruits, such as oranges, grapefruit, pineapple, and lemons. Vegetables Deep-fried vegetables. [place] fries. Any vegetables prepared with added fat. Any vegetables that cause symptoms. For some people, this may include tomatoes and tomato products, chili peppers, onions and garlic, and  horseradish. Grains Pastries or quick breads with added fat. Meats and other proteins High-fat meats, such as fatty beef or pork, hot dogs, ribs, ham, sausage, salami, and bacon. Fried meat or protein, including fried fish and fried chicken. Nuts and nut butters, in large amounts. Dairy Whole milk and chocolate milk. Sour cream. Cream. Ice cream. Cream cheese. Milkshakes. Fats and oils Butter. Margarine. Shortening. Ghee. Beverages Coffee and tea, with or without caffeine. Carbonated beverages. Sodas. Energy drinks. Fruit juice made with acidic fruits, such as orange or grapefruit. Tomato juice. Alcoholic drinks. Sweets and desserts Chocolate and cocoa. Donuts. Seasonings and condiments Pepper. Peppermint and spearmint. Added salt. Any condiments, herbs, or seasonings that cause symptoms. For some people, this may include curry, hot sauce, or vinegar-based salad dressings. The items listed above may not be a complete list of what you should not eat and drink. Contact a food expert for more options. Questions to ask your doctor Diet and lifestyle changes are often the first steps that are taken to manage symptoms of GERD. If diet and lifestyle changes do not help, talk with your doctor about taking medicines. Where to find more information [employer] G[employer]: aboutgerd.org Summary When you have GERD, food and lifestyle choices are very important in easing your symptoms. Eat small meals often instead of 3 large meals a day. Eat your meals slowly and in a place where you are relaxed. Avoid bending over or lying down until 2-3 hours after eating. Limit high-fat foods such as fatty meats or fried foods. This information is not intended to replace advice given to you by your health care provider. Make sure you discuss any questions you have with your health care provider. Document Revised: 02/27/2020 Document Reviewed: 02/27/2020 Elsevier Patient Education   2023 [employer].

## 2022-07-22 NOTE — Progress Notes (Signed)
Wyline Mood MD, MRCP(U.K) [address]  [address]  [place], [place] [address]  Main: [phone]  Fax: [phone]   Primary Care Physician: Duanne Limerick, MD  Primary Gastroenterologist:  Dr. Wyline Mood   Chief Complaint  Patient presents with   dyshagia    HPI: Blake Patrick is a 71 y.o. male   Summary of history :  Previously seen Dr Maximino Greenland back in 07/2021 for dysphagia. EGD in 07/2021, tongue of Barettes esophagus with no dysplasia 1 cm long.   Interval history   07/08/2021-07/22/2022   He is well no complaints of dysphagia ran out of his PPI 3 days back and started developing some symptoms of reflux while on PPI he has almost no symptoms.  No other complaints does not smoke.   Current Outpatient Medications  Medication Sig Dispense Refill   albuterol (PROVENTIL) (2.5 MG/3ML) 0.083% nebulizer solution Take 2.5 mg by nebulization every 2 (two) hours as needed.     APPLE CIDER VINEGAR PO Take 500 mg by mouth.     Calcium Carbonate-Vitamin D (RA CALCIUM PLUS VITAMIN D PO) Take 525 mg by mouth 2 (two) times daily.     Cholecalciferol (VITAMIN D) 50 MCG (2000 UT) CAPS Take 1 capsule by mouth daily.     Coenzyme Q10 (COQ-10) 100 MG CAPS Take 1 each by mouth 2 (two) times daily.     EPINEPHrine 0.3 mg/0.3 mL IJ SOAJ injection      fexofenadine (ALLEGRA) 180 MG tablet Take 180 mg by mouth daily.     fluticasone (FLONASE) 50 MCG/ACT nasal spray Place 2 sprays into both nostrils daily.     Glucosamine-Chondroitin (GLUCOSAMINE CHONDR COMPLEX PO) Take 3,700 mg by mouth daily.     ibuprofen (ADVIL) 600 MG tablet Take 600 mg by mouth every 6 (six) hours as needed.     Lasmiditan Succinate (REYVOW) 100 MG TABS Take by mouth.     naratriptan (AMERGE) 2.5 MG tablet as needed.     polycarbophil (FIBERCON) 625 MG tablet Take 1,250 mg by mouth 2 (two) times daily.     Riboflavin 400 MG TABS Take 1 tablet by mouth daily.     rosuvastatin (CRESTOR) 10 MG tablet Take 1 tablet  daily 90 tablet 1   traZODone (DESYREL) 50 MG tablet TAKE 1 TO 2 TABLETS AT BEDTIME AS NEEDED FOR SLEEP 180 tablet 1   vitamin C (ASCORBIC ACID) 500 MG tablet Take 500 mg by mouth daily.     Vitamin E 180 MG CAPS Take 1 capsule by mouth daily.     Vitamins-Lipotropics (LIPO-FLAVONOID PLUS PO) Take 1,000 mg by mouth 2 (two) times daily.     Zinc 50 MG CAPS Take 1 capsule by mouth daily.     budesonide (PULMICORT) 0.5 MG/2ML nebulizer solution Inhale into the lungs. (Patient not taking: Reported on 07/22/2022)     DULoxetine (CYMBALTA) 20 MG capsule Take 20 mg by mouth 2 (two) times daily.     montelukast (SINGULAIR) 10 MG tablet Take 1 tablet by mouth daily. Dr A.     omeprazole (PRILOSEC) 20 MG capsule Take 1 capsule (20 mg total) by mouth daily. 90 capsule 0   No current facility-administered medications for this visit.    Allergies as of 07/22/2022   (No Known Allergies)    ROS:  General: Negative for anorexia, weight loss, fever, chills, fatigue, weakness. ENT: Negative for hoarseness, difficulty swallowing , nasal congestion. CV: Negative for chest pain, angina, palpitations,  dyspnea on exertion, peripheral edema.  Respiratory: Negative for dyspnea at rest, dyspnea on exertion, cough, sputum, wheezing.  GI: See history of present illness. GU:  Negative for dysuria, hematuria, urinary incontinence, urinary frequency, nocturnal urination.  Endo: Negative for unusual weight change.    Physical Examination:   BP 121/80   Pulse 85   Temp 98.7 F (37.1 C) (Oral)   General: Well-nourished, well-developed in no acute distress.  Eyes: No icterus. Conjunctivae pink. Extremities: No lower extremity edema. No clubbing or deformities. Neuro: Alert and oriented x 3.  Grossly intact. Skin: Warm and dry, no jaundice.   Psych: Alert and cooperative, normal mood and affect.   Imaging Studies: No results found.  Assessment and Plan:   Blake Patrick is a 71 y.o. y/o male here to follow  up for dysphagia and found to have a short segment barrettes esophagus on last EGD 1 cm long.  With a history of Barrett's esophagus and the risk of transformation into malignancy, the risks of long-term PPI use are outweighed by the benefits.  Discussed with the patient about the risks including but not limited to decrease in bone density, low magnesium increased risk of infection possible chronic kidney disease decision made to continue PPI and discussed that possibly the lowest dose at 20 mg/day would be the best option  Plan  PPI EGD for dysplasia evaluation in 2027  Patient information provided on Barrett's esophagus Discussed about lifestyle changes for acid reflux including keeping the head of the bed elevated at all times having small meals more often rather than large meals at a time, picture of wedge pillow will be provided    Dr Jonathon Bellows  MD,MRCP [facility]) Follow up 1 year

## 2022-07-24 ENCOUNTER — Other Ambulatory Visit: Payer: Self-pay | Admitting: Family Medicine

## 2022-07-24 DIAGNOSIS — E785 Hyperlipidemia, unspecified: Secondary | ICD-10-CM

## 2022-07-25 NOTE — Telephone Encounter (Signed)
Rx 07/09/22 #90 1RF- too soon Requested Prescriptions  Pending Prescriptions Disp Refills   rosuvastatin (CRESTOR) 10 MG tablet [Pharmacy Med Name: ROSUVASTATIN CALCIUM 10 MG Tablet] 90 tablet 3    Sig: TAKE 1 TABLET EVERY DAY (DUE FOR BLOOD WORK)     Cardiovascular:  Antilipid - Statins 2 Failed - 07/24/2022  2:37 AM      Failed - Lipid Panel in normal range within the last 12 months    Cholesterol, Total  Date Value Ref Range Status  07/09/2022 186 100 - 199 mg/dL Final   LDL Chol Calc (NIH)  Date Value Ref Range Status  07/09/2022 101 (H) 0 - 99 mg/dL Final   HDL  Date Value Ref Range Status  07/09/2022 62 >39 mg/dL Final   Triglycerides  Date Value Ref Range Status  07/09/2022 133 0 - 149 mg/dL Final         Passed - Cr in normal range and within 360 days    Creatinine, Ser  Date Value Ref Range Status  07/09/2022 1.09 0.76 - 1.27 mg/dL Final         Passed - Patient is not pregnant      Passed - Valid encounter within last 12 months    Recent Outpatient Visits           2 weeks ago Hyperlipidemia, unspecified hyperlipidemia type   [place] [facility], MD   3 months ago COVID   [facility], Ocie Bob, MD   6 months ago Hyperlipidemia, unspecified hyperlipidemia type   [facility], MD   8 months ago TMJ click   [place] [facility], MD   10 months ago Acute left-sided low back pain without sciatica    [facility], MD       Future Appointments             In 5 months Duanne Limerick, MD [facility] at [facility], [facility]

## 2022-07-29 DIAGNOSIS — M85871 Other specified disorders of bone density and structure, right ankle and foot: Secondary | ICD-10-CM | POA: Diagnosis not present

## 2022-07-29 DIAGNOSIS — M25471 Effusion, right ankle: Secondary | ICD-10-CM | POA: Diagnosis not present

## 2022-07-29 DIAGNOSIS — J301 Allergic rhinitis due to pollen: Secondary | ICD-10-CM | POA: Diagnosis not present

## 2022-07-29 DIAGNOSIS — M19071 Primary osteoarthritis, right ankle and foot: Secondary | ICD-10-CM | POA: Diagnosis not present

## 2022-08-04 DIAGNOSIS — M79671 Pain in right foot: Secondary | ICD-10-CM | POA: Diagnosis not present

## 2022-08-04 DIAGNOSIS — M19071 Primary osteoarthritis, right ankle and foot: Secondary | ICD-10-CM | POA: Diagnosis not present

## 2022-08-04 DIAGNOSIS — M6281 Muscle weakness (generalized): Secondary | ICD-10-CM | POA: Diagnosis not present

## 2022-08-04 DIAGNOSIS — R262 Difficulty in walking, not elsewhere classified: Secondary | ICD-10-CM | POA: Diagnosis not present

## 2022-08-05 DIAGNOSIS — J301 Allergic rhinitis due to pollen: Secondary | ICD-10-CM | POA: Diagnosis not present

## 2022-08-07 DIAGNOSIS — M19071 Primary osteoarthritis, right ankle and foot: Secondary | ICD-10-CM | POA: Diagnosis not present

## 2022-08-07 DIAGNOSIS — M6281 Muscle weakness (generalized): Secondary | ICD-10-CM | POA: Diagnosis not present

## 2022-08-07 DIAGNOSIS — M79671 Pain in right foot: Secondary | ICD-10-CM | POA: Diagnosis not present

## 2022-08-07 DIAGNOSIS — R262 Difficulty in walking, not elsewhere classified: Secondary | ICD-10-CM | POA: Diagnosis not present

## 2022-08-11 DIAGNOSIS — R262 Difficulty in walking, not elsewhere classified: Secondary | ICD-10-CM | POA: Diagnosis not present

## 2022-08-11 DIAGNOSIS — M79671 Pain in right foot: Secondary | ICD-10-CM | POA: Diagnosis not present

## 2022-08-11 DIAGNOSIS — M19071 Primary osteoarthritis, right ankle and foot: Secondary | ICD-10-CM | POA: Diagnosis not present

## 2022-08-11 DIAGNOSIS — M6281 Muscle weakness (generalized): Secondary | ICD-10-CM | POA: Diagnosis not present

## 2022-08-12 DIAGNOSIS — J301 Allergic rhinitis due to pollen: Secondary | ICD-10-CM | POA: Diagnosis not present

## 2022-08-20 DIAGNOSIS — M791 Myalgia, unspecified site: Secondary | ICD-10-CM | POA: Diagnosis not present

## 2022-08-21 DIAGNOSIS — N401 Enlarged prostate with lower urinary tract symptoms: Secondary | ICD-10-CM | POA: Diagnosis not present

## 2022-08-21 DIAGNOSIS — R350 Frequency of micturition: Secondary | ICD-10-CM | POA: Diagnosis not present

## 2022-08-21 DIAGNOSIS — Z8551 Personal history of malignant neoplasm of bladder: Secondary | ICD-10-CM | POA: Diagnosis not present

## 2022-08-22 DIAGNOSIS — M6281 Muscle weakness (generalized): Secondary | ICD-10-CM | POA: Diagnosis not present

## 2022-08-22 DIAGNOSIS — M19071 Primary osteoarthritis, right ankle and foot: Secondary | ICD-10-CM | POA: Diagnosis not present

## 2022-08-22 DIAGNOSIS — R262 Difficulty in walking, not elsewhere classified: Secondary | ICD-10-CM | POA: Diagnosis not present

## 2022-08-22 DIAGNOSIS — M79671 Pain in right foot: Secondary | ICD-10-CM | POA: Diagnosis not present

## 2022-08-26 DIAGNOSIS — J301 Allergic rhinitis due to pollen: Secondary | ICD-10-CM | POA: Diagnosis not present

## 2022-09-02 DIAGNOSIS — J301 Allergic rhinitis due to pollen: Secondary | ICD-10-CM | POA: Diagnosis not present

## 2022-09-02 DIAGNOSIS — M6281 Muscle weakness (generalized): Secondary | ICD-10-CM | POA: Diagnosis not present

## 2022-09-02 DIAGNOSIS — M79671 Pain in right foot: Secondary | ICD-10-CM | POA: Diagnosis not present

## 2022-09-02 DIAGNOSIS — M19071 Primary osteoarthritis, right ankle and foot: Secondary | ICD-10-CM | POA: Diagnosis not present

## 2022-09-02 DIAGNOSIS — R262 Difficulty in walking, not elsewhere classified: Secondary | ICD-10-CM | POA: Diagnosis not present

## 2022-09-04 DIAGNOSIS — M6281 Muscle weakness (generalized): Secondary | ICD-10-CM | POA: Diagnosis not present

## 2022-09-04 DIAGNOSIS — R262 Difficulty in walking, not elsewhere classified: Secondary | ICD-10-CM | POA: Diagnosis not present

## 2022-09-04 DIAGNOSIS — M19071 Primary osteoarthritis, right ankle and foot: Secondary | ICD-10-CM | POA: Diagnosis not present

## 2022-09-04 DIAGNOSIS — M79671 Pain in right foot: Secondary | ICD-10-CM | POA: Diagnosis not present

## 2022-09-09 DIAGNOSIS — M19071 Primary osteoarthritis, right ankle and foot: Secondary | ICD-10-CM | POA: Diagnosis not present

## 2022-09-09 DIAGNOSIS — Z981 Arthrodesis status: Secondary | ICD-10-CM | POA: Diagnosis not present

## 2022-09-09 DIAGNOSIS — M85871 Other specified disorders of bone density and structure, right ankle and foot: Secondary | ICD-10-CM | POA: Diagnosis not present

## 2022-09-09 DIAGNOSIS — J301 Allergic rhinitis due to pollen: Secondary | ICD-10-CM | POA: Diagnosis not present

## 2022-09-09 DIAGNOSIS — W11XXXD Fall on and from ladder, subsequent encounter: Secondary | ICD-10-CM | POA: Diagnosis not present

## 2022-09-09 DIAGNOSIS — M7731 Calcaneal spur, right foot: Secondary | ICD-10-CM | POA: Diagnosis not present

## 2022-09-09 DIAGNOSIS — S93314D Dislocation of tarsal joint of right foot, subsequent encounter: Secondary | ICD-10-CM | POA: Diagnosis not present

## 2022-09-09 DIAGNOSIS — M24571 Contracture, right ankle: Secondary | ICD-10-CM | POA: Diagnosis not present

## 2022-09-10 DIAGNOSIS — M6281 Muscle weakness (generalized): Secondary | ICD-10-CM | POA: Diagnosis not present

## 2022-09-10 DIAGNOSIS — M79671 Pain in right foot: Secondary | ICD-10-CM | POA: Diagnosis not present

## 2022-09-10 DIAGNOSIS — R262 Difficulty in walking, not elsewhere classified: Secondary | ICD-10-CM | POA: Diagnosis not present

## 2022-09-10 DIAGNOSIS — M19071 Primary osteoarthritis, right ankle and foot: Secondary | ICD-10-CM | POA: Diagnosis not present

## 2022-09-12 DIAGNOSIS — M6281 Muscle weakness (generalized): Secondary | ICD-10-CM | POA: Diagnosis not present

## 2022-09-12 DIAGNOSIS — R262 Difficulty in walking, not elsewhere classified: Secondary | ICD-10-CM | POA: Diagnosis not present

## 2022-09-12 DIAGNOSIS — M19071 Primary osteoarthritis, right ankle and foot: Secondary | ICD-10-CM | POA: Diagnosis not present

## 2022-09-16 DIAGNOSIS — J301 Allergic rhinitis due to pollen: Secondary | ICD-10-CM | POA: Diagnosis not present

## 2022-09-17 DIAGNOSIS — R262 Difficulty in walking, not elsewhere classified: Secondary | ICD-10-CM | POA: Diagnosis not present

## 2022-09-17 DIAGNOSIS — M79671 Pain in right foot: Secondary | ICD-10-CM | POA: Diagnosis not present

## 2022-09-17 DIAGNOSIS — M6281 Muscle weakness (generalized): Secondary | ICD-10-CM | POA: Diagnosis not present

## 2022-09-17 DIAGNOSIS — M19071 Primary osteoarthritis, right ankle and foot: Secondary | ICD-10-CM | POA: Diagnosis not present

## 2022-09-19 DIAGNOSIS — R262 Difficulty in walking, not elsewhere classified: Secondary | ICD-10-CM | POA: Diagnosis not present

## 2022-09-19 DIAGNOSIS — M6281 Muscle weakness (generalized): Secondary | ICD-10-CM | POA: Diagnosis not present

## 2022-09-19 DIAGNOSIS — M19071 Primary osteoarthritis, right ankle and foot: Secondary | ICD-10-CM | POA: Diagnosis not present

## 2022-09-19 DIAGNOSIS — M79671 Pain in right foot: Secondary | ICD-10-CM | POA: Diagnosis not present

## 2022-09-23 DIAGNOSIS — J301 Allergic rhinitis due to pollen: Secondary | ICD-10-CM | POA: Diagnosis not present

## 2022-09-24 DIAGNOSIS — M79671 Pain in right foot: Secondary | ICD-10-CM | POA: Diagnosis not present

## 2022-09-24 DIAGNOSIS — M19071 Primary osteoarthritis, right ankle and foot: Secondary | ICD-10-CM | POA: Diagnosis not present

## 2022-09-24 DIAGNOSIS — M6281 Muscle weakness (generalized): Secondary | ICD-10-CM | POA: Diagnosis not present

## 2022-09-24 DIAGNOSIS — R262 Difficulty in walking, not elsewhere classified: Secondary | ICD-10-CM | POA: Diagnosis not present

## 2022-09-26 DIAGNOSIS — L0292 Furuncle, unspecified: Secondary | ICD-10-CM | POA: Diagnosis not present

## 2022-09-26 DIAGNOSIS — M79671 Pain in right foot: Secondary | ICD-10-CM | POA: Diagnosis not present

## 2022-09-26 DIAGNOSIS — R262 Difficulty in walking, not elsewhere classified: Secondary | ICD-10-CM | POA: Diagnosis not present

## 2022-09-26 DIAGNOSIS — M6281 Muscle weakness (generalized): Secondary | ICD-10-CM | POA: Diagnosis not present

## 2022-09-26 DIAGNOSIS — M19071 Primary osteoarthritis, right ankle and foot: Secondary | ICD-10-CM | POA: Diagnosis not present

## 2022-09-29 DIAGNOSIS — J301 Allergic rhinitis due to pollen: Secondary | ICD-10-CM | POA: Diagnosis not present

## 2022-09-30 DIAGNOSIS — M79671 Pain in right foot: Secondary | ICD-10-CM | POA: Diagnosis not present

## 2022-09-30 DIAGNOSIS — R262 Difficulty in walking, not elsewhere classified: Secondary | ICD-10-CM | POA: Diagnosis not present

## 2022-09-30 DIAGNOSIS — M19071 Primary osteoarthritis, right ankle and foot: Secondary | ICD-10-CM | POA: Diagnosis not present

## 2022-09-30 DIAGNOSIS — M6281 Muscle weakness (generalized): Secondary | ICD-10-CM | POA: Diagnosis not present

## 2022-09-30 DIAGNOSIS — J301 Allergic rhinitis due to pollen: Secondary | ICD-10-CM | POA: Diagnosis not present

## 2022-10-03 DIAGNOSIS — R262 Difficulty in walking, not elsewhere classified: Secondary | ICD-10-CM | POA: Diagnosis not present

## 2022-10-03 DIAGNOSIS — M79671 Pain in right foot: Secondary | ICD-10-CM | POA: Diagnosis not present

## 2022-10-03 DIAGNOSIS — M19071 Primary osteoarthritis, right ankle and foot: Secondary | ICD-10-CM | POA: Diagnosis not present

## 2022-10-03 DIAGNOSIS — M6281 Muscle weakness (generalized): Secondary | ICD-10-CM | POA: Diagnosis not present

## 2022-10-04 ENCOUNTER — Emergency Department: Payer: Medicare PPO

## 2022-10-04 ENCOUNTER — Emergency Department
Admission: EM | Admit: 2022-10-04 | Discharge: 2022-10-04 | Disposition: A | Discharge status: Home / Self Care | Payer: Medicare PPO | Attending: Emergency Medicine | Admitting: Emergency Medicine

## 2022-10-04 ENCOUNTER — Other Ambulatory Visit: Payer: Self-pay

## 2022-10-04 DIAGNOSIS — M79641 Pain in right hand: Secondary | ICD-10-CM | POA: Diagnosis not present

## 2022-10-04 DIAGNOSIS — S6991XA Unspecified injury of right wrist, hand and finger(s), initial encounter: Secondary | ICD-10-CM | POA: Diagnosis not present

## 2022-10-04 NOTE — Discharge Instructions (Signed)
Your x-ray is normal.  Please wear the finger splint for the next few days, and apply ice to the top of your hand.  You may take Tylenol 650 mg or ibuprofen 600 mg every 6-8 hours to help with your pain.  Please return for any new, worsening, or change in symptoms or other concerns.  It was a pleasure caring for you today.

## 2022-10-04 NOTE — ED Triage Notes (Signed)
Pt presents via POV c/o right hand pain. Reports felt pop while using screw driver.

## 2022-10-04 NOTE — ED Provider Notes (Signed)
[facility] Provider Note    Event Date/Time   First MD Initiated Contact with Patient 10/04/22 1102     (approximate)   History   Hand Pain   HPI  Blake Patrick is a 72 y.o. male right-hand-dominant who presents today for evaluation of right hand injury.  Patient reports that he was using his first 3 fingers to unscrew something in a small area and felt a pop to his third finger knuckle.  He reports that he has had pain ever since.  He reports that he is able to flex and extend his finger normally.  No paresthesias.  Patient Active Problem List   Diagnosis Date Noted   COVID 04/24/2022   Acute esophagitis    Columnar-lined esophagus    Hiatal hernia    Hyperlipidemia, mixed 07/01/2021   Insomnia disorder 07/01/2021   Environmental and seasonal allergies 07/01/2021   Esophageal dysphagia    Duodenal erythema    Esophagitis    Esophageal stenosis    Severe persistent asthma without complication 93/81/8299   Closed dislocation of tarsal joint of right foot 02/09/2021   Migraines 12/21/2017          Physical Exam   Triage Vital Signs: ED Triage Vitals  Enc Vitals Group     BP 10/04/22 1100 (!) 128/91     Pulse Rate 10/04/22 1100 78     Resp 10/04/22 1100 16     Temp 10/04/22 1100 98.2 F (36.8 C)     Temp Source 10/04/22 1100 Oral     SpO2 10/04/22 1100 95 %     Weight 10/04/22 1027 155 lb (70.3 kg)     Height 10/04/22 1027 5\' 4"  (1.626 m)     Head Circumference --      Peak Flow --      Pain Score 10/04/22 1027 2     Pain Loc --      Pain Edu? --      Excl. in [place]? --     Most recent vital signs: Vitals:   10/04/22 1100  BP: (!) 128/91  Pulse: 78  Resp: 16  Temp: 98.2 F (36.8 C)  SpO2: 95%    Physical Exam Vitals and nursing note reviewed.  Constitutional:      General: Awake and alert. No acute distress.    Appearance: Normal appearance. The patient is normal weight.  HENT:     Head: Normocephalic and atraumatic.      Mouth: Mucous membranes are moist.  Eyes:     General: PERRL. Normal EOMs        Right eye: No discharge.        Left eye: No discharge.     Conjunctiva/sclera: Conjunctivae normal.  Cardiovascular:     Rate and Rhythm: Normal rate and regular rhythm.     Pulses: Normal pulses.     Heart sounds: Normal heart sounds Pulmonary:     Effort: Pulmonary effort is normal. No respiratory distress.     Breath sounds: Normal breath sounds.  Abdominal:     Abdomen is soft. There is no abdominal tenderness. No rebound or guarding. No distention. Musculoskeletal:        General: No swelling. Normal range of motion.     Cervical back: Normal range of motion and neck supple.  Right hand: Tenderness with mild swelling over the MCP of the third finger.  Patient is able to flex and extend at isolated MCP, PIP, and DIP  of all fingers.  Able to abduct and adduct thumb normally.  Sensation intact light touch throughout finger.  Normal grip strength.  Normal intrinsic muscle function of the hand.  No open wounds. Skin:    General: Skin is warm and dry.     Capillary Refill: Capillary refill takes less than 2 seconds.     Findings: No rash.  Neurological:     Mental Status: The patient is awake and alert.      ED Results / Procedures / Treatments   Labs (all labs ordered are listed, but only abnormal results are displayed) Labs Reviewed - No data to display   EKG     RADIOLOGY I independently reviewed and interpreted imaging and agree with radiologists findings.     PROCEDURES:  Critical Care performed:   Procedures   MEDICATIONS ORDERED IN ED: Medications - No data to display   IMPRESSION / MDM / [place] / ED COURSE  I reviewed the triage vital signs and the nursing notes.   Differential diagnosis includes, but is not limited to, tendon injury, sprain, fracture, less likely dislocation.  Patient is awake and alert, hemodynamically stable and neurovascularly intact.   He is able to flex and extend all fingers at isolated PIP and DIP and MCP against resistance, do not suspect tendon rupture.  There is no finger lag.  X-ray was obtained in triage and is negative for any acute osseous findings.  Patient was reassured by this.  He was placed in a finger splint.  We discussed rest, ice, elevation, and follow-up with hand/ortho.  Patient understands and agrees with plan.  He was discharged in stable condition.   Patient's presentation is most consistent with acute complicated illness / injury requiring diagnostic workup.   FINAL CLINICAL IMPRESSION(S) / ED DIAGNOSES   Final diagnoses:  Hand pain, right     Rx / DC Orders   ED Discharge Orders     None        Note:  This document was prepared using Dragon voice recognition software and may include unintentional dictation errors.   Emeline Gins 10/04/22 1212    Lavonia Drafts, MD 10/04/22 [phone]

## 2022-10-06 ENCOUNTER — Telehealth: Payer: Self-pay

## 2022-10-06 NOTE — Telephone Encounter (Signed)
Transition Care Management Follow-up Telephone Call Date of discharge and from where: 02/03/2024[facility] How have you been since you were released from the hospital? "Fine"- finger in splint Any questions or concerns? No  Items Reviewed: Did the pt receive and understand the discharge instructions provided? Yes  Medications obtained and verified? Yes  Other? No  Any new allergies since your discharge? No  Dietary orders reviewed? Yes Do you have support at home? Yes   Home Care and Equipment/Supplies: none  Functional Questionnaire: (I = Independent and D = Dependent) ADLs: I  Bathing/Dressing- I  Meal Prep- I  Eating- I  Maintaining continence- I  Transferring/Ambulation- I  Managing Meds- I  Follow up appointments reviewed:  PCP Hospital f/u appt confirmed? Yes  Scheduled to see Dr Ronnald Ramp on 10/09/22 @ 300. [facility] f/u appt confirmed? No need Are transportation arrangements needed? No  If their condition worsens, is the pt aware to call PCP or go to the Emergency Dept.? Yes Was the patient provided with contact information for the PCP's office or ED? Yes Was to pt encouraged to call back with questions or concerns? Yes

## 2022-10-06 NOTE — Telephone Encounter (Signed)
Transition Care Management Unsuccessful Follow-up Telephone Call  Date of discharge and from where:  02/03/2024[facility]  Attempts:  1st Attempt  Reason for unsuccessful TCM follow-up call:  Left voice message

## 2022-10-07 DIAGNOSIS — J301 Allergic rhinitis due to pollen: Secondary | ICD-10-CM | POA: Diagnosis not present

## 2022-10-08 DIAGNOSIS — M79671 Pain in right foot: Secondary | ICD-10-CM | POA: Diagnosis not present

## 2022-10-08 DIAGNOSIS — M19071 Primary osteoarthritis, right ankle and foot: Secondary | ICD-10-CM | POA: Diagnosis not present

## 2022-10-08 DIAGNOSIS — M6281 Muscle weakness (generalized): Secondary | ICD-10-CM | POA: Diagnosis not present

## 2022-10-08 DIAGNOSIS — R262 Difficulty in walking, not elsewhere classified: Secondary | ICD-10-CM | POA: Diagnosis not present

## 2022-10-09 ENCOUNTER — Encounter: Payer: Self-pay | Admitting: Family Medicine

## 2022-10-09 ENCOUNTER — Ambulatory Visit: Payer: Medicare PPO | Admitting: Family Medicine

## 2022-10-09 VITALS — BP 118/78 | HR 92 | Ht 64.0 in | Wt 155.0 lb

## 2022-10-09 DIAGNOSIS — M79641 Pain in right hand: Secondary | ICD-10-CM | POA: Diagnosis not present

## 2022-10-09 DIAGNOSIS — S63653D Sprain of metacarpophalangeal joint of left middle finger, subsequent encounter: Secondary | ICD-10-CM | POA: Diagnosis not present

## 2022-10-09 NOTE — Addendum Note (Signed)
Addended by: Juline Patch on: 10/09/2022 03:46 PM   Modules accepted: Level of Service

## 2022-10-09 NOTE — Progress Notes (Addendum)
Date:  10/09/2022   Name:  Blake Patrick   DOB:  1951/08/20   MRN:  [id number]   Chief Complaint: Hospitalization Follow-up (Hand pain on 10/04/22. TOC placed on 10/06/22)  Hand Injury  The incident occurred 3 to 5 days ago. The incident occurred at home. Injury mechanism: screw driver. The pain is present in the right fingers (middle finger). The pain is at a severity of 2/10. The pain is mild. The pain has been Fluctuating (mahke a fist) since the incident. Pertinent negatives include no chest pain, muscle weakness, numbness or tingling. The symptoms are aggravated by movement. The treatment provided moderate relief.    Lab Results  Component Value Date   NA 139 07/09/2022   K 4.9 07/09/2022   CO2 23 07/09/2022   GLUCOSE 121 (H) 07/09/2022   BUN 24 07/09/2022   CREATININE 1.09 07/09/2022   CALCIUM 9.6 07/09/2022   EGFR 73 07/09/2022   GFRNONAA >60 04/02/2021   Lab Results  Component Value Date   CHOL 186 07/09/2022   HDL 62 07/09/2022   LDLCALC 101 (H) 07/09/2022   TRIG 133 07/09/2022   Lab Results  Component Value Date   TSH 0.69 07/08/2018   Lab Results  Component Value Date   HGBA1C 5.4 04/06/2019   Lab Results  Component Value Date   WBC 5.6 04/02/2021   HGB 14.4 04/02/2021   HCT 40.8 04/02/2021   MCV 93.6 04/02/2021   PLT 238 04/02/2021   Lab Results  Component Value Date   ALT 21 07/09/2022   AST 15 07/09/2022   ALKPHOS 98 07/09/2022   BILITOT 0.3 07/09/2022   No results found for: "25OHVITD2", "25OHVITD3", "VD25OH"   Review of Systems  Constitutional:  Negative for chills and fever.  HENT:  Negative for drooling, ear discharge, ear pain and sore throat.   Respiratory:  Negative for cough, shortness of breath and wheezing.   Cardiovascular:  Negative for chest pain, palpitations and leg swelling.  Gastrointestinal:  Negative for abdominal pain, blood in stool, constipation, diarrhea and nausea.  Endocrine: Negative for polydipsia.  Genitourinary:   Negative for dysuria, frequency, hematuria and urgency.  Musculoskeletal:  Negative for back pain, myalgias and neck pain.  Skin:  Negative for rash.  Allergic/Immunologic: Negative for environmental allergies.  Neurological:  Negative for dizziness, tingling, numbness and headaches.  Hematological:  Does not bruise/bleed easily.  Psychiatric/Behavioral:  Negative for suicidal ideas. The patient is not nervous/anxious.     Patient Active Problem List   Diagnosis Date Noted   COVID 04/24/2022   Acute esophagitis    Columnar-lined esophagus    Hiatal hernia    Hyperlipidemia, mixed 07/01/2021   Insomnia disorder 07/01/2021   Environmental and seasonal allergies 07/01/2021   Esophageal dysphagia    Duodenal erythema    Esophagitis    Esophageal stenosis    Severe persistent asthma without complication 19/50/9326   Closed dislocation of tarsal joint of right foot 02/09/2021   Migraines 12/21/2017    No Known Allergies  Past Surgical History:  Procedure Laterality Date   BIOPSY N/A 04/29/2021   Procedure: BIOPSY;  Surgeon: Virgel Manifold, MD;  Location: [place];  Service: Endoscopy;  Laterality: N/A;   ELBOW SURGERY Right 2007   ESOPHAGOGASTRODUODENOSCOPY (EGD) WITH PROPOFOL N/A 04/29/2021   Procedure: ESOPHAGOGASTRODUODENOSCOPY (EGD) WITH PROPOFOL;  Surgeon: Virgel Manifold, MD;  Location: [place];  Service: Endoscopy;  Laterality: N/A;   ESOPHAGOGASTRODUODENOSCOPY (EGD) WITH PROPOFOL N/A 07/22/2021  Procedure: ESOPHAGOGASTRODUODENOSCOPY (EGD) WITH PROPOFOL;  Surgeon: Virgel Manifold, MD;  Location: [place];  Service: Endoscopy;  Laterality: N/A;   ETHMOIDECTOMY Bilateral 10/27/2019   Procedure: TOTAL ETHMOIDECTOMY WITH FRONTAL SINUOTOMY;  Surgeon: Margaretha Sheffield, MD;  Location: [place];  Service: ENT;  Laterality: Bilateral;   EYE SURGERY  2019   Replaced   HAND [place]   IMAGE GUIDED  SINUS SURGERY Bilateral 10/27/2019   Procedure: IMAGE GUIDED SINUS SURGERY;  Surgeon: Margaretha Sheffield, MD;  Location: [place];  Service: ENT;  Laterality: Bilateral;  need stryker disk placed disk on or charge nurse desk 2-17   [place] Bilateral 04/26/2020   Procedure: IMAGE GUIDED SINUS SURGERY;  Surgeon: Margaretha Sheffield, MD;  Location: [place];  Service: ENT;  Laterality: Bilateral;  need stryker disk gave Hassan Rowan disk on 8-23 kp   MENISCUS REPAIR Left 2018   SEPTOPLASTY Bilateral 10/27/2019   Procedure: SEPTOPLASTY REVISION;  Surgeon: Margaretha Sheffield, MD;  Location: [place];  Service: ENT;  Laterality: Bilateral;   SPHENOIDECTOMY Right 04/26/2020   Procedure: Coralee Pesa;  Surgeon: Margaretha Sheffield, MD;  Location: [place];  Service: ENT;  Laterality: Right;   TONSILLECTOMY  1964    Social History   Tobacco Use   Smoking status: Former    Packs/day: 1.00    Years: 26.00    Total pack years: 26.00    Types: Cigarettes    Quit date: 09/01/1988    Years since quitting: 34.1   Smokeless tobacco: Never  Vaping Use   Vaping Use: Never used  Substance Use Topics   Alcohol use: Yes    Alcohol/week: 7.0 standard drinks of alcohol    Types: 7 Cans of beer per week    Comment: Social drinking only   Drug use: Never     Medication list has been reviewed and updated.  Current Meds  Medication Sig   albuterol (PROVENTIL) (2.5 MG/3ML) 0.083% nebulizer solution Take 2.5 mg by nebulization every 2 (two) hours as needed.   APPLE CIDER VINEGAR PO Take 500 mg by mouth.   Calcium Carbonate-Vitamin D (RA CALCIUM PLUS VITAMIN D PO) Take 525 mg by mouth 2 (two) times daily.   Cholecalciferol (VITAMIN D) 50 MCG (2000 UT) CAPS Take 1 capsule by mouth daily.   Coenzyme Q10 (COQ-10) 100 MG CAPS Take 1 each by mouth 2 (two) times daily.   DULoxetine (CYMBALTA) 20 MG capsule Take 60 mg by mouth 2 (two) times daily. Dr Sofie Hartigan    EPINEPHrine 0.3 mg/0.3 mL IJ SOAJ injection    fexofenadine (ALLEGRA) 180 MG tablet Take 180 mg by mouth daily.   fluticasone (FLONASE) 50 MCG/ACT nasal spray Place 2 sprays into both nostrils daily.   Glucosamine-Chondroitin (GLUCOSAMINE CHONDR COMPLEX PO) Take 3,700 mg by mouth daily.   ibuprofen (ADVIL) 600 MG tablet Take 600 mg by mouth every 6 (six) hours as needed.   Lasmiditan Succinate (REYVOW) 100 MG TABS Take by mouth.   montelukast (SINGULAIR) 10 MG tablet Take 1 tablet by mouth daily. Dr A.   naratriptan (AMERGE) 2.5 MG tablet as needed.   omeprazole (PRILOSEC) 20 MG capsule Take 1 capsule (20 mg total) by mouth daily.   polycarbophil (FIBERCON) 625 MG tablet Take 1,250 mg by mouth 2 (two) times daily.   Riboflavin 400 MG TABS Take 1 tablet by mouth daily.   rosuvastatin (CRESTOR) 10 MG tablet  Take 1 tablet daily   traZODone (DESYREL) 50 MG tablet TAKE 1 TO 2 TABLETS AT BEDTIME AS NEEDED FOR SLEEP   vitamin C (ASCORBIC ACID) 500 MG tablet Take 500 mg by mouth daily.   Vitamin E 180 MG CAPS Take 1 capsule by mouth daily.   Vitamins-Lipotropics (LIPO-FLAVONOID PLUS PO) Take 1,000 mg by mouth 2 (two) times daily.   Zinc 50 MG CAPS Take 1 capsule by mouth daily.       10/09/2022    3:22 PM 07/09/2022    8:41 AM 04/24/2022    8:13 AM 01/06/2022    8:19 AM  GAD 7 : Generalized Anxiety Score  Nervous, Anxious, on Edge 0 0 0 0  Control/stop worrying 0 0 0 0  Worry too much - different things 0 0 0 0  Trouble relaxing 0 0 0 0  Restless 0 0 0 0  Easily annoyed or irritable 0 0 0 0  Afraid - awful might happen 0 0 0 0  Total GAD 7 Score 0 0 0 0  Anxiety Difficulty Not difficult at all Not difficult at all Not difficult at all Not difficult at all       10/09/2022    3:21 PM 07/09/2022    8:41 AM 06/25/2022    8:50 AM  Depression screen PHQ 2/9  Decreased Interest 0 0 0  Down, Depressed, Hopeless 0 0 0  PHQ - 2 Score 0 0 0  Altered sleeping 0 0 1  Tired, decreased energy 0 0 0   Change in appetite 0 0 0  Feeling bad or failure about yourself  0 0 0  Trouble concentrating 0 0 0  Moving slowly or fidgety/restless 0 0 0  Suicidal thoughts 0 0 0  PHQ-9 Score 0 0 1  Difficult doing work/chores Not difficult at all Not difficult at all Not difficult at all    BP Readings from Last 3 Encounters:  10/09/22 118/78  10/04/22 (!) 128/91  07/22/22 121/80    Physical Exam Vitals and nursing note reviewed.  HENT:     Head: Normocephalic.     Right Ear: External ear normal.     Left Ear: External ear normal.     Nose: Nose normal.     Mouth/Throat:     Mouth: Mucous membranes are moist.  Eyes:     General: No scleral icterus.       Right eye: No discharge.        Left eye: No discharge.     Conjunctiva/sclera: Conjunctivae normal.     Pupils: Pupils are equal, round, and reactive to light.  Neck:     Thyroid: No thyromegaly.     Vascular: No JVD.     Trachea: No tracheal deviation.  Cardiovascular:     Rate and Rhythm: Normal rate and regular rhythm.     Heart sounds: Normal heart sounds. No murmur heard.    No friction rub. No gallop.  Pulmonary:     Effort: No respiratory distress.     Breath sounds: Normal breath sounds. No wheezing, rhonchi or rales.  Abdominal:     General: Bowel sounds are normal.     Palpations: Abdomen is soft. There is no mass.     Tenderness: There is no abdominal tenderness. There is no guarding or rebound.  Musculoskeletal:        General: Normal range of motion.     Right hand: Tenderness present. No swelling or deformity. Normal  range of motion. Normal strength. Normal sensation. There is no disruption of two-point discrimination.     Cervical back: Normal range of motion and neck supple.  Lymphadenopathy:     Cervical: No cervical adenopathy.  Skin:    General: Skin is warm.     Findings: No rash.  Neurological:     Mental Status: He is alert and oriented to person, place, and time.     Cranial Nerves: No cranial  nerve deficit.     Deep Tendon Reflexes: Reflexes are normal and symmetric.     Wt Readings from Last 3 Encounters:  10/09/22 155 lb (70.3 kg)  10/04/22 155 lb (70.3 kg)  07/09/22 154 lb (69.9 kg)    BP 118/78   Pulse 92   Ht 5\' 4"  (1.626 m)   Wt 155 lb (70.3 kg)   BMI 26.61 kg/m   Assessment and Plan: Patient presents for follow-up from emergency room visit from 7-day upon evaluation on 10/04/2022.  Patient was discharged with diagnosis of hand pain 1. Sprain of metacarpophalangeal (MCP) joint of left middle finger, subsequent encounter New onset.  Injury occurred on Saturday night with swelling the following day Sunday which was evaluated in emergency room x-ray was noted to be negative for fracture or dislocation.  Patient has tenderness of the third MP joint with no swelling.  Full range of motion.  Mechanism of injury was suggested patient may dislocated his CMP and then reduced it on his own when he extended the hand.  Patient's been encouraged to ice it when discomfort and does not seem to need to splint at this time.  Patient will call if there is no resolution of the pain in a couple of weeks.     Otilio Miu, MD

## 2022-10-10 DIAGNOSIS — R262 Difficulty in walking, not elsewhere classified: Secondary | ICD-10-CM | POA: Diagnosis not present

## 2022-10-10 DIAGNOSIS — M79671 Pain in right foot: Secondary | ICD-10-CM | POA: Diagnosis not present

## 2022-10-10 DIAGNOSIS — M19071 Primary osteoarthritis, right ankle and foot: Secondary | ICD-10-CM | POA: Diagnosis not present

## 2022-10-10 DIAGNOSIS — M6281 Muscle weakness (generalized): Secondary | ICD-10-CM | POA: Diagnosis not present

## 2022-10-14 DIAGNOSIS — J301 Allergic rhinitis due to pollen: Secondary | ICD-10-CM | POA: Diagnosis not present

## 2022-10-15 DIAGNOSIS — M79671 Pain in right foot: Secondary | ICD-10-CM | POA: Diagnosis not present

## 2022-10-15 DIAGNOSIS — M6281 Muscle weakness (generalized): Secondary | ICD-10-CM | POA: Diagnosis not present

## 2022-10-15 DIAGNOSIS — R262 Difficulty in walking, not elsewhere classified: Secondary | ICD-10-CM | POA: Diagnosis not present

## 2022-10-15 DIAGNOSIS — M19071 Primary osteoarthritis, right ankle and foot: Secondary | ICD-10-CM | POA: Diagnosis not present

## 2022-10-17 DIAGNOSIS — R262 Difficulty in walking, not elsewhere classified: Secondary | ICD-10-CM | POA: Diagnosis not present

## 2022-10-17 DIAGNOSIS — M79671 Pain in right foot: Secondary | ICD-10-CM | POA: Diagnosis not present

## 2022-10-17 DIAGNOSIS — M19071 Primary osteoarthritis, right ankle and foot: Secondary | ICD-10-CM | POA: Diagnosis not present

## 2022-10-17 DIAGNOSIS — M6281 Muscle weakness (generalized): Secondary | ICD-10-CM | POA: Diagnosis not present

## 2022-10-21 DIAGNOSIS — J301 Allergic rhinitis due to pollen: Secondary | ICD-10-CM | POA: Diagnosis not present

## 2022-10-21 DIAGNOSIS — M6281 Muscle weakness (generalized): Secondary | ICD-10-CM | POA: Diagnosis not present

## 2022-10-21 DIAGNOSIS — M79671 Pain in right foot: Secondary | ICD-10-CM | POA: Diagnosis not present

## 2022-10-21 DIAGNOSIS — R262 Difficulty in walking, not elsewhere classified: Secondary | ICD-10-CM | POA: Diagnosis not present

## 2022-10-21 DIAGNOSIS — M19071 Primary osteoarthritis, right ankle and foot: Secondary | ICD-10-CM | POA: Diagnosis not present

## 2022-10-22 DIAGNOSIS — M791 Myalgia, unspecified site: Secondary | ICD-10-CM | POA: Diagnosis not present

## 2022-10-23 DIAGNOSIS — M79671 Pain in right foot: Secondary | ICD-10-CM | POA: Diagnosis not present

## 2022-10-23 DIAGNOSIS — R262 Difficulty in walking, not elsewhere classified: Secondary | ICD-10-CM | POA: Diagnosis not present

## 2022-10-23 DIAGNOSIS — M6281 Muscle weakness (generalized): Secondary | ICD-10-CM | POA: Diagnosis not present

## 2022-10-23 DIAGNOSIS — M19071 Primary osteoarthritis, right ankle and foot: Secondary | ICD-10-CM | POA: Diagnosis not present

## 2022-10-28 DIAGNOSIS — J301 Allergic rhinitis due to pollen: Secondary | ICD-10-CM | POA: Diagnosis not present

## 2022-10-28 DIAGNOSIS — M19071 Primary osteoarthritis, right ankle and foot: Secondary | ICD-10-CM | POA: Diagnosis not present

## 2022-10-28 DIAGNOSIS — M6281 Muscle weakness (generalized): Secondary | ICD-10-CM | POA: Diagnosis not present

## 2022-10-28 DIAGNOSIS — R262 Difficulty in walking, not elsewhere classified: Secondary | ICD-10-CM | POA: Diagnosis not present

## 2022-10-28 DIAGNOSIS — M79671 Pain in right foot: Secondary | ICD-10-CM | POA: Diagnosis not present

## 2022-10-31 DIAGNOSIS — R262 Difficulty in walking, not elsewhere classified: Secondary | ICD-10-CM | POA: Diagnosis not present

## 2022-10-31 DIAGNOSIS — M19071 Primary osteoarthritis, right ankle and foot: Secondary | ICD-10-CM | POA: Diagnosis not present

## 2022-10-31 DIAGNOSIS — M79671 Pain in right foot: Secondary | ICD-10-CM | POA: Diagnosis not present

## 2022-10-31 DIAGNOSIS — M6281 Muscle weakness (generalized): Secondary | ICD-10-CM | POA: Diagnosis not present

## 2022-11-03 DIAGNOSIS — R030 Elevated blood-pressure reading, without diagnosis of hypertension: Secondary | ICD-10-CM | POA: Diagnosis not present

## 2022-11-03 DIAGNOSIS — M79671 Pain in right foot: Secondary | ICD-10-CM | POA: Diagnosis not present

## 2022-11-03 DIAGNOSIS — Z981 Arthrodesis status: Secondary | ICD-10-CM | POA: Diagnosis not present

## 2022-11-04 DIAGNOSIS — J301 Allergic rhinitis due to pollen: Secondary | ICD-10-CM | POA: Diagnosis not present

## 2022-11-04 DIAGNOSIS — M6281 Muscle weakness (generalized): Secondary | ICD-10-CM | POA: Diagnosis not present

## 2022-11-04 DIAGNOSIS — R262 Difficulty in walking, not elsewhere classified: Secondary | ICD-10-CM | POA: Diagnosis not present

## 2022-11-04 DIAGNOSIS — M79671 Pain in right foot: Secondary | ICD-10-CM | POA: Diagnosis not present

## 2022-11-04 DIAGNOSIS — M19071 Primary osteoarthritis, right ankle and foot: Secondary | ICD-10-CM | POA: Diagnosis not present

## 2022-11-07 DIAGNOSIS — M6281 Muscle weakness (generalized): Secondary | ICD-10-CM | POA: Diagnosis not present

## 2022-11-07 DIAGNOSIS — R262 Difficulty in walking, not elsewhere classified: Secondary | ICD-10-CM | POA: Diagnosis not present

## 2022-11-07 DIAGNOSIS — M79671 Pain in right foot: Secondary | ICD-10-CM | POA: Diagnosis not present

## 2022-11-07 DIAGNOSIS — M19071 Primary osteoarthritis, right ankle and foot: Secondary | ICD-10-CM | POA: Diagnosis not present

## 2022-11-11 DIAGNOSIS — M19071 Primary osteoarthritis, right ankle and foot: Secondary | ICD-10-CM | POA: Diagnosis not present

## 2022-11-11 DIAGNOSIS — W11XXXD Fall on and from ladder, subsequent encounter: Secondary | ICD-10-CM | POA: Diagnosis not present

## 2022-11-11 DIAGNOSIS — J301 Allergic rhinitis due to pollen: Secondary | ICD-10-CM | POA: Diagnosis not present

## 2022-11-11 DIAGNOSIS — S93314D Dislocation of tarsal joint of right foot, subsequent encounter: Secondary | ICD-10-CM | POA: Diagnosis not present

## 2022-11-11 DIAGNOSIS — M85871 Other specified disorders of bone density and structure, right ankle and foot: Secondary | ICD-10-CM | POA: Diagnosis not present

## 2022-11-11 DIAGNOSIS — Z981 Arthrodesis status: Secondary | ICD-10-CM | POA: Diagnosis not present

## 2022-11-11 DIAGNOSIS — M24571 Contracture, right ankle: Secondary | ICD-10-CM | POA: Diagnosis not present

## 2022-11-11 DIAGNOSIS — T8484XA Pain due to internal orthopedic prosthetic devices, implants and grafts, initial encounter: Secondary | ICD-10-CM | POA: Diagnosis not present

## 2022-11-12 DIAGNOSIS — M6281 Muscle weakness (generalized): Secondary | ICD-10-CM | POA: Diagnosis not present

## 2022-11-12 DIAGNOSIS — M19071 Primary osteoarthritis, right ankle and foot: Secondary | ICD-10-CM | POA: Diagnosis not present

## 2022-11-12 DIAGNOSIS — M79671 Pain in right foot: Secondary | ICD-10-CM | POA: Diagnosis not present

## 2022-11-12 DIAGNOSIS — R262 Difficulty in walking, not elsewhere classified: Secondary | ICD-10-CM | POA: Diagnosis not present

## 2022-11-14 DIAGNOSIS — M6281 Muscle weakness (generalized): Secondary | ICD-10-CM | POA: Diagnosis not present

## 2022-11-14 DIAGNOSIS — M19071 Primary osteoarthritis, right ankle and foot: Secondary | ICD-10-CM | POA: Diagnosis not present

## 2022-11-14 DIAGNOSIS — R262 Difficulty in walking, not elsewhere classified: Secondary | ICD-10-CM | POA: Diagnosis not present

## 2022-11-14 DIAGNOSIS — M79671 Pain in right foot: Secondary | ICD-10-CM | POA: Diagnosis not present

## 2022-11-18 DIAGNOSIS — M19071 Primary osteoarthritis, right ankle and foot: Secondary | ICD-10-CM | POA: Diagnosis not present

## 2022-11-18 DIAGNOSIS — R262 Difficulty in walking, not elsewhere classified: Secondary | ICD-10-CM | POA: Diagnosis not present

## 2022-11-18 DIAGNOSIS — M6281 Muscle weakness (generalized): Secondary | ICD-10-CM | POA: Diagnosis not present

## 2022-11-18 DIAGNOSIS — J301 Allergic rhinitis due to pollen: Secondary | ICD-10-CM | POA: Diagnosis not present

## 2022-11-18 DIAGNOSIS — M79671 Pain in right foot: Secondary | ICD-10-CM | POA: Diagnosis not present

## 2022-11-20 DIAGNOSIS — M79671 Pain in right foot: Secondary | ICD-10-CM | POA: Diagnosis not present

## 2022-11-20 DIAGNOSIS — M19071 Primary osteoarthritis, right ankle and foot: Secondary | ICD-10-CM | POA: Diagnosis not present

## 2022-11-20 DIAGNOSIS — M6281 Muscle weakness (generalized): Secondary | ICD-10-CM | POA: Diagnosis not present

## 2022-11-20 DIAGNOSIS — R262 Difficulty in walking, not elsewhere classified: Secondary | ICD-10-CM | POA: Diagnosis not present

## 2022-11-25 DIAGNOSIS — J301 Allergic rhinitis due to pollen: Secondary | ICD-10-CM | POA: Diagnosis not present

## 2022-11-25 DIAGNOSIS — M19071 Primary osteoarthritis, right ankle and foot: Secondary | ICD-10-CM | POA: Diagnosis not present

## 2022-11-25 DIAGNOSIS — R262 Difficulty in walking, not elsewhere classified: Secondary | ICD-10-CM | POA: Diagnosis not present

## 2022-11-25 DIAGNOSIS — M79671 Pain in right foot: Secondary | ICD-10-CM | POA: Diagnosis not present

## 2022-11-25 DIAGNOSIS — M6281 Muscle weakness (generalized): Secondary | ICD-10-CM | POA: Diagnosis not present

## 2022-11-27 DIAGNOSIS — M79671 Pain in right foot: Secondary | ICD-10-CM | POA: Diagnosis not present

## 2022-11-27 DIAGNOSIS — R262 Difficulty in walking, not elsewhere classified: Secondary | ICD-10-CM | POA: Diagnosis not present

## 2022-11-27 DIAGNOSIS — M19071 Primary osteoarthritis, right ankle and foot: Secondary | ICD-10-CM | POA: Diagnosis not present

## 2022-11-27 DIAGNOSIS — M6281 Muscle weakness (generalized): Secondary | ICD-10-CM | POA: Diagnosis not present

## 2022-12-01 DIAGNOSIS — M19071 Primary osteoarthritis, right ankle and foot: Secondary | ICD-10-CM | POA: Diagnosis not present

## 2022-12-01 DIAGNOSIS — M79671 Pain in right foot: Secondary | ICD-10-CM | POA: Diagnosis not present

## 2022-12-01 DIAGNOSIS — M6281 Muscle weakness (generalized): Secondary | ICD-10-CM | POA: Diagnosis not present

## 2022-12-01 DIAGNOSIS — R262 Difficulty in walking, not elsewhere classified: Secondary | ICD-10-CM | POA: Diagnosis not present

## 2022-12-02 DIAGNOSIS — J301 Allergic rhinitis due to pollen: Secondary | ICD-10-CM | POA: Diagnosis not present

## 2022-12-04 DIAGNOSIS — M6281 Muscle weakness (generalized): Secondary | ICD-10-CM | POA: Diagnosis not present

## 2022-12-04 DIAGNOSIS — M19071 Primary osteoarthritis, right ankle and foot: Secondary | ICD-10-CM | POA: Diagnosis not present

## 2022-12-04 DIAGNOSIS — R262 Difficulty in walking, not elsewhere classified: Secondary | ICD-10-CM | POA: Diagnosis not present

## 2022-12-04 DIAGNOSIS — M79671 Pain in right foot: Secondary | ICD-10-CM | POA: Diagnosis not present

## 2022-12-09 DIAGNOSIS — M79671 Pain in right foot: Secondary | ICD-10-CM | POA: Diagnosis not present

## 2022-12-09 DIAGNOSIS — J301 Allergic rhinitis due to pollen: Secondary | ICD-10-CM | POA: Diagnosis not present

## 2022-12-09 DIAGNOSIS — D485 Neoplasm of uncertain behavior of skin: Secondary | ICD-10-CM | POA: Diagnosis not present

## 2022-12-09 DIAGNOSIS — L578 Other skin changes due to chronic exposure to nonionizing radiation: Secondary | ICD-10-CM | POA: Diagnosis not present

## 2022-12-09 DIAGNOSIS — R262 Difficulty in walking, not elsewhere classified: Secondary | ICD-10-CM | POA: Diagnosis not present

## 2022-12-09 DIAGNOSIS — M19071 Primary osteoarthritis, right ankle and foot: Secondary | ICD-10-CM | POA: Diagnosis not present

## 2022-12-09 DIAGNOSIS — Z859 Personal history of malignant neoplasm, unspecified: Secondary | ICD-10-CM | POA: Diagnosis not present

## 2022-12-09 DIAGNOSIS — L814 Other melanin hyperpigmentation: Secondary | ICD-10-CM | POA: Diagnosis not present

## 2022-12-09 DIAGNOSIS — Z872 Personal history of diseases of the skin and subcutaneous tissue: Secondary | ICD-10-CM | POA: Diagnosis not present

## 2022-12-09 DIAGNOSIS — M6281 Muscle weakness (generalized): Secondary | ICD-10-CM | POA: Diagnosis not present

## 2022-12-11 DIAGNOSIS — M19071 Primary osteoarthritis, right ankle and foot: Secondary | ICD-10-CM | POA: Diagnosis not present

## 2022-12-11 DIAGNOSIS — M6281 Muscle weakness (generalized): Secondary | ICD-10-CM | POA: Diagnosis not present

## 2022-12-11 DIAGNOSIS — M79671 Pain in right foot: Secondary | ICD-10-CM | POA: Diagnosis not present

## 2022-12-11 DIAGNOSIS — R262 Difficulty in walking, not elsewhere classified: Secondary | ICD-10-CM | POA: Diagnosis not present

## 2022-12-16 DIAGNOSIS — M79671 Pain in right foot: Secondary | ICD-10-CM | POA: Diagnosis not present

## 2022-12-16 DIAGNOSIS — J301 Allergic rhinitis due to pollen: Secondary | ICD-10-CM | POA: Diagnosis not present

## 2022-12-16 DIAGNOSIS — R262 Difficulty in walking, not elsewhere classified: Secondary | ICD-10-CM | POA: Diagnosis not present

## 2022-12-16 DIAGNOSIS — M6281 Muscle weakness (generalized): Secondary | ICD-10-CM | POA: Diagnosis not present

## 2022-12-16 DIAGNOSIS — M19071 Primary osteoarthritis, right ankle and foot: Secondary | ICD-10-CM | POA: Diagnosis not present

## 2022-12-18 DIAGNOSIS — R262 Difficulty in walking, not elsewhere classified: Secondary | ICD-10-CM | POA: Diagnosis not present

## 2022-12-18 DIAGNOSIS — M79671 Pain in right foot: Secondary | ICD-10-CM | POA: Diagnosis not present

## 2022-12-18 DIAGNOSIS — M6281 Muscle weakness (generalized): Secondary | ICD-10-CM | POA: Diagnosis not present

## 2022-12-18 DIAGNOSIS — M19071 Primary osteoarthritis, right ankle and foot: Secondary | ICD-10-CM | POA: Diagnosis not present

## 2022-12-23 DIAGNOSIS — M6281 Muscle weakness (generalized): Secondary | ICD-10-CM | POA: Diagnosis not present

## 2022-12-23 DIAGNOSIS — M19071 Primary osteoarthritis, right ankle and foot: Secondary | ICD-10-CM | POA: Diagnosis not present

## 2022-12-23 DIAGNOSIS — J301 Allergic rhinitis due to pollen: Secondary | ICD-10-CM | POA: Diagnosis not present

## 2022-12-23 DIAGNOSIS — M79671 Pain in right foot: Secondary | ICD-10-CM | POA: Diagnosis not present

## 2022-12-23 DIAGNOSIS — R262 Difficulty in walking, not elsewhere classified: Secondary | ICD-10-CM | POA: Diagnosis not present

## 2022-12-24 DIAGNOSIS — J301 Allergic rhinitis due to pollen: Secondary | ICD-10-CM | POA: Diagnosis not present

## 2022-12-24 DIAGNOSIS — M791 Myalgia, unspecified site: Secondary | ICD-10-CM | POA: Diagnosis not present

## 2022-12-24 DIAGNOSIS — R519 Headache, unspecified: Secondary | ICD-10-CM | POA: Diagnosis not present

## 2022-12-25 DIAGNOSIS — M6281 Muscle weakness (generalized): Secondary | ICD-10-CM | POA: Diagnosis not present

## 2022-12-25 DIAGNOSIS — M19071 Primary osteoarthritis, right ankle and foot: Secondary | ICD-10-CM | POA: Diagnosis not present

## 2022-12-25 DIAGNOSIS — M79671 Pain in right foot: Secondary | ICD-10-CM | POA: Diagnosis not present

## 2022-12-25 DIAGNOSIS — R262 Difficulty in walking, not elsewhere classified: Secondary | ICD-10-CM | POA: Diagnosis not present

## 2022-12-27 IMAGING — CR DG LUMBAR SPINE COMPLETE 4+V
6 series · 6 of 6 positions shown · non-contrast
Comparison: None.

CLINICAL DATA: leftflank/lateral lumbar pain/ recent history low
grade bladder cancer

EXAM:
LUMBAR SPINE - COMPLETE 4+ VIEW

[l-spine ap]
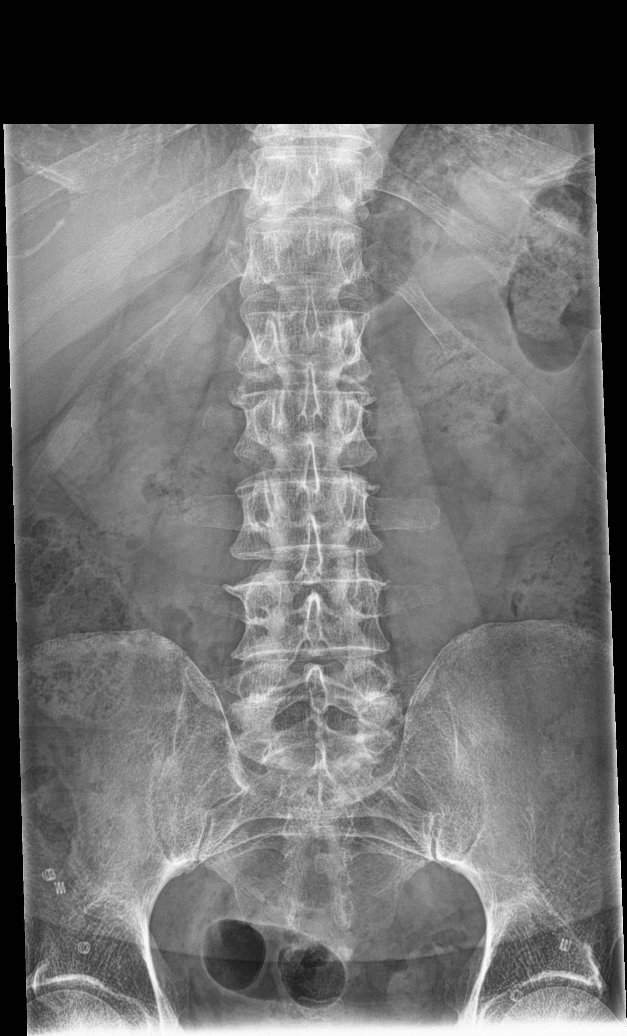

[l-spine obl (1 of 3)]
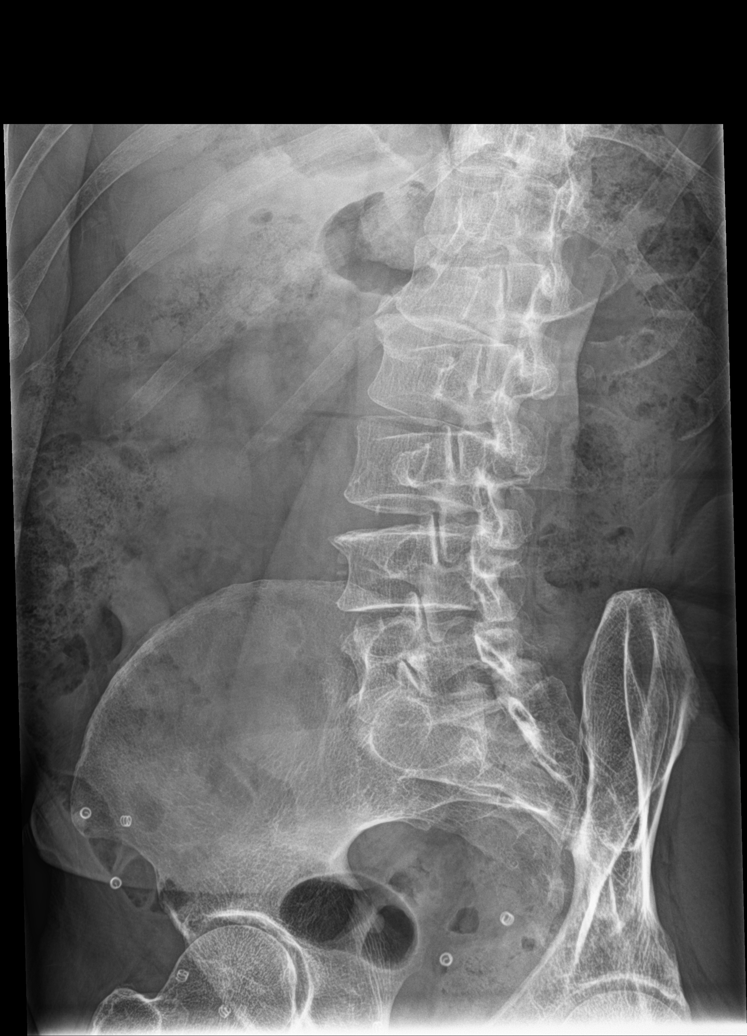

[l-spine obl (2 of 3)]
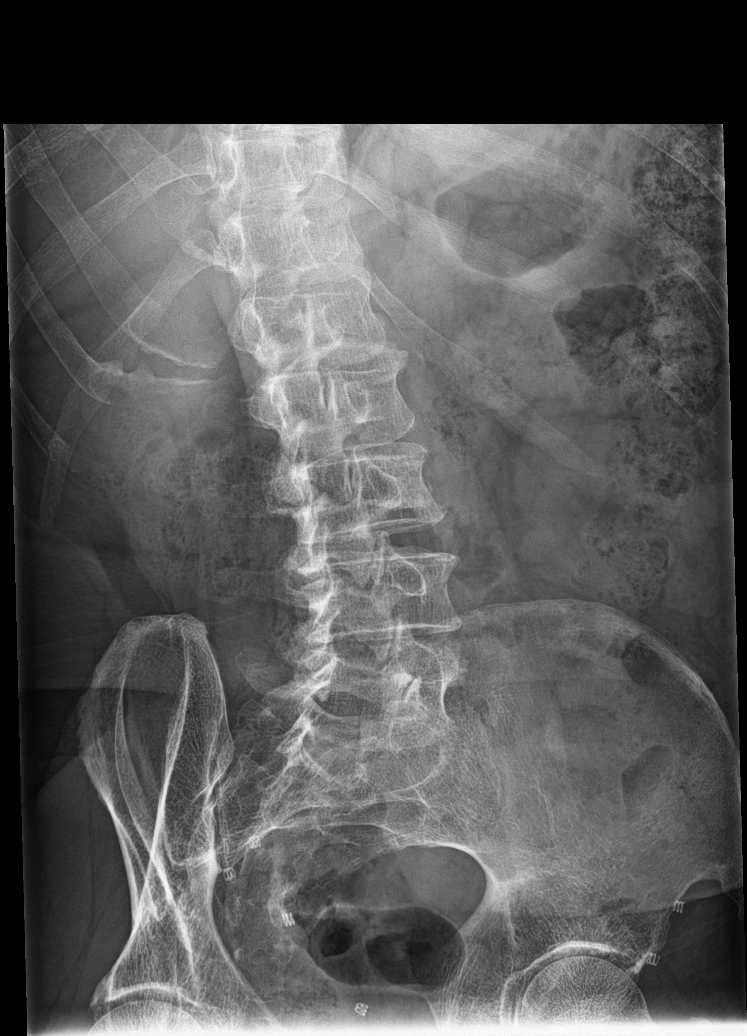

[l-spine lat]
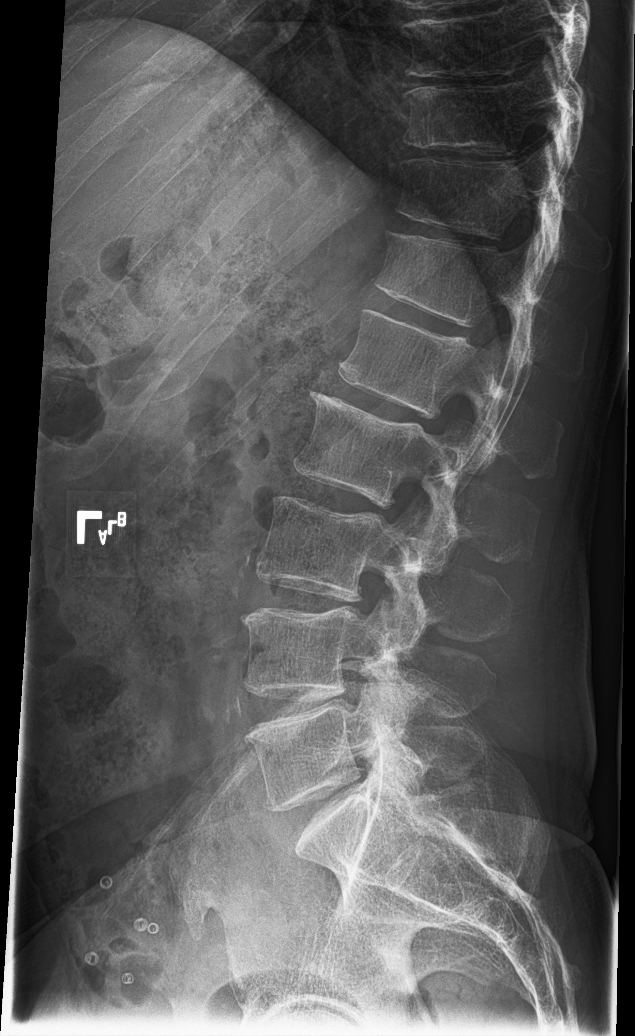

[l-spine spot]
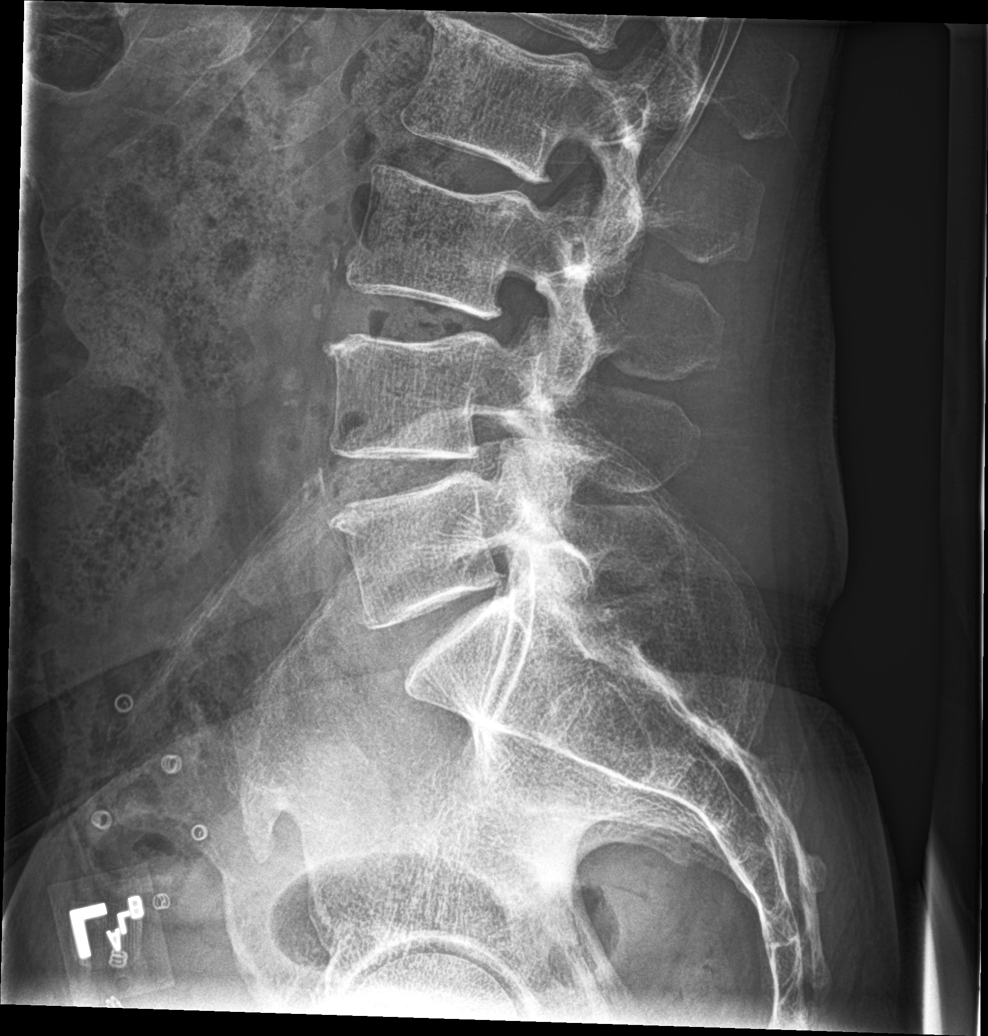

[l-spine obl (3 of 3)]
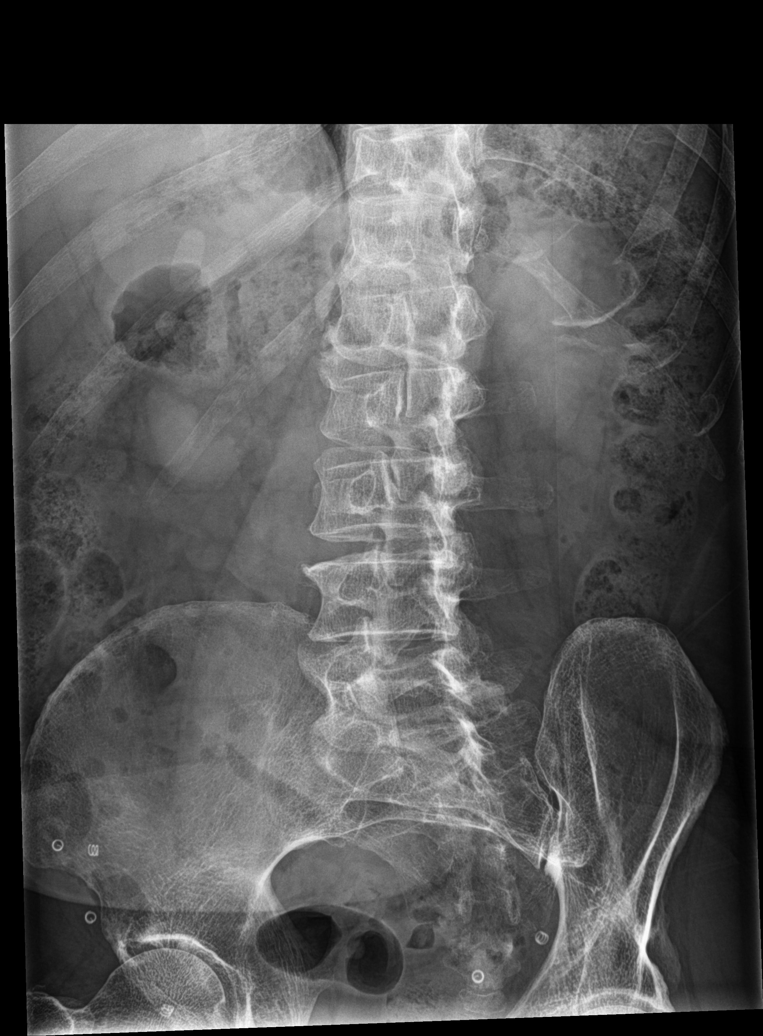

[6 of 6 positions shown; findings below may reference images not displayed]

FINDINGS: There are 5 non-rib-bearing lumbar-type vertebral bodies. Normal
alignment. No significant degenerative disc changes. Note is made of
mild anterior wedging of the T12 vertebral body with approximately
25% height loss. The visualized soft tissues demonstrate no acute
findings.
IMPRESSION: There is age-indeterminate mild anterior wedging of the T12
vertebral body as described. Correlate with tenderness on exam. The
lumbar spine otherwise demonstrates normal alignment without acute
findings.

## 2022-12-30 DIAGNOSIS — M19071 Primary osteoarthritis, right ankle and foot: Secondary | ICD-10-CM | POA: Diagnosis not present

## 2022-12-30 DIAGNOSIS — R262 Difficulty in walking, not elsewhere classified: Secondary | ICD-10-CM | POA: Diagnosis not present

## 2022-12-30 DIAGNOSIS — J301 Allergic rhinitis due to pollen: Secondary | ICD-10-CM | POA: Diagnosis not present

## 2022-12-30 DIAGNOSIS — M79671 Pain in right foot: Secondary | ICD-10-CM | POA: Diagnosis not present

## 2022-12-30 DIAGNOSIS — M6281 Muscle weakness (generalized): Secondary | ICD-10-CM | POA: Diagnosis not present

## 2023-01-01 DIAGNOSIS — R262 Difficulty in walking, not elsewhere classified: Secondary | ICD-10-CM | POA: Diagnosis not present

## 2023-01-01 DIAGNOSIS — M19071 Primary osteoarthritis, right ankle and foot: Secondary | ICD-10-CM | POA: Diagnosis not present

## 2023-01-01 DIAGNOSIS — M6281 Muscle weakness (generalized): Secondary | ICD-10-CM | POA: Diagnosis not present

## 2023-01-01 DIAGNOSIS — M79671 Pain in right foot: Secondary | ICD-10-CM | POA: Diagnosis not present

## 2023-01-06 DIAGNOSIS — M6281 Muscle weakness (generalized): Secondary | ICD-10-CM | POA: Diagnosis not present

## 2023-01-06 DIAGNOSIS — M79671 Pain in right foot: Secondary | ICD-10-CM | POA: Diagnosis not present

## 2023-01-06 DIAGNOSIS — R262 Difficulty in walking, not elsewhere classified: Secondary | ICD-10-CM | POA: Diagnosis not present

## 2023-01-06 DIAGNOSIS — J301 Allergic rhinitis due to pollen: Secondary | ICD-10-CM | POA: Diagnosis not present

## 2023-01-06 DIAGNOSIS — M19071 Primary osteoarthritis, right ankle and foot: Secondary | ICD-10-CM | POA: Diagnosis not present

## 2023-01-07 ENCOUNTER — Encounter: Payer: Self-pay | Admitting: Family Medicine

## 2023-01-07 ENCOUNTER — Ambulatory Visit (INDEPENDENT_AMBULATORY_CARE_PROVIDER_SITE_OTHER): Payer: Medicare PPO | Admitting: Family Medicine

## 2023-01-07 VITALS — BP 124/78 | HR 95 | Ht 64.0 in | Wt 155.0 lb

## 2023-01-07 DIAGNOSIS — E785 Hyperlipidemia, unspecified: Secondary | ICD-10-CM

## 2023-01-07 DIAGNOSIS — F5101 Primary insomnia: Secondary | ICD-10-CM

## 2023-01-07 DIAGNOSIS — R739 Hyperglycemia, unspecified: Secondary | ICD-10-CM | POA: Diagnosis not present

## 2023-01-07 MED ORDER — TRAZODONE HCL 50 MG PO TABS: ORAL_TABLET | ORAL | 1 refills | Status: DC

## 2023-01-07 MED ORDER — ROSUVASTATIN CALCIUM 10 MG PO TABS: ORAL_TABLET | ORAL | 1 refills | Status: DC

## 2023-01-07 NOTE — Progress Notes (Signed)
Date:  01/07/2023   Name:  Blake Patrick   DOB:  20-Mar-1951   MRN:  [id number]   Chief Complaint: Hyperlipidemia and Insomnia  Hyperlipidemia This is a chronic problem. The current episode started more than 1 year ago. The problem is controlled. Recent lipid tests were reviewed and are normal. He has no history of chronic renal disease. Pertinent negatives include no chest pain, leg pain, myalgias or shortness of breath. Current antihyperlipidemic treatment includes statins. The current treatment provides moderate improvement of lipids. There are no compliance problems.  Risk factors for coronary artery disease include dyslipidemia and hypertension.  Insomnia Primary symptoms: no fragmented sleep, no sleep disturbance, difficulty falling asleep, no somnolence, no frequent awakening, no premature morning awakening, no malaise/fatigue, no napping.   The problem occurs intermittently.    Lab Results  Component Value Date   NA 139 07/09/2022   K 4.9 07/09/2022   CO2 23 07/09/2022   GLUCOSE 121 (H) 07/09/2022   BUN 24 07/09/2022   CREATININE 1.09 07/09/2022   CALCIUM 9.6 07/09/2022   EGFR 73 07/09/2022   GFRNONAA >60 04/02/2021   Lab Results  Component Value Date   CHOL 186 07/09/2022   HDL 62 07/09/2022   LDLCALC 101 (H) 07/09/2022   TRIG 133 07/09/2022   Lab Results  Component Value Date   TSH 0.69 07/08/2018   Lab Results  Component Value Date   HGBA1C 5.4 04/06/2019   Lab Results  Component Value Date   WBC 5.6 04/02/2021   HGB 14.4 04/02/2021   HCT 40.8 04/02/2021   MCV 93.6 04/02/2021   PLT 238 04/02/2021   Lab Results  Component Value Date   ALT 21 07/09/2022   AST 15 07/09/2022   ALKPHOS 98 07/09/2022   BILITOT 0.3 07/09/2022   No results found for: "25OHVITD2", "25OHVITD3", "VD25OH"   Review of Systems  Constitutional:  Negative for chills, fever and malaise/fatigue.  HENT:  Negative for drooling, ear discharge, ear pain and sore throat.   Respiratory:   Negative for cough, shortness of breath and wheezing.   Cardiovascular:  Negative for chest pain, palpitations and leg swelling.  Gastrointestinal:  Negative for abdominal pain, blood in stool, constipation, diarrhea and nausea.  Endocrine: Negative for polydipsia.  Genitourinary:  Negative for dysuria, frequency, hematuria and urgency.  Musculoskeletal:  Negative for back pain, myalgias and neck pain.  Skin:  Negative for rash.  Allergic/Immunologic: Negative for environmental allergies.  Neurological:  Negative for dizziness and headaches.  Hematological:  Does not bruise/bleed easily.  Psychiatric/Behavioral:  Negative for sleep disturbance and suicidal ideas. The patient has insomnia. The patient is not nervous/anxious.     Patient Active Problem List   Diagnosis Date Noted   COVID 04/24/2022   Acute esophagitis    Columnar-lined esophagus    Hiatal hernia    Hyperlipidemia, mixed 07/01/2021   Insomnia disorder 07/01/2021   Environmental and seasonal allergies 07/01/2021   Esophageal dysphagia    Duodenal erythema    Esophagitis    Esophageal stenosis    Severe persistent asthma without complication 02/22/2021   Closed dislocation of tarsal joint of right foot 02/09/2021   Migraines 12/21/2017    No Known Allergies  Past Surgical History:  Procedure Laterality Date   BIOPSY N/A 04/29/2021   Procedure: BIOPSY;  Surgeon: Pasty Spillers, MD;  Location[facility];  Service: Endoscopy;  Laterality: N/A;   ELBOW SURGERY Right 2007   ESOPHAGOGASTRODUODENOSCOPY (EGD) WITH PROPOFOL N/A  04/29/2021   Procedure: ESOPHAGOGASTRODUODENOSCOPY (EGD) WITH PROPOFOL;  Surgeon: Pasty Spillers, MD;  Location: [facility];  Service: Endoscopy;  Laterality: N/A;   ESOPHAGOGASTRODUODENOSCOPY (EGD) WITH PROPOFOL N/A 07/22/2021   Procedure: ESOPHAGOGASTRODUODENOSCOPY (EGD) WITH PROPOFOL;  Surgeon: Pasty Spillers, MD;  Location: [facility];  Service:  Endoscopy;  Laterality: N/A;   ETHMOIDECTOMY Bilateral 10/27/2019   Procedure: TOTAL ETHMOIDECTOMY WITH FRONTAL SINUOTOMY;  Surgeon: Vernie Murders, MD;  Location: [facility];  Service: ENT;  Laterality: Bilateral;   EYE SURGERY  2019   Replaced   HAND SURGERY  1983   HERNIA REPAIR  1995   IMAGE GUIDED SINUS SURGERY Bilateral 10/27/2019   Procedure: IMAGE GUIDED SINUS SURGERY;  Surgeon: Vernie Murders, MD;  Location: [facility];  Service: ENT;  Laterality: Bilateral;  need stryker disk placed disk on or charge nurse desk 2-17   kp   IMAGE GUIDED SINUS SURGERY Bilateral 04/26/2020   Procedure: IMAGE GUIDED SINUS SURGERY;  Surgeon: Vernie Murders, MD;  Location: [facility];  Service: ENT;  Laterality: Bilateral;  need stryker disk gave Steward Drone disk on 8-23 kp   MENISCUS REPAIR Left 2018   SEPTOPLASTY Bilateral 10/27/2019   Procedure: SEPTOPLASTY REVISION;  Surgeon: Vernie Murders, MD;  Location: [facility];  Service: ENT;  Laterality: Bilateral;   SPHENOIDECTOMY Right 04/26/2020   Procedure: Selina Cooley;  Surgeon: Vernie Murders, MD;  Location: [facility];  Service: ENT;  Laterality: Right;   TONSILLECTOMY  1964    Social History   Tobacco Use   Smoking status: Former    Packs/day: 1.00    Years: 26.00    Additional pack years: 0.00    Total pack years: 26.00    Types: Cigarettes    Quit date: 09/01/1988    Years since quitting: 34.3   Smokeless tobacco: Never  Vaping Use   Vaping Use: Never used  Substance Use Topics   Alcohol use: Yes    Alcohol/week: 7.0 standard drinks of alcohol    Types: 7 Cans of beer per week    Comment: Social drinking only   Drug use: Never     Medication list has been reviewed and updated.  Current Meds  Medication Sig   albuterol (PROVENTIL) (2.5 MG/3ML) 0.083% nebulizer solution Take 2.5 mg by nebulization every 2 (two) hours as needed.   APPLE CIDER VINEGAR PO Take 500 mg by mouth.   budesonide  (PULMICORT) 0.5 MG/2ML nebulizer solution Inhale into the lungs.   Calcium Carbonate-Vitamin D (RA CALCIUM PLUS VITAMIN D PO) Take 525 mg by mouth 2 (two) times daily.   Cholecalciferol (VITAMIN D) 50 MCG (2000 UT) CAPS Take 1 capsule by mouth daily.   Coenzyme Q10 (COQ-10) 100 MG CAPS Take 1 each by mouth 2 (two) times daily.   DULoxetine (CYMBALTA) 20 MG capsule Take 60 mg by mouth 2 (two) times daily. Dr Fransisca Kaufmann   EPINEPHrine 0.3 mg/0.3 mL IJ SOAJ injection    fexofenadine (ALLEGRA) 180 MG tablet Take 180 mg by mouth daily.   fluticasone (FLONASE) 50 MCG/ACT nasal spray Place 2 sprays into both nostrils daily.   Glucosamine-Chondroitin (GLUCOSAMINE CHONDR COMPLEX PO) Take 3,700 mg by mouth daily.   ibuprofen (ADVIL) 600 MG tablet Take 600 mg by mouth every 6 (six) hours as needed.   Lasmiditan Succinate (REYVOW) 100 MG TABS Take by mouth.   montelukast (SINGULAIR) 10 MG tablet Take 1 tablet by mouth daily. Dr A.   naratriptan (AMERGE) 2.5 MG  tablet as needed.   omeprazole (PRILOSEC) 20 MG capsule Take 1 capsule (20 mg total) by mouth daily.   polycarbophil (FIBERCON) 625 MG tablet Take 1,250 mg by mouth 2 (two) times daily.   Riboflavin 400 MG TABS Take 1 tablet by mouth daily.   rosuvastatin (CRESTOR) 10 MG tablet Take 1 tablet daily   traZODone (DESYREL) 50 MG tablet TAKE 1 TO 2 TABLETS AT BEDTIME AS NEEDED FOR SLEEP   vitamin C (ASCORBIC ACID) 500 MG tablet Take 500 mg by mouth daily.   Vitamin E 180 MG CAPS Take 1 capsule by mouth daily.   Vitamins-Lipotropics (LIPO-FLAVONOID PLUS PO) Take 1,000 mg by mouth 2 (two) times daily.   Zinc 50 MG CAPS Take 1 capsule by mouth daily.       01/07/2023    8:03 AM 01/07/2023    7:59 AM 10/09/2022    3:22 PM 07/09/2022    8:41 AM  GAD 7 : Generalized Anxiety Score  Nervous, Anxious, on Edge 0 0 0 0  Control/stop worrying 0 0 0 0  Worry too much - different things 0 0 0 0  Trouble relaxing 0 0 0 0  Restless 0 0 0 0  Easily annoyed or  irritable 0 0 0 0  Afraid - awful might happen 0 0 0 0  Total GAD 7 Score 0 0 0 0  Anxiety Difficulty Not difficult at all Not difficult at all Not difficult at all Not difficult at all       01/07/2023    8:03 AM 01/07/2023    7:59 AM 10/09/2022    3:21 PM  Depression screen PHQ 2/9  Decreased Interest 0 0 0  Down, Depressed, Hopeless 0 0 0  PHQ - 2 Score 0 0 0  Altered sleeping 0 0 0  Tired, decreased energy 0 0 0  Change in appetite 0 0 0  Feeling bad or failure about yourself  0 0 0  Trouble concentrating 0 0 0  Moving slowly or fidgety/restless 0 0 0  Suicidal thoughts 0 0 0  PHQ-9 Score 0 0 0  Difficult doing work/chores Not difficult at all Not difficult at all Not difficult at all    BP Readings from Last 3 Encounters:  01/07/23 124/78  10/09/22 118/78  10/04/22 (!) 128/91    Physical Exam Vitals and nursing note reviewed.  HENT:     Head: Normocephalic.     Right Ear: Tympanic membrane and external ear normal.     Left Ear: Tympanic membrane and external ear normal.     Nose: Nose normal. No congestion or rhinorrhea.     Mouth/Throat:     Mouth: Mucous membranes are moist.     Pharynx: No oropharyngeal exudate or posterior oropharyngeal erythema.  Eyes:     General: No scleral icterus.       Right eye: No discharge.        Left eye: No discharge.     Conjunctiva/sclera: Conjunctivae normal.     Pupils: Pupils are equal, round, and reactive to light.  Neck:     Thyroid: No thyromegaly.     Vascular: No JVD.     Trachea: No tracheal deviation.  Cardiovascular:     Rate and Rhythm: Normal rate and regular rhythm.     Heart sounds: Normal heart sounds. No murmur heard.    No friction rub. No gallop.  Pulmonary:     Effort: No respiratory distress.  Breath sounds: Normal breath sounds. No wheezing, rhonchi or rales.  Abdominal:     General: Bowel sounds are normal.     Palpations: Abdomen is soft. There is no mass.     Tenderness: There is no abdominal  tenderness. There is no guarding or rebound.  Musculoskeletal:        General: No tenderness. Normal range of motion.     Cervical back: Normal range of motion and neck supple.  Lymphadenopathy:     Cervical: No cervical adenopathy.  Skin:    General: Skin is warm.     Findings: No rash.  Neurological:     Mental Status: He is alert and oriented to person, place, and time.     Cranial Nerves: No cranial nerve deficit.     Deep Tendon Reflexes: Reflexes are normal and symmetric.     Wt Readings from Last 3 Encounters:  01/07/23 155 lb (70.3 kg)  10/09/22 155 lb (70.3 kg)  10/04/22 155 lb (70.3 kg)    BP 124/78   Pulse 95   Ht 5\' 4"  (1.626 m)   Wt 155 lb (70.3 kg)   SpO2 96%   BMI 26.61 kg/m   Assessment and Plan:  1. Hyperlipidemia, unspecified hyperlipidemia type Chronic.  Controlled.  Stable.  Review of previous lipid panel is in acceptable range and we will continue with dietary approach and over-the-counter preparations as well as Crestor 10 mg once a day.  Will recheck in 6 months. - rosuvastatin (CRESTOR) 10 MG tablet; Take 1 tablet daily  Dispense: 90 tablet; Refill: 1  2. Primary insomnia .  Controlled.  Stable.  Once every 2 weeks may have a little difficulty initiating sleep otherwise trazodone is doing an excellent therapeutic regimen for initiating sleep and maintaining.  Will continue and recheck in 6 months - traZODone (DESYREL) 50 MG tablet; TAKE 1 TO 2 TABLETS AT BEDTIME AS NEEDED FOR SLEEP  Dispense: 180 tablet; Refill: 1  3. Hyperglycemia Review of previous labs notes elevated glucoses on occasions with concerns that patient may be prediabetic and we will check A1c at this time - HgB A1c    Elizabeth Sauer, MD

## 2023-01-08 DIAGNOSIS — R262 Difficulty in walking, not elsewhere classified: Secondary | ICD-10-CM | POA: Diagnosis not present

## 2023-01-08 DIAGNOSIS — M6281 Muscle weakness (generalized): Secondary | ICD-10-CM | POA: Diagnosis not present

## 2023-01-08 DIAGNOSIS — M79671 Pain in right foot: Secondary | ICD-10-CM | POA: Diagnosis not present

## 2023-01-08 DIAGNOSIS — M19071 Primary osteoarthritis, right ankle and foot: Secondary | ICD-10-CM | POA: Diagnosis not present

## 2023-01-08 LAB — HEMOGLOBIN A1C
Est. average glucose Bld gHb Est-mCnc: 120 mg/dL
Hgb A1c MFr Bld: 5.8 % — ABNORMAL HIGH (ref 4.8–5.6)

## 2023-01-13 DIAGNOSIS — M6281 Muscle weakness (generalized): Secondary | ICD-10-CM | POA: Diagnosis not present

## 2023-01-13 DIAGNOSIS — R262 Difficulty in walking, not elsewhere classified: Secondary | ICD-10-CM | POA: Diagnosis not present

## 2023-01-13 DIAGNOSIS — M19071 Primary osteoarthritis, right ankle and foot: Secondary | ICD-10-CM | POA: Diagnosis not present

## 2023-01-13 DIAGNOSIS — M79671 Pain in right foot: Secondary | ICD-10-CM | POA: Diagnosis not present

## 2023-01-13 DIAGNOSIS — J301 Allergic rhinitis due to pollen: Secondary | ICD-10-CM | POA: Diagnosis not present

## 2023-01-15 DIAGNOSIS — M6281 Muscle weakness (generalized): Secondary | ICD-10-CM | POA: Diagnosis not present

## 2023-01-15 DIAGNOSIS — M79671 Pain in right foot: Secondary | ICD-10-CM | POA: Diagnosis not present

## 2023-01-15 DIAGNOSIS — M19071 Primary osteoarthritis, right ankle and foot: Secondary | ICD-10-CM | POA: Diagnosis not present

## 2023-01-15 DIAGNOSIS — R262 Difficulty in walking, not elsewhere classified: Secondary | ICD-10-CM | POA: Diagnosis not present

## 2023-01-20 DIAGNOSIS — J301 Allergic rhinitis due to pollen: Secondary | ICD-10-CM | POA: Diagnosis not present

## 2023-01-20 DIAGNOSIS — R262 Difficulty in walking, not elsewhere classified: Secondary | ICD-10-CM | POA: Diagnosis not present

## 2023-01-20 DIAGNOSIS — M79671 Pain in right foot: Secondary | ICD-10-CM | POA: Diagnosis not present

## 2023-01-20 DIAGNOSIS — M6281 Muscle weakness (generalized): Secondary | ICD-10-CM | POA: Diagnosis not present

## 2023-01-20 DIAGNOSIS — M19071 Primary osteoarthritis, right ankle and foot: Secondary | ICD-10-CM | POA: Diagnosis not present

## 2023-01-27 DIAGNOSIS — J301 Allergic rhinitis due to pollen: Secondary | ICD-10-CM | POA: Diagnosis not present

## 2023-02-03 DIAGNOSIS — R519 Headache, unspecified: Secondary | ICD-10-CM | POA: Diagnosis not present

## 2023-02-03 DIAGNOSIS — M791 Myalgia, unspecified site: Secondary | ICD-10-CM | POA: Diagnosis not present

## 2023-02-03 DIAGNOSIS — J301 Allergic rhinitis due to pollen: Secondary | ICD-10-CM | POA: Diagnosis not present

## 2023-02-10 DIAGNOSIS — J301 Allergic rhinitis due to pollen: Secondary | ICD-10-CM | POA: Diagnosis not present

## 2023-02-17 DIAGNOSIS — S93314D Dislocation of tarsal joint of right foot, subsequent encounter: Secondary | ICD-10-CM | POA: Diagnosis not present

## 2023-02-17 DIAGNOSIS — T8484XA Pain due to internal orthopedic prosthetic devices, implants and grafts, initial encounter: Secondary | ICD-10-CM | POA: Diagnosis not present

## 2023-02-17 DIAGNOSIS — J301 Allergic rhinitis due to pollen: Secondary | ICD-10-CM | POA: Diagnosis not present

## 2023-02-17 DIAGNOSIS — W11XXXD Fall on and from ladder, subsequent encounter: Secondary | ICD-10-CM | POA: Diagnosis not present

## 2023-02-17 DIAGNOSIS — M19071 Primary osteoarthritis, right ankle and foot: Secondary | ICD-10-CM | POA: Diagnosis not present

## 2023-02-17 DIAGNOSIS — M7731 Calcaneal spur, right foot: Secondary | ICD-10-CM | POA: Diagnosis not present

## 2023-02-17 DIAGNOSIS — M24571 Contracture, right ankle: Secondary | ICD-10-CM | POA: Diagnosis not present

## 2023-02-24 DIAGNOSIS — M542 Cervicalgia: Secondary | ICD-10-CM | POA: Diagnosis not present

## 2023-02-24 DIAGNOSIS — J301 Allergic rhinitis due to pollen: Secondary | ICD-10-CM | POA: Diagnosis not present

## 2023-02-24 DIAGNOSIS — M791 Myalgia, unspecified site: Secondary | ICD-10-CM | POA: Diagnosis not present

## 2023-02-25 DIAGNOSIS — C672 Malignant neoplasm of lateral wall of bladder: Secondary | ICD-10-CM | POA: Diagnosis not present

## 2023-03-03 DIAGNOSIS — J301 Allergic rhinitis due to pollen: Secondary | ICD-10-CM | POA: Diagnosis not present

## 2023-03-10 DIAGNOSIS — J301 Allergic rhinitis due to pollen: Secondary | ICD-10-CM | POA: Diagnosis not present

## 2023-03-16 DIAGNOSIS — J301 Allergic rhinitis due to pollen: Secondary | ICD-10-CM | POA: Diagnosis not present

## 2023-03-17 DIAGNOSIS — J301 Allergic rhinitis due to pollen: Secondary | ICD-10-CM | POA: Diagnosis not present

## 2023-04-06 DIAGNOSIS — J3489 Other specified disorders of nose and nasal sinuses: Secondary | ICD-10-CM | POA: Diagnosis not present

## 2023-04-06 DIAGNOSIS — J323 Chronic sphenoidal sinusitis: Secondary | ICD-10-CM | POA: Diagnosis not present

## 2023-04-06 DIAGNOSIS — J301 Allergic rhinitis due to pollen: Secondary | ICD-10-CM | POA: Diagnosis not present

## 2023-04-07 DIAGNOSIS — J301 Allergic rhinitis due to pollen: Secondary | ICD-10-CM | POA: Diagnosis not present

## 2023-04-10 DIAGNOSIS — J4540 Moderate persistent asthma, uncomplicated: Secondary | ICD-10-CM | POA: Diagnosis not present

## 2023-04-14 DIAGNOSIS — J301 Allergic rhinitis due to pollen: Secondary | ICD-10-CM | POA: Diagnosis not present

## 2023-04-21 ENCOUNTER — Ambulatory Visit: Payer: Medicare PPO | Admitting: Family Medicine

## 2023-04-21 ENCOUNTER — Encounter: Payer: Self-pay | Admitting: Family Medicine

## 2023-04-21 VITALS — BP 110/60 | HR 90 | Ht 64.0 in | Wt 165.0 lb

## 2023-04-21 DIAGNOSIS — J301 Allergic rhinitis due to pollen: Secondary | ICD-10-CM | POA: Diagnosis not present

## 2023-04-21 DIAGNOSIS — M21611 Bunion of right foot: Secondary | ICD-10-CM | POA: Diagnosis not present

## 2023-04-21 NOTE — Progress Notes (Signed)
Date:  04/21/2023   Name:  Blake Patrick   DOB:  06/21/1951   MRN:  [id number]   Chief Complaint: Foot Pain  Foot Pain This is a chronic problem. The current episode started more than 1 year ago. The problem occurs constantly. The problem has been waxing and waning. Pertinent negatives include no abdominal pain, chest pain, joint swelling or sore throat. Nothing aggravates the symptoms. Treatments tried: surgery.    Lab Results  Component Value Date   NA 139 07/09/2022   K 4.9 07/09/2022   CO2 23 07/09/2022   GLUCOSE 121 (H) 07/09/2022   BUN 24 07/09/2022   CREATININE 1.09 07/09/2022   CALCIUM 9.6 07/09/2022   EGFR 73 07/09/2022   GFRNONAA >60 04/02/2021   Lab Results  Component Value Date   CHOL 186 07/09/2022   HDL 62 07/09/2022   LDLCALC 101 (H) 07/09/2022   TRIG 133 07/09/2022   Lab Results  Component Value Date   TSH 0.69 07/08/2018   Lab Results  Component Value Date   HGBA1C 5.8 (H) 01/07/2023   Lab Results  Component Value Date   WBC 5.6 04/02/2021   HGB 14.4 04/02/2021   HCT 40.8 04/02/2021   MCV 93.6 04/02/2021   PLT 238 04/02/2021   Lab Results  Component Value Date   ALT 21 07/09/2022   AST 15 07/09/2022   ALKPHOS 98 07/09/2022   BILITOT 0.3 07/09/2022   No results found for: "25OHVITD2", "25OHVITD3", "VD25OH"   Review of Systems  Constitutional:  Negative for unexpected weight change.  HENT:  Negative for postnasal drip, sinus pain and sore throat.   Eyes:  Negative for photophobia.  Respiratory:  Negative for choking, shortness of breath and wheezing.   Cardiovascular:  Negative for chest pain, palpitations and leg swelling.  Gastrointestinal:  Negative for abdominal distention and abdominal pain.  Musculoskeletal:  Negative for joint swelling.    Patient Active Problem List   Diagnosis Date Noted   COVID 04/24/2022   Acute esophagitis    Columnar-lined esophagus    Hiatal hernia    Hyperlipidemia, mixed 07/01/2021   Insomnia  disorder 07/01/2021   Environmental and seasonal allergies 07/01/2021   Esophageal dysphagia    Duodenal erythema    Esophagitis    Esophageal stenosis    Severe persistent asthma without complication 02/22/2021   Closed dislocation of tarsal joint of right foot 02/09/2021   Migraines 12/21/2017    No Known Allergies  Past Surgical History:  Procedure Laterality Date   BIOPSY N/A 04/29/2021   Procedure: BIOPSY;  Surgeon: Pasty Spillers, MD;  Location: [facility];  Service: Endoscopy;  Laterality: N/A;   ELBOW SURGERY Right 2007   ESOPHAGOGASTRODUODENOSCOPY (EGD) WITH PROPOFOL N/A 04/29/2021   Procedure: ESOPHAGOGASTRODUODENOSCOPY (EGD) WITH PROPOFOL;  Surgeon: Pasty Spillers, MD;  Location: [facility];  Service: Endoscopy;  Laterality: N/A;   ESOPHAGOGASTRODUODENOSCOPY (EGD) WITH PROPOFOL N/A 07/22/2021   Procedure: ESOPHAGOGASTRODUODENOSCOPY (EGD) WITH PROPOFOL;  Surgeon: Pasty Spillers, MD;  Location: [place] [facility];  Service: Endoscopy;  Laterality: N/A;   ETHMOIDECTOMY Bilateral 10/27/2019   Procedure: TOTAL ETHMOIDECTOMY WITH FRONTAL SINUOTOMY;  Surgeon: Vernie Murders, MD;  Location: [facility];  Service: ENT;  Laterality: Bilateral;   EYE SURGERY  2019   Replaced   HAND SURGERY  1983   HERNIA REPAIR  1995   IMAGE GUIDED SINUS SURGERY Bilateral 10/27/2019   Procedure: IMAGE GUIDED SINUS SURGERY;  Surgeon: Vernie Murders, MD;  Location: [facility];  Service: ENT;  Laterality: Bilateral;  need stryker disk placed disk on or charge nurse desk 2-17   kp   IMAGE GUIDED SINUS SURGERY Bilateral 04/26/2020   Procedure: IMAGE GUIDED SINUS SURGERY;  Surgeon: Vernie Murders, MD;  Location: [facility];  Service: ENT;  Laterality: Bilateral;  need stryker disk gave Brenda disk on 8-23 kp   MENISCUS REPAIR Left 2018   SEPTOPLASTY Bilateral 10/27/2019   Procedure: SEPTOPLASTY REVISION;  Surgeon: Vernie Murders, MD;  Location:  [facility];  Service: ENT;  Laterality: Bilateral;   SPHENOIDECTOMY Right 04/26/2020   Procedure: Selina Cooley;  Surgeon: Vernie Murders, MD;  Location: [facility];  Service: ENT;  Laterality: Right;   TONSILLECTOMY  1964    Social History   Tobacco Use   Smoking status: Former    Current packs/day: 0.00    Average packs/day: 1 pack/day for 26.0 years (26.0 ttl pk-yrs)    Types: Cigarettes    Start date: 09/01/1962    Quit date: 09/01/1988    Years since quitting: 34.6   Smokeless tobacco: Never  Vaping Use   Vaping status: Never Used  Substance Use Topics   Alcohol use: Yes    Alcohol/week: 7.0 standard drinks of alcohol    Types: 7 Cans of beer per week    Comment: Social drinking only   Drug use: Never     Medication list has been reviewed and updated.  Current Meds  Medication Sig   albuterol (PROVENTIL) (2.5 MG/3ML) 0.083% nebulizer solution Take 2.5 mg by nebulization every 2 (two) hours as needed.   APPLE CIDER VINEGAR PO Take 500 mg by mouth.   Calcium Carbonate-Vitamin D (RA CALCIUM PLUS VITAMIN D PO) Take 525 mg by mouth 2 (two) times daily.   Cholecalciferol (VITAMIN D) 50 MCG (2000 UT) CAPS Take 1 capsule by mouth daily.   Coenzyme Q10 (COQ-10) 100 MG CAPS Take 1 each by mouth 2 (two) times daily.   DULoxetine (CYMBALTA) 20 MG capsule Take 60 mg by mouth 2 (two) times daily. Dr Fransisca Kaufmann   EPINEPHrine 0.3 mg/0.3 mL IJ SOAJ injection    fexofenadine (ALLEGRA) 180 MG tablet Take 180 mg by mouth daily.   fluticasone (FLONASE) 50 MCG/ACT nasal spray Place 2 sprays into both nostrils daily.   gabapentin (NEURONTIN) 300 MG capsule 600 mg 2 (two) times daily.   Glucosamine-Chondroitin (GLUCOSAMINE CHONDR COMPLEX PO) Take 3,700 mg by mouth daily.   ibuprofen (ADVIL) 600 MG tablet Take 600 mg by mouth every 6 (six) hours as needed.   Lasmiditan Succinate (REYVOW) 100 MG TABS Take by mouth.   montelukast (SINGULAIR) 10 MG tablet Take 1 tablet by mouth  daily. Dr A.   naratriptan (AMERGE) 2.5 MG tablet as needed.   omeprazole (PRILOSEC) 20 MG capsule Take 1 capsule (20 mg total) by mouth daily.   polycarbophil (FIBERCON) 625 MG tablet Take 1,250 mg by mouth 2 (two) times daily.   Riboflavin 400 MG TABS Take 1 tablet by mouth daily.   rosuvastatin (CRESTOR) 10 MG tablet Take 1 tablet daily   traZODone (DESYREL) 50 MG tablet TAKE 1 TO 2 TABLETS AT BEDTIME AS NEEDED FOR SLEEP   vitamin C (ASCORBIC ACID) 500 MG tablet Take 500 mg by mouth daily.   Vitamin E 180 MG CAPS Take 1 capsule by mouth daily.   Vitamins-Lipotropics (LIPO-FLAVONOID PLUS PO) Take 1,000 mg by mouth 2 (two) times daily.   Zinc 50 MG CAPS Take  1 capsule by mouth daily.       04/21/2023    9:25 AM 01/07/2023    8:03 AM 01/07/2023    7:59 AM 10/09/2022    3:22 PM  GAD 7 : Generalized Anxiety Score  Nervous, Anxious, on Edge 0 0 0 0  Control/stop worrying 0 0 0 0  Worry too much - different things 0 0 0 0  Trouble relaxing 0 0 0 0  Restless 0 0 0 0  Easily annoyed or irritable 0 0 0 0  Afraid - awful might happen 0 0 0 0  Total GAD 7 Score 0 0 0 0  Anxiety Difficulty Not difficult at all Not difficult at all Not difficult at all Not difficult at all       04/21/2023    9:25 AM 01/07/2023    8:03 AM 01/07/2023    7:59 AM  Depression screen PHQ 2/9  Decreased Interest 0 0 0  Down, Depressed, Hopeless 0 0 0  PHQ - 2 Score 0 0 0  Altered sleeping 0 0 0  Tired, decreased energy 0 0 0  Change in appetite 0 0 0  Feeling bad or failure about yourself  0 0 0  Trouble concentrating 0 0 0  Moving slowly or fidgety/restless 0 0 0  Suicidal thoughts 0 0 0  PHQ-9 Score 0 0 0  Difficult doing work/chores Not difficult at all Not difficult at all Not difficult at all    BP Readings from Last 3 Encounters:  04/21/23 110/60  01/07/23 124/78  10/09/22 118/78    Physical Exam Vitals and nursing note reviewed.  HENT:     Head: Normocephalic.     Right Ear: Tympanic membrane,  ear canal and external ear normal. There is no impacted cerumen.     Left Ear: Tympanic membrane, ear canal and external ear normal. There is no impacted cerumen.     Mouth/Throat:     Mouth: Mucous membranes are moist.  Cardiovascular:     Heart sounds: No murmur heard.    No friction rub. No gallop.  Pulmonary:     Breath sounds: No wheezing, rhonchi or rales.  Abdominal:     General: Abdomen is flat.     Palpations: Abdomen is soft.  Musculoskeletal:     Right foot: Normal range of motion. Deformity and bunion present. No Charcot foot, foot drop or prominent metatarsal heads.       Feet:  Neurological:     Mental Status: He is alert.     Wt Readings from Last 3 Encounters:  04/21/23 165 lb (74.8 kg)  01/07/23 155 lb (70.3 kg)  10/09/22 155 lb (70.3 kg)    BP 110/60   Pulse 90   Ht 5\' 4"  (1.626 m)   Wt 165 lb (74.8 kg)   SpO2 96%   BMI 28.32 kg/m   Assessment and Plan:  1. Bunion, right foot Chronic.  Persistent.  Relatively stable.  Depending on level of activity and ambulation time on right foot.  There is deformity noted fifth MP joint.  There is no tenderness but patient is over pronating and needs an podiatry consult for possibility of insert development. - Ambulatory referral to Podiatry    Elizabeth Sauer, MD

## 2023-04-24 DIAGNOSIS — M19171 Post-traumatic osteoarthritis, right ankle and foot: Secondary | ICD-10-CM | POA: Diagnosis not present

## 2023-05-06 ENCOUNTER — Other Ambulatory Visit: Payer: Self-pay | Admitting: Gastroenterology

## 2023-05-12 DIAGNOSIS — J301 Allergic rhinitis due to pollen: Secondary | ICD-10-CM | POA: Diagnosis not present

## 2023-05-19 DIAGNOSIS — M791 Myalgia, unspecified site: Secondary | ICD-10-CM | POA: Diagnosis not present

## 2023-05-19 DIAGNOSIS — J301 Allergic rhinitis due to pollen: Secondary | ICD-10-CM | POA: Diagnosis not present

## 2023-05-26 DIAGNOSIS — J301 Allergic rhinitis due to pollen: Secondary | ICD-10-CM | POA: Diagnosis not present

## 2023-06-02 ENCOUNTER — Ambulatory Visit (INDEPENDENT_AMBULATORY_CARE_PROVIDER_SITE_OTHER): Payer: Medicare PPO

## 2023-06-02 DIAGNOSIS — Z23 Encounter for immunization: Secondary | ICD-10-CM | POA: Diagnosis not present

## 2023-06-02 DIAGNOSIS — J301 Allergic rhinitis due to pollen: Secondary | ICD-10-CM | POA: Diagnosis not present

## 2023-06-09 DIAGNOSIS — J301 Allergic rhinitis due to pollen: Secondary | ICD-10-CM | POA: Diagnosis not present

## 2023-06-10 DIAGNOSIS — J301 Allergic rhinitis due to pollen: Secondary | ICD-10-CM | POA: Diagnosis not present

## 2023-06-16 DIAGNOSIS — J301 Allergic rhinitis due to pollen: Secondary | ICD-10-CM | POA: Diagnosis not present

## 2023-06-23 DIAGNOSIS — J301 Allergic rhinitis due to pollen: Secondary | ICD-10-CM | POA: Diagnosis not present

## 2023-06-24 DIAGNOSIS — Z872 Personal history of diseases of the skin and subcutaneous tissue: Secondary | ICD-10-CM | POA: Diagnosis not present

## 2023-06-24 DIAGNOSIS — Z859 Personal history of malignant neoplasm, unspecified: Secondary | ICD-10-CM | POA: Diagnosis not present

## 2023-06-24 DIAGNOSIS — L570 Actinic keratosis: Secondary | ICD-10-CM | POA: Diagnosis not present

## 2023-06-24 DIAGNOSIS — L578 Other skin changes due to chronic exposure to nonionizing radiation: Secondary | ICD-10-CM | POA: Diagnosis not present

## 2023-06-24 DIAGNOSIS — L720 Epidermal cyst: Secondary | ICD-10-CM | POA: Diagnosis not present

## 2023-06-24 DIAGNOSIS — L821 Other seborrheic keratosis: Secondary | ICD-10-CM | POA: Diagnosis not present

## 2023-06-30 DIAGNOSIS — J301 Allergic rhinitis due to pollen: Secondary | ICD-10-CM | POA: Diagnosis not present

## 2023-07-07 DIAGNOSIS — J301 Allergic rhinitis due to pollen: Secondary | ICD-10-CM | POA: Diagnosis not present

## 2023-07-10 ENCOUNTER — Ambulatory Visit: Payer: Medicare PPO | Admitting: Family Medicine

## 2023-07-10 ENCOUNTER — Encounter: Payer: Self-pay | Admitting: Family Medicine

## 2023-07-10 VITALS — BP 116/68 | HR 64 | Ht 64.0 in | Wt 163.0 lb

## 2023-07-10 DIAGNOSIS — R7303 Prediabetes: Secondary | ICD-10-CM

## 2023-07-10 DIAGNOSIS — F5101 Primary insomnia: Secondary | ICD-10-CM

## 2023-07-10 DIAGNOSIS — E785 Hyperlipidemia, unspecified: Secondary | ICD-10-CM

## 2023-07-10 MED ORDER — ROSUVASTATIN CALCIUM 10 MG PO TABS: ORAL_TABLET | ORAL | 1 refills | Status: DC

## 2023-07-10 MED ORDER — TRAZODONE HCL 50 MG PO TABS: ORAL_TABLET | ORAL | 1 refills | Status: DC

## 2023-07-10 NOTE — Progress Notes (Signed)
Date:  07/10/2023   Name:  Blake Patrick   DOB:  28-Mar-1951   MRN:  [id number]   Chief Complaint: Hyperlipidemia and Insomnia  Hyperlipidemia This is a chronic problem. The current episode started more than 1 year ago. Recent lipid tests were reviewed and are normal. He has no history of chronic renal disease, diabetes, hypothyroidism, liver disease, obesity or nephrotic syndrome. There are no known factors aggravating his hyperlipidemia. Pertinent negatives include no chest pain, focal sensory loss, focal weakness, leg pain, myalgias or shortness of breath. Current antihyperlipidemic treatment includes statins. The current treatment provides moderate improvement of lipids. There are no compliance problems.  There are no known risk factors for coronary artery disease.  Insomnia Primary symptoms: no fragmented sleep, no difficulty falling asleep, no frequent awakening.   The current episode started more than one year. The problem has been gradually improving since onset. The symptoms are relieved by medication. Past treatments include medication. The treatment provided moderate relief.  Diabetes He presents for his follow-up diabetic visit. He has type 2 diabetes mellitus. His disease course has been stable. Pertinent negatives for diabetes include no blurred vision, no chest pain, no fatigue, no polydipsia, no polyuria and no weight loss. There are no hypoglycemic complications. Symptoms are stable. There are no diabetic complications. Risk factors for coronary artery disease include dyslipidemia, diabetes mellitus and hypertension.    Lab Results  Component Value Date   NA 139 07/09/2022   K 4.9 07/09/2022   CO2 23 07/09/2022   GLUCOSE 121 (H) 07/09/2022   BUN 24 07/09/2022   CREATININE 1.09 07/09/2022   CALCIUM 9.6 07/09/2022   EGFR 73 07/09/2022   GFRNONAA >60 04/02/2021   Lab Results  Component Value Date   CHOL 186 07/09/2022   HDL 62 07/09/2022   LDLCALC 101 (H) 07/09/2022    TRIG 133 07/09/2022   Lab Results  Component Value Date   TSH 0.69 07/08/2018   Lab Results  Component Value Date   HGBA1C 5.8 (H) 01/07/2023   Lab Results  Component Value Date   WBC 5.6 04/02/2021   HGB 14.4 04/02/2021   HCT 40.8 04/02/2021   MCV 93.6 04/02/2021   PLT 238 04/02/2021   Lab Results  Component Value Date   ALT 21 07/09/2022   AST 15 07/09/2022   ALKPHOS 98 07/09/2022   BILITOT 0.3 07/09/2022   No results found for: "25OHVITD2", "25OHVITD3", "VD25OH"   Review of Systems  Constitutional:  Negative for fatigue and weight loss.  Eyes:  Negative for blurred vision and visual disturbance.  Respiratory:  Negative for chest tightness, shortness of breath and wheezing.   Cardiovascular:  Negative for chest pain, palpitations and leg swelling.  Gastrointestinal:  Negative for abdominal pain and blood in stool.  Endocrine: Negative for polydipsia and polyuria.  Genitourinary:  Negative for difficulty urinating and hematuria.  Musculoskeletal:  Negative for myalgias.  Neurological:  Negative for focal weakness.  Psychiatric/Behavioral:  The patient has insomnia.     Patient Active Problem List   Diagnosis Date Noted   COVID 04/24/2022   Acute esophagitis    Columnar-lined esophagus    Hiatal hernia    Hyperlipidemia, mixed 07/01/2021   Insomnia disorder 07/01/2021   Environmental and seasonal allergies 07/01/2021   Esophageal dysphagia    Duodenal erythema    Esophagitis    Esophageal stenosis    Severe persistent asthma without complication 02/22/2021   Closed dislocation of tarsal joint of right foot  02/09/2021   Migraines 12/21/2017    No Known Allergies  Past Surgical History:  Procedure Laterality Date   BIOPSY N/A 04/29/2021   Procedure: BIOPSY;  Surgeon: Pasty Spillers, MD;  Location: [facility];  Service: Endoscopy;  Laterality: N/A;   ELBOW SURGERY Right 2007   ESOPHAGOGASTRODUODENOSCOPY (EGD) WITH PROPOFOL N/A 04/29/2021    Procedure: ESOPHAGOGASTRODUODENOSCOPY (EGD) WITH PROPOFOL;  Surgeon: Pasty Spillers, MD;  Location: [facility];  Service: Endoscopy;  Laterality: N/A;   ESOPHAGOGASTRODUODENOSCOPY (EGD) WITH PROPOFOL N/A 07/22/2021   Procedure: ESOPHAGOGASTRODUODENOSCOPY (EGD) WITH PROPOFOL;  Surgeon: Pasty Spillers, MD;  Location: [facility];  Service: Endoscopy;  Laterality: N/A;   ETHMOIDECTOMY Bilateral 10/27/2019   Procedure: TOTAL ETHMOIDECTOMY WITH FRONTAL SINUOTOMY;  Surgeon: Vernie Murders, MD;  Location: [facility];  Service: ENT;  Laterality: Bilateral;   EYE SURGERY  2019   Replaced   HAND SURGERY  1983   HERNIA REPAIR  1995   IMAGE GUIDED SINUS SURGERY Bilateral 10/27/2019   Procedure: IMAGE GUIDED SINUS SURGERY;  Surgeon: Vernie Murders, MD;  Location: [facility];  Service: ENT;  Laterality: Bilateral;  need stryker disk placed disk on or charge nurse desk 2-17   kp   IMAGE GUIDED SINUS SURGERY Bilateral 04/26/2020   Procedure: IMAGE GUIDED SINUS SURGERY;  Surgeon: Vernie Murders, MD;  Location: [facility];  Service: ENT;  Laterality: Bilateral;  need stryker disk gave Steward Drone disk on 8-23 kp   MENISCUS REPAIR Left 2018   SEPTOPLASTY Bilateral 10/27/2019   Procedure: SEPTOPLASTY REVISION;  Surgeon: Vernie Murders, MD;  Location: [facility];  Service: ENT;  Laterality: Bilateral;   SPHENOIDECTOMY Right 04/26/2020   Procedure: Selina Cooley;  Surgeon: Vernie Murders, MD;  Location: [facility];  Service: ENT;  Laterality: Right;   TONSILLECTOMY  1964    Social History   Tobacco Use   Smoking status: Former    Current packs/day: 0.00    Average packs/day: 1 pack/day for 26.0 years (26.0 ttl pk-yrs)    Types: Cigarettes    Start date: 09/01/1962    Quit date: 09/01/1988    Years since quitting: 34.8   Smokeless tobacco: Never  Vaping Use   Vaping status: Never Used  Substance Use Topics   Alcohol use: Yes    Alcohol/week: 7.0  standard drinks of alcohol    Types: 7 Cans of beer per week    Comment: Social drinking only   Drug use: Never     Medication list has been reviewed and updated.  Current Meds  Medication Sig   albuterol (PROVENTIL) (2.5 MG/3ML) 0.083% nebulizer solution Take 2.5 mg by nebulization every 2 (two) hours as needed.   APPLE CIDER VINEGAR PO Take 500 mg by mouth.   budesonide (PULMICORT) 0.5 MG/2ML nebulizer solution Inhale into the lungs.   Calcium Carbonate-Vitamin D (RA CALCIUM PLUS VITAMIN D PO) Take 525 mg by mouth 2 (two) times daily.   Cholecalciferol (VITAMIN D) 50 MCG (2000 UT) CAPS Take 1 capsule by mouth daily.   Coenzyme Q10 (COQ-10) 100 MG CAPS Take 1 each by mouth 2 (two) times daily.   DULoxetine (CYMBALTA) 20 MG capsule Take 60 mg by mouth 2 (two) times daily. Dr Fransisca Kaufmann   EPINEPHrine 0.3 mg/0.3 mL IJ SOAJ injection    fexofenadine (ALLEGRA) 180 MG tablet Take 180 mg by mouth daily.   fluticasone (FLONASE) 50 MCG/ACT nasal spray Place 2 sprays into both nostrils daily.   gabapentin (NEURONTIN)  300 MG capsule 600 mg 2 (two) times daily.   Glucosamine-Chondroitin (GLUCOSAMINE CHONDR COMPLEX PO) Take 3,700 mg by mouth daily.   ibuprofen (ADVIL) 600 MG tablet Take 600 mg by mouth every 6 (six) hours as needed.   Lasmiditan Succinate (REYVOW) 100 MG TABS Take by mouth.   naratriptan (AMERGE) 2.5 MG tablet as needed.   omeprazole (PRILOSEC) 20 MG capsule TAKE 1 CAPSULE EVERY DAY   polycarbophil (FIBERCON) 625 MG tablet Take 1,250 mg by mouth 2 (two) times daily.   Riboflavin 400 MG TABS Take 1 tablet by mouth daily.   rosuvastatin (CRESTOR) 10 MG tablet Take 1 tablet daily   traZODone (DESYREL) 50 MG tablet TAKE 1 TO 2 TABLETS AT BEDTIME AS NEEDED FOR SLEEP   vitamin C (ASCORBIC ACID) 500 MG tablet Take 500 mg by mouth daily.   Vitamin E 180 MG CAPS Take 1 capsule by mouth daily.   Vitamins-Lipotropics (LIPO-FLAVONOID PLUS PO) Take 1,000 mg by mouth 2 (two) times daily.    Zinc 50 MG CAPS Take 1 capsule by mouth daily.       04/21/2023    9:25 AM 01/07/2023    8:03 AM 01/07/2023    7:59 AM 10/09/2022    3:22 PM  GAD 7 : Generalized Anxiety Score  Nervous, Anxious, on Edge 0 0 0 0  Control/stop worrying 0 0 0 0  Worry too much - different things 0 0 0 0  Trouble relaxing 0 0 0 0  Restless 0 0 0 0  Easily annoyed or irritable 0 0 0 0  Afraid - awful might happen 0 0 0 0  Total GAD 7 Score 0 0 0 0  Anxiety Difficulty Not difficult at all Not difficult at all Not difficult at all Not difficult at all       04/21/2023    9:25 AM 01/07/2023    8:03 AM 01/07/2023    7:59 AM  Depression screen PHQ 2/9  Decreased Interest 0 0 0  Down, Depressed, Hopeless 0 0 0  PHQ - 2 Score 0 0 0  Altered sleeping 0 0 0  Tired, decreased energy 0 0 0  Change in appetite 0 0 0  Feeling bad or failure about yourself  0 0 0  Trouble concentrating 0 0 0  Moving slowly or fidgety/restless 0 0 0  Suicidal thoughts 0 0 0  PHQ-9 Score 0 0 0  Difficult doing work/chores Not difficult at all Not difficult at all Not difficult at all    BP Readings from Last 3 Encounters:  07/10/23 116/68  04/21/23 110/60  01/07/23 124/78    Physical Exam Vitals and nursing note reviewed.  HENT:     Head: Normocephalic.     Right Ear: External ear normal.     Left Ear: External ear normal.     Nose: Nose normal.     Mouth/Throat:     Mouth: Mucous membranes are moist.  Eyes:     General: No scleral icterus.       Right eye: No discharge.        Left eye: No discharge.     Conjunctiva/sclera: Conjunctivae normal.     Pupils: Pupils are equal, round, and reactive to light.  Neck:     Thyroid: No thyromegaly.     Vascular: No JVD.     Trachea: No tracheal deviation.  Cardiovascular:     Rate and Rhythm: Normal rate and regular rhythm.  Heart sounds: Normal heart sounds. No murmur heard.    No friction rub. No gallop.  Pulmonary:     Effort: No respiratory distress.     Breath  sounds: Normal breath sounds. No wheezing or rales.  Abdominal:     General: Bowel sounds are normal.     Palpations: Abdomen is soft. There is no mass.     Tenderness: There is no abdominal tenderness. There is no guarding or rebound.  Musculoskeletal:        General: No tenderness. Normal range of motion.     Cervical back: Normal range of motion and neck supple.  Lymphadenopathy:     Cervical: No cervical adenopathy.  Skin:    General: Skin is warm.     Findings: No rash.  Neurological:     Mental Status: He is alert and oriented to person, place, and time.     Cranial Nerves: No cranial nerve deficit.     Deep Tendon Reflexes: Reflexes are normal and symmetric.     Wt Readings from Last 3 Encounters:  07/10/23 163 lb (73.9 kg)  04/21/23 165 lb (74.8 kg)  01/07/23 155 lb (70.3 kg)    BP 116/68   Pulse 64   Ht 5\' 4"  (1.626 m)   Wt 163 lb (73.9 kg)   SpO2 99%   BMI 27.98 kg/m   Assessment and Plan: 1. Hyperlipidemia, unspecified hyperlipidemia type Chronic.  Controlled.  Stable.  Asymptomatic.  Patient tolerating Crestor 10 mg once a day without myalgias or muscle weakness.  Continue at current dosing and patient has also been reemphasized low-cholesterol low triglyceride dietary guidelines will check CMP to monitor liver function test as well as lipid panel to monitor LDL control. - rosuvastatin (CRESTOR) 10 MG tablet; Take 1 tablet daily  Dispense: 90 tablet; Refill: 1 - Comprehensive metabolic panel - Lipid Panel With LDL/HDL Ratio  2. Primary insomnia Chronic.  Controlled.  Stable.  Patient is asymptomatic when he takes trazodone 50 mg 1 to 2 tablets nightly.  Will continue at current dosing. - traZODone (DESYREL) 50 MG tablet; TAKE 1 TO 2 TABLETS AT BEDTIME AS NEEDED FOR SLEEP  Dispense: 180 tablet; Refill: 1  3. Prediabetes Chronic.  Controlled.  Stable.  A1c's have been controlled with dietary approach of eliminating concentrated sweets and limiting  carbohydrates.  Will continue CMP for electrolytes and GFR.  Will continue to monitor A1c for current status of prediabetic control. - Comprehensive metabolic panel - Hemoglobin A1c     Elizabeth Sauer, MD

## 2023-07-10 NOTE — Patient Instructions (Signed)

## 2023-07-11 ENCOUNTER — Encounter: Payer: Self-pay | Admitting: Family Medicine

## 2023-07-11 LAB — COMPREHENSIVE METABOLIC PANEL
ALT: 25 [IU]/L (ref 0–44)
AST: 18 [IU]/L (ref 0–40)
Albumin: 4.1 g/dL (ref 3.9–4.9)
Alkaline Phosphatase: 117 [IU]/L (ref 44–121)
BUN/Creatinine Ratio: 19 (ref 10–24)
BUN: 26 mg/dL (ref 8–27)
Bilirubin Total: 0.3 mg/dL (ref 0.0–1.2)
CO2: 23 mmol/L (ref 20–29)
Calcium: 9.3 mg/dL (ref 8.6–10.2)
Chloride: 105 mmol/L (ref 96–106)
Creatinine, Ser: 1.36 mg/dL — ABNORMAL HIGH (ref 0.76–1.27)
Globulin, Total: 2.4 g/dL (ref 1.5–4.5)
Glucose: 107 mg/dL — ABNORMAL HIGH (ref 70–99)
Potassium: 4.7 mmol/L (ref 3.5–5.2)
Sodium: 140 mmol/L (ref 134–144)
Total Protein: 6.5 g/dL (ref 6.0–8.5)
eGFR: 56 mL/min/{1.73_m2} — ABNORMAL LOW (ref >59)

## 2023-07-11 LAB — LIPID PANEL WITH LDL/HDL RATIO
Cholesterol, Total: 164 mg/dL (ref 100–199)
HDL: 73 mg/dL (ref >39)
LDL Chol Calc (NIH): 77 mg/dL (ref 0–99)
LDL/HDL Ratio: 1.1 ratio (ref 0.0–3.6)
Triglycerides: 75 mg/dL (ref 0–149)
VLDL Cholesterol Cal: 14 mg/dL (ref 5–40)

## 2023-07-11 LAB — HEMOGLOBIN A1C
Est. average glucose Bld gHb Est-mCnc: 120 mg/dL
Hgb A1c MFr Bld: 5.8 % — ABNORMAL HIGH (ref 4.8–5.6)

## 2023-07-14 DIAGNOSIS — J301 Allergic rhinitis due to pollen: Secondary | ICD-10-CM | POA: Diagnosis not present

## 2023-07-15 ENCOUNTER — Ambulatory Visit: Payer: Medicare PPO

## 2023-07-15 VITALS — BP 134/76 | Ht 64.0 in | Wt 162.6 lb

## 2023-07-15 DIAGNOSIS — Z0000 Encounter for general adult medical examination without abnormal findings: Secondary | ICD-10-CM

## 2023-07-15 DIAGNOSIS — Z23 Encounter for immunization: Secondary | ICD-10-CM

## 2023-07-15 NOTE — Patient Instructions (Addendum)
Blake Patrick , Thank you for taking time to come for your Medicare Wellness Visit. I appreciate your ongoing commitment to your health goals. Please review the following plan we discussed and let me know if I can assist you in the future.   Referrals/Orders/Follow-Ups/Clinician Recommendations: COVID given  This is a list of the screening recommended for you and due dates:  Health Maintenance  Topic Date Due   COVID-19 Vaccine (8 - 2023-24 season) 05/03/2023   Colon Cancer Screening  03/08/2024   Medicare Annual Wellness Visit  07/14/2024   Pneumonia Vaccine  Completed   Flu Shot  Completed   Hepatitis C Screening  Completed   Zoster (Shingles) Vaccine  Completed   HPV Vaccine  Aged Out   DTaP/Tdap/Td vaccine  Discontinued    Advanced directives: (ACP Link)Information on Advanced Care Planning can be found at  [facility] of [place] Advance Health Care Directives Advance Health Care Directives ([email or web address]   Next Medicare Annual Wellness Visit scheduled for next year: Yes   07/20/24 @ 3:30 pm in person

## 2023-07-15 NOTE — Progress Notes (Signed)
Subjective:   Lemmy Baquera is a 72 y.o. male who presents for Medicare Annual/Subsequent preventive examination.  Visit Complete: In person  Cardiac Risk Factors include: advanced age (>51men, >23 women);dyslipidemia;male gender;sedentary lifestyle     Objective:    Today's Vitals   07/15/23 1517 07/15/23 1520  BP: 134/76   Weight: 162 lb 9.6 oz (73.8 kg)   Height: 5\' 4"  (1.626 m)   PainSc:  0-No pain   Body mass index is 27.91 kg/m.     07/15/2023    3:26 PM 10/04/2022   10:28 AM 06/25/2022    8:51 AM 06/24/2021    8:58 AM 04/29/2021   12:54 PM 02/08/2021    2:04 PM 06/20/2020    9:12 AM  Advanced Directives  Does Patient Have a Medical Advance Directive? No No Yes No Yes Yes Yes  Type of Advance Directive   Living will;Healthcare Power of [employer] Power of [place];Living will Living will Healthcare Power of [place];Living will  Does patient want to make changes to medical advance directive?     No - Patient declined    Copy of Healthcare Power of Attorney in Chart?   Yes - validated most recent copy scanned in chart (See row information)  No - copy requested  No - copy requested  Would patient like information on creating a medical advance directive? No - Patient declined   Yes (MAU/Ambulatory/Procedural Areas - Information given)       Current Medications (verified) Outpatient Encounter Medications as of 07/15/2023  Medication Sig   albuterol (PROVENTIL) (2.5 MG/3ML) 0.083% nebulizer solution Take 2.5 mg by nebulization every 2 (two) hours as needed.   APPLE CIDER VINEGAR PO Take 500 mg by mouth.   Calcium Carbonate-Vitamin D (RA CALCIUM PLUS VITAMIN D PO) Take 525 mg by mouth 2 (two) times daily.   Cholecalciferol (VITAMIN D) 50 MCG (2000 UT) CAPS Take 1 capsule by mouth daily.   Coenzyme Q10 (COQ-10) 100 MG CAPS Take 1 each by mouth 2 (two) times daily.   DULoxetine (CYMBALTA) 20 MG capsule Take 60 mg by mouth 2 (two) times daily. Dr Fransisca Kaufmann    EPINEPHrine 0.3 mg/0.3 mL IJ SOAJ injection    fexofenadine (ALLEGRA) 180 MG tablet Take 180 mg by mouth daily.   fluticasone (FLONASE) 50 MCG/ACT nasal spray Place 2 sprays into both nostrils daily.   gabapentin (NEURONTIN) 300 MG capsule 600 mg 2 (two) times daily.   Glucosamine-Chondroitin (GLUCOSAMINE CHONDR COMPLEX PO) Take 3,700 mg by mouth daily.   ibuprofen (ADVIL) 600 MG tablet Take 600 mg by mouth every 6 (six) hours as needed.   Lasmiditan Succinate (REYVOW) 100 MG TABS Take by mouth.   naratriptan (AMERGE) 2.5 MG tablet as needed.   omeprazole (PRILOSEC) 20 MG capsule TAKE 1 CAPSULE EVERY DAY   polycarbophil (FIBERCON) 625 MG tablet Take 1,250 mg by mouth 2 (two) times daily.   Riboflavin 400 MG TABS Take 1 tablet by mouth daily.   rosuvastatin (CRESTOR) 10 MG tablet Take 1 tablet daily   traZODone (DESYREL) 50 MG tablet TAKE 1 TO 2 TABLETS AT BEDTIME AS NEEDED FOR SLEEP   vitamin C (ASCORBIC ACID) 500 MG tablet Take 500 mg by mouth daily.   Vitamin E 180 MG CAPS Take 1 capsule by mouth daily.   Vitamins-Lipotropics (LIPO-FLAVONOID PLUS PO) Take 1,000 mg by mouth 2 (two) times daily.   Zinc 50 MG CAPS Take 1 capsule by mouth daily.   budesonide (PULMICORT) 0.5  MG/2ML nebulizer solution Inhale into the lungs.   montelukast (SINGULAIR) 10 MG tablet Take 1 tablet by mouth daily. Dr A.   No facility-administered encounter medications on file as of 07/15/2023.    Allergies (verified) Patient has no known allergies.   History: Past Medical History:  Diagnosis Date   Allergy 06/07/20   Went for Allergy Test   Arthritis    hands, fingers   Asthma 02/22/2021   Cataract    Replaced in 2019   Colon polyp    Diverticulosis    GERD (gastroesophageal reflux disease)    occasionally   History of placement of ear tubes    2012- came out shortly after   Hyperlipidemia    Migraine    daily migraines   Sinusitis    chronic   Tendonitis    both ankles   Tinnitus    Past  Surgical History:  Procedure Laterality Date   BIOPSY N/A 04/29/2021   Procedure: BIOPSY;  Surgeon: Pasty Spillers, MD;  Location: [facility];  Service: Endoscopy;  Laterality: N/A;   ELBOW SURGERY Right 2007   ESOPHAGOGASTRODUODENOSCOPY (EGD) WITH PROPOFOL N/A 04/29/2021   Procedure: ESOPHAGOGASTRODUODENOSCOPY (EGD) WITH PROPOFOL;  Surgeon: Pasty Spillers, MD;  Location: [facility];  Service: Endoscopy;  Laterality: N/A;   ESOPHAGOGASTRODUODENOSCOPY (EGD) WITH PROPOFOL N/A 07/22/2021   Procedure: ESOPHAGOGASTRODUODENOSCOPY (EGD) WITH PROPOFOL;  Surgeon: Pasty Spillers, MD;  Location: [facility];  Service: Endoscopy;  Laterality: N/A;   ETHMOIDECTOMY Bilateral 10/27/2019   Procedure: TOTAL ETHMOIDECTOMY WITH FRONTAL SINUOTOMY;  Surgeon: Vernie Murders, MD;  Location: [facility];  Service: ENT;  Laterality: Bilateral;   EYE SURGERY  2019   Replaced   HAND SURGERY  1983   HERNIA REPAIR  1995   IMAGE GUIDED SINUS SURGERY Bilateral 10/27/2019   Procedure: IMAGE GUIDED SINUS SURGERY;  Surgeon: Vernie Murders, MD;  Location: [facility];  Service: ENT;  Laterality: Bilateral;  need stryker disk placed disk on or charge nurse desk 2-17   kp   IMAGE GUIDED SINUS SURGERY Bilateral 04/26/2020   Procedure: IMAGE GUIDED SINUS SURGERY;  Surgeon: Vernie Murders, MD;  Location: [facility];  Service: ENT;  Laterality: Bilateral;  need stryker disk gave Steward Drone disk on 8-23 kp   MENISCUS REPAIR Left 2018   SEPTOPLASTY Bilateral 10/27/2019   Procedure: SEPTOPLASTY REVISION;  Surgeon: Vernie Murders, MD;  Location: [facility];  Service: ENT;  Laterality: Bilateral;   SPHENOIDECTOMY Right 04/26/2020   Procedure: Selina Cooley;  Surgeon: Vernie Murders, MD;  Location: [facility];  Service: ENT;  Laterality: Right;   TONSILLECTOMY  1964   Family History  Problem Relation Age of Onset   Cancer Father    Social History    Socioeconomic History   Marital status: Married    Spouse name: Not on file   Number of children: 1   Years of education: Not on file   Highest education level: 12th grade  Occupational History   Not on file  Tobacco Use   Smoking status: Former    Current packs/day: 0.00    Average packs/day: 1 pack/day for 26.0 years (26.0 ttl pk-yrs)    Types: Cigarettes    Start date: 09/01/1962    Quit date: 09/01/1988    Years since quitting: 34.8   Smokeless tobacco: Never  Vaping Use   Vaping status: Never Used  Substance and Sexual Activity   Alcohol use: Yes    Alcohol/week: 7.0  standard drinks of alcohol    Types: 7 Cans of beer per week    Comment: Social drinking only   Drug use: Never   Sexual activity: Yes  Other Topics Concern   Not on file  Social History Narrative   ** Merged History Encounter **       Social Determinants of Health   Financial Resource Strain: Low Risk  (07/15/2023)   Overall Financial Resource Strain (CARDIA)    Difficulty of Paying Living Expenses: Not hard at all  Food Insecurity: No Food Insecurity (07/15/2023)   Hunger Vital Sign    Worried About Running Out of Food in the Last Year: Never true    Ran Out of Food in the Last Year: Never true  Transportation Needs: No Transportation Needs (07/15/2023)   PRAPARE - Administrator, Civil Service (Medical): No    Lack of Transportation (Non-Medical): No  Physical Activity: Insufficiently Active (07/15/2023)   Exercise Vital Sign    Days of Exercise per Week: 2 days    Minutes of Exercise per Session: 30 min  Stress: No Stress Concern Present (07/15/2023)   [employer] of Occupational Health - Occupational Stress Questionnaire    Feeling of Stress : Not at all  Social Connections: Moderately Isolated (07/15/2023)   Social Connection and Isolation Panel [NHANES]    Frequency of Communication with Friends and Family: More than three times a week    Frequency of Social Gatherings  with Friends and Family: Three times a week    Attends Religious Services: Never    Active Member of Clubs or Organizations: No    Attends Banker Meetings: Never    Marital Status: Married    Tobacco Counseling Counseling given: Not Answered   Clinical Intake:  Pre-visit preparation completed: Yes  Pain : No/denies pain Pain Score: 0-No pain     BMI - recorded: 27.91 Nutritional Status: BMI 25 -29 Overweight Nutritional Risks: None Diabetes: No  How often do you need to have someone help you when you read instructions, pamphlets, or other written materials from your doctor or pharmacy?: 1 - Never  Interpreter Needed?: No  Information entered by :: Kennedy Bucker, LPN   Activities of Daily Living    07/15/2023    3:28 PM  In your present state of health, do you have any difficulty performing the following activities:  Hearing? 1  Vision? 0  Difficulty concentrating or making decisions? 0  Walking or climbing stairs? 0  Dressing or bathing? 0  Doing errands, shopping? 0  Preparing Food and eating ? N  Using the Toilet? N  In the past six months, have you accidently leaked urine? N  Do you have problems with loss of bowel control? N  Managing your Medications? N  Managing your Finances? N  Housekeeping or managing your Housekeeping? N    Patient Care Team: Duanne Limerick, MD as PCP - General (Family Medicine) Vida Rigger, MD as Consulting Physician (Pulmonary Disease) Pasty Spillers, MD (Inactive) as Consulting Physician (Gastroenterology) Alver Fisher, MD as Referring Physician (Neurology) Marin Olp, MD as Referring Physician (Urology) Vernie Murders, MD (Otolaryngology) [facility], [facility]  Indicate any recent Medical Services you may have received from other than [facility] providers in the past year (date may be approximate).     Assessment:   This is a routine wellness examination for Maanas.  Hearing/Vision  screen Hearing Screening - Comments:: Wears aids- both ears Vision  Screening - Comments:: Wears glasses- Dr.Benfield   Goals Addressed             This Visit's Progress    DIET - EAT MORE FRUITS AND VEGETABLES         Depression Screen    07/15/2023    3:25 PM 04/21/2023    9:25 AM 01/07/2023    8:03 AM 01/07/2023    7:59 AM 10/09/2022    3:21 PM 07/09/2022    8:41 AM 06/25/2022    8:50 AM  PHQ 2/9 Scores  PHQ - 2 Score 0 0 0 0 0 0 0  PHQ- 9 Score 0 0 0 0 0 0 1    Fall Risk    07/15/2023    3:28 PM 04/21/2023    9:24 AM 01/07/2023    7:59 AM 10/09/2022    3:21 PM 07/09/2022    8:41 AM  Fall Risk   Falls in the past year? 0 0 0 0 0  Comment  2 years ago     Number falls in past yr: 0 0 0 0 0  Injury with Fall? 0 1 0 0 0  Comment  had fusion of foot/ ankle     Risk for fall due to : No Fall Risks No Fall Risks No Fall Risks No Fall Risks No Fall Risks  Follow up Falls prevention discussed;Falls evaluation completed Falls evaluation completed Falls evaluation completed Falls evaluation completed Falls evaluation completed    MEDICARE RISK AT HOME: Medicare Risk at Home Any stairs in or around the home?: Yes If so, are there any without handrails?: No Home free of loose throw rugs in walkways, pet beds, electrical cords, etc?: Yes Adequate lighting in your home to reduce risk of falls?: Yes Life alert?: No Use of a cane, walker or w/c?: No Grab bars in the bathroom?: No Shower chair or bench in shower?: No Elevated toilet seat or a handicapped toilet?: No  TIMED UP AND GO:  Was the test performed?  Yes  Length of time to ambulate 10 feet: 4 sec Gait steady and fast without use of assistive device    Cognitive Function:        07/15/2023    3:29 PM 06/25/2022    8:54 AM 06/20/2020    9:12 AM  6CIT Screen  What Year? 0 points 0 points 0 points  What month? 0 points 0 points 0 points  What time? 0 points 0 points 0 points  Count back from 20 0 points 0 points 0  points  Months in reverse 0 points 0 points 0 points  Repeat phrase 2 points 2 points 0 points  Total Score 2 points 2 points 0 points    Immunizations Immunization History  Administered Date(s) Administered   Fluad Quad(high Dose 65+) 07/07/2019, 06/14/2020, 05/22/2021, 05/22/2022   Fluad Trivalent(High Dose 65+) 06/02/2023   PFIZER Comirnaty(Gray Top)Covid-19 Tri-Sucrose Vaccine 04/08/2021   PFIZER(Purple Top)SARS-COV-2 Vaccination 10/07/2019, 11/01/2019, 06/25/2020, 06/25/2020   Pfizer Covid-19 Vaccine Bivalent Booster 23yrs & up 10/18/2021   Pfizer(Comirnaty)Fall Seasonal Vaccine 12 years and older 06/25/2022   Pneumococcal Conjugate-13 12/24/2017   Pneumococcal Polysaccharide-23 11/22/2019   Respiratory Syncytial Virus Vaccine,Recomb Aduvanted(Arexvy) 06/07/2022   Zoster Recombinant(Shingrix) 09/17/2021, 06/04/2022   Zoster, Live 09/01/2010    TDAP status: Due, Education has been provided regarding the importance of this vaccine. Advised may receive this vaccine at local pharmacy or Health Dept. Aware to provide a copy of the vaccination record if obtained from  local pharmacy or Health Dept. Verbalized acceptance and understanding.  Flu Vaccine status: Up to date  Pneumococcal vaccine status: Up to date  Covid-19 vaccine status: Completed vaccines  Qualifies for Shingles Vaccine? Yes   Zostavax completed No   Shingrix Completed?: Yes  Screening Tests Health Maintenance  Topic Date Due   COVID-19 Vaccine (8 - 2023-24 season) 05/03/2023   Colonoscopy  03/08/2024   Medicare Annual Wellness (AWV)  07/14/2024   Pneumonia Vaccine 1+ Years old  Completed   INFLUENZA VACCINE  Completed   Hepatitis C Screening  Completed   Zoster Vaccines- Shingrix  Completed   HPV VACCINES  Aged Out   DTaP/Tdap/Td  Discontinued    Health Maintenance  Health Maintenance Due  Topic Date Due   COVID-19 Vaccine (8 - 2023-24 season) 05/03/2023    Colorectal cancer screening: Type of  screening: Colonoscopy. Completed 03/09/19. Repeat every 5 years  Lung Cancer Screening: (Low Dose CT Chest recommended if Age 69-80 years, 20 pack-year currently smoking OR have quit w/in 15years.) does not qualify.    Additional Screening:  Hepatitis C Screening: does qualify; Completed 11/03/18  Vision Screening: Recommended annual ophthalmology exams for early detection of glaucoma and other disorders of the eye. Is the patient up to date with their annual eye exam?  Yes  Who is the provider or what is the name of the office in which the patient attends annual eye exams? Dr.Benfield If pt is not established with a provider, would they like to be referred to a provider to establish care? No .   Dental Screening: Recommended annual dental exams for proper oral hygiene   Community Resource Referral / Chronic Care Management: CRR required this visit?  No   CCM required this visit?  No     Plan:     I have personally reviewed and noted the following in the patient's chart:   Medical and social history Use of alcohol, tobacco or illicit drugs  Current medications and supplements including opioid prescriptions. Patient is not currently taking opioid prescriptions. Functional ability and status Nutritional status Physical activity Advanced directives List of other physicians Hospitalizations, surgeries, and ER visits in previous 12 months Vitals Screenings to include cognitive, depression, and falls Referrals and appointments  In addition, I have reviewed and discussed with patient certain preventive protocols, quality metrics, and best practice recommendations. A written personalized care plan for preventive services as well as general preventive health recommendations were provided to patient.     Hal Hope, LPN   81/19/1478   After Visit Summary: (In Person-Declined) Patient declined AVS at this time.- on mychart  Nurse Notes: COVID shot given

## 2023-07-21 DIAGNOSIS — J301 Allergic rhinitis due to pollen: Secondary | ICD-10-CM | POA: Diagnosis not present

## 2023-07-28 DIAGNOSIS — J301 Allergic rhinitis due to pollen: Secondary | ICD-10-CM | POA: Diagnosis not present

## 2023-08-04 DIAGNOSIS — J301 Allergic rhinitis due to pollen: Secondary | ICD-10-CM | POA: Diagnosis not present

## 2023-08-11 DIAGNOSIS — J301 Allergic rhinitis due to pollen: Secondary | ICD-10-CM | POA: Diagnosis not present

## 2023-08-13 DIAGNOSIS — M542 Cervicalgia: Secondary | ICD-10-CM | POA: Diagnosis not present

## 2023-08-13 DIAGNOSIS — R519 Headache, unspecified: Secondary | ICD-10-CM | POA: Diagnosis not present

## 2023-08-14 DIAGNOSIS — J45909 Unspecified asthma, uncomplicated: Secondary | ICD-10-CM | POA: Diagnosis not present

## 2023-08-14 DIAGNOSIS — S01512A Laceration without foreign body of oral cavity, initial encounter: Secondary | ICD-10-CM | POA: Diagnosis not present

## 2023-08-14 DIAGNOSIS — X58XXXA Exposure to other specified factors, initial encounter: Secondary | ICD-10-CM | POA: Diagnosis not present

## 2023-08-14 NOTE — ED Triage Notes (Signed)
Pt states he was laughing and eating and bit his tongue and it was bleeding, bleeding has stopped. Pt has lac to the top of the tongue.

## 2023-08-15 ENCOUNTER — Emergency Department
Admission: EM | Admit: 2023-08-15 | Discharge: 2023-08-15 | Disposition: A | Discharge status: Home / Self Care | Payer: Medicare PPO | Attending: Emergency Medicine | Admitting: Emergency Medicine

## 2023-08-15 DIAGNOSIS — S01512A Laceration without foreign body of oral cavity, initial encounter: Secondary | ICD-10-CM

## 2023-08-15 NOTE — ED Provider Notes (Signed)
[facility] Provider Note    Event Date/Time   First MD Initiated Contact with Patient 08/15/23 0103     (approximate)   History   Mouth Injury   HPI  Blake Patrick is a 72 y.o. male who presents to the ED from home with a chief complaint of tongue laceration.  Patient was laughing and eating and bit his tongue.  Came to the ED because of his bleeding which is now resolved.  Presents with laceration to the top of his tongue.  Tetanus is up-to-date.  Patient denies anticoagulant use.  Voices no other complaints or injuries.     Past Medical History   Past Medical History:  Diagnosis Date   Allergy 06/07/20   Went for Allergy Test   Arthritis    hands, fingers   Asthma 02/22/2021   Cataract    Replaced in 2019   Colon polyp    Diverticulosis    GERD (gastroesophageal reflux disease)    occasionally   History of placement of ear tubes    2012- came out shortly after   Hyperlipidemia    Migraine    daily migraines   Sinusitis    chronic   Tendonitis    both ankles   Tinnitus      Active Problem List   Patient Active Problem List   Diagnosis Date Noted   COVID 04/24/2022   Acute esophagitis    Columnar-lined esophagus    Hiatal hernia    Hyperlipidemia, mixed 07/01/2021   Insomnia disorder 07/01/2021   Environmental and seasonal allergies 07/01/2021   Esophageal dysphagia    Duodenal erythema    Esophagitis    Esophageal stenosis    Severe persistent asthma without complication 02/22/2021   Closed dislocation of tarsal joint of right foot 02/09/2021   Migraines 12/21/2017     Past Surgical History   Past Surgical History:  Procedure Laterality Date   BIOPSY N/A 04/29/2021   Procedure: BIOPSY;  Surgeon: Pasty Spillers, MD;  Location: [facility];  Service: Endoscopy;  Laterality: N/A;   ELBOW SURGERY Right 2007   ESOPHAGOGASTRODUODENOSCOPY (EGD) WITH PROPOFOL N/A 04/29/2021   Procedure: ESOPHAGOGASTRODUODENOSCOPY  (EGD) WITH PROPOFOL;  Surgeon: Pasty Spillers, MD;  Location: [facility];  Service: Endoscopy;  Laterality: N/A;   ESOPHAGOGASTRODUODENOSCOPY (EGD) WITH PROPOFOL N/A 07/22/2021   Procedure: ESOPHAGOGASTRODUODENOSCOPY (EGD) WITH PROPOFOL;  Surgeon: Pasty Spillers, MD;  Location: [facility];  Service: Endoscopy;  Laterality: N/A;   ETHMOIDECTOMY Bilateral 10/27/2019   Procedure: TOTAL ETHMOIDECTOMY WITH FRONTAL SINUOTOMY;  Surgeon: Vernie Murders, MD;  Location: [facility];  Service: ENT;  Laterality: Bilateral;   EYE SURGERY  2019   Replaced   HAND SURGERY  1983   HERNIA REPAIR  1995   IMAGE GUIDED SINUS SURGERY Bilateral 10/27/2019   Procedure: IMAGE GUIDED SINUS SURGERY;  Surgeon: Vernie Murders, MD;  Location: [facility];  Service: ENT;  Laterality: Bilateral;  need stryker disk placed disk on or charge nurse desk 2-17   kp   IMAGE GUIDED SINUS SURGERY Bilateral 04/26/2020   Procedure: IMAGE GUIDED SINUS SURGERY;  Surgeon: Vernie Murders, MD;  Location[facility];  Service: ENT;  Laterality: Bilateral;  need stryker disk gave Steward Drone disk on 8-23 kp   MENISCUS REPAIR Left 2018   SEPTOPLASTY Bilateral 10/27/2019   Procedure: SEPTOPLASTY REVISION;  Surgeon: Vernie Murders, MD;  Location: [facility];  Service: ENT;  Laterality: Bilateral;  SPHENOIDECTOMY Right 04/26/2020   Procedure: SPHENOIDECTOMY;  Surgeon: Vernie Murders, MD;  Location: [facility];  Service: ENT;  Laterality: Right;   TONSILLECTOMY  1964     Home Medications   Prior to Admission medications   Medication Sig Start Date End Date Taking? Authorizing Provider  albuterol (PROVENTIL) (2.5 MG/3ML) 0.083% nebulizer solution Take 2.5 mg by nebulization every 2 (two) hours as needed. 06/26/22   [provider]  APPLE CIDER VINEGAR PO Take 500 mg by mouth.    [provider]  budesonide (PULMICORT) 0.5 MG/2ML nebulizer solution Inhale into the  lungs. 11/14/20 07/10/23  [provider]  Calcium Carbonate-Vitamin D (RA CALCIUM PLUS VITAMIN D PO) Take 525 mg by mouth 2 (two) times daily.    [provider]  Cholecalciferol (VITAMIN D) 50 MCG (2000 UT) CAPS Take 1 capsule by mouth daily.    [provider]  Coenzyme Q10 (COQ-10) 100 MG CAPS Take 1 each by mouth 2 (two) times daily.    [provider]  DULoxetine (CYMBALTA) 20 MG capsule Take 60 mg by mouth 2 (two) times daily. Dr Fransisca Kaufmann    [provider]  EPINEPHrine 0.3 mg/0.3 mL IJ SOAJ injection  06/13/20   [provider]  fexofenadine (ALLEGRA) 180 MG tablet Take 180 mg by mouth daily.    [provider]  fluticasone (FLONASE) 50 MCG/ACT nasal spray Place 2 sprays into both nostrils daily. 04/16/20   [provider]  gabapentin (NEURONTIN) 300 MG capsule 600 mg 2 (two) times daily. 02/24/23   [provider]  Glucosamine-Chondroitin (GLUCOSAMINE CHONDR COMPLEX PO) Take 3,700 mg by mouth daily.    [provider]  ibuprofen (ADVIL) 600 MG tablet Take 600 mg by mouth every 6 (six) hours as needed.    [provider]  Lasmiditan Succinate (REYVOW) 100 MG TABS Take by mouth.    [provider]  montelukast (SINGULAIR) 10 MG tablet Take 1 tablet by mouth daily. Dr A. 12/25/21 04/21/23  [provider]  naratriptan (AMERGE) 2.5 MG tablet as needed. 05/24/20   [provider]  omeprazole (PRILOSEC) 20 MG capsule TAKE 1 CAPSULE EVERY DAY 05/06/23   Wyline Mood, MD  polycarbophil (FIBERCON) 625 MG tablet Take 1,250 mg by mouth 2 (two) times daily.    [provider]  Riboflavin 400 MG TABS Take 1 tablet by mouth daily.    [provider]  rosuvastatin (CRESTOR) 10 MG tablet Take 1 tablet daily 07/10/23   Duanne Limerick, MD  traZODone (DESYREL) 50 MG tablet TAKE 1 TO 2 TABLETS AT BEDTIME AS NEEDED FOR SLEEP 07/10/23   Duanne Limerick, MD  vitamin C (ASCORBIC  ACID) 500 MG tablet Take 500 mg by mouth daily.    [provider]  Vitamin E 180 MG CAPS Take 1 capsule by mouth daily.    [provider]  Vitamins-Lipotropics (LIPO-FLAVONOID PLUS PO) Take 1,000 mg by mouth 2 (two) times daily.    [provider]  Zinc 50 MG CAPS Take 1 capsule by mouth daily.    [provider]     Allergies  Patient has no known allergies.   Family History   Family History  Problem Relation Age of Onset   Cancer Father      Physical Exam  Triage Vital Signs: ED Triage Vitals [08/14/23 2139]  Encounter Vitals Group     BP (!) 143/93     Systolic BP Percentile  Diastolic BP Percentile      Pulse Rate 62     Resp 20     Temp 97.9 F (36.6 C)     Temp Source Oral     SpO2 99 %     Weight      Height      Head Circumference      Peak Flow      Pain Score 3     Pain Loc      Pain Education      Exclude from Growth Chart     Updated Vital Signs: BP (!) 146/102 (BP Location: Right Arm)   Pulse 88   Temp 97.9 F (36.6 C) (Oral)   Resp 19   SpO2 95%    General: Awake, no distress.  CV:  Good peripheral perfusion.  Resp:  Normal effort.  Abd:  No distention.  Other:  Small curved fingernail shaped superficial laceration to top of right central tongue without active bleeding.  Well-approximated.   ED Results / Procedures / Treatments  Labs (all labs ordered are listed, but only abnormal results are displayed) Labs Reviewed - No data to display   EKG  None   RADIOLOGY None   Official radiology report(s): No results found.   PROCEDURES:  Critical Care performed: No  Procedures   MEDICATIONS ORDERED IN ED: Medications - No data to display   IMPRESSION / MDM / ASSESSMENT AND PLAN / ED COURSE  I reviewed the triage vital signs and the nursing notes.                             72 year old male presenting with minor tongue laceration, bleeding resolved.  Recommend supportive  treatment, avoid salty foods x 1 week.  Strict return precautions given.  Patient verbalizes understanding and agrees with plan of care.  Patient's presentation is most consistent with acute, uncomplicated illness.    FINAL CLINICAL IMPRESSION(S) / ED DIAGNOSES   Final diagnoses:  Tongue laceration, initial encounter     Rx / DC Orders   ED Discharge Orders     None        Note:  This document was prepared using Dragon voice recognition software and may include unintentional dictation errors.   Irean Hong, MD 08/15/23 [phone]

## 2023-08-15 NOTE — Discharge Instructions (Signed)
Avoid salty foods for the next week.  Return to the ER for recurrent or worsening symptoms or other concerns.

## 2023-08-17 ENCOUNTER — Other Ambulatory Visit: Payer: Self-pay | Admitting: Family Medicine

## 2023-08-17 DIAGNOSIS — E785 Hyperlipidemia, unspecified: Secondary | ICD-10-CM

## 2023-08-18 DIAGNOSIS — M542 Cervicalgia: Secondary | ICD-10-CM | POA: Diagnosis not present

## 2023-08-18 DIAGNOSIS — R519 Headache, unspecified: Secondary | ICD-10-CM | POA: Diagnosis not present

## 2023-08-18 DIAGNOSIS — J301 Allergic rhinitis due to pollen: Secondary | ICD-10-CM | POA: Diagnosis not present

## 2023-08-21 DIAGNOSIS — R519 Headache, unspecified: Secondary | ICD-10-CM | POA: Diagnosis not present

## 2023-08-21 DIAGNOSIS — M542 Cervicalgia: Secondary | ICD-10-CM | POA: Diagnosis not present

## 2023-08-28 DIAGNOSIS — J301 Allergic rhinitis due to pollen: Secondary | ICD-10-CM | POA: Diagnosis not present

## 2023-09-01 DIAGNOSIS — M542 Cervicalgia: Secondary | ICD-10-CM | POA: Diagnosis not present

## 2023-09-01 DIAGNOSIS — R519 Headache, unspecified: Secondary | ICD-10-CM | POA: Diagnosis not present

## 2023-09-01 DIAGNOSIS — J301 Allergic rhinitis due to pollen: Secondary | ICD-10-CM | POA: Diagnosis not present

## 2023-09-04 DIAGNOSIS — M542 Cervicalgia: Secondary | ICD-10-CM | POA: Diagnosis not present

## 2023-09-04 DIAGNOSIS — R519 Headache, unspecified: Secondary | ICD-10-CM | POA: Diagnosis not present

## 2023-09-08 DIAGNOSIS — M79671 Pain in right foot: Secondary | ICD-10-CM | POA: Diagnosis not present

## 2023-09-08 DIAGNOSIS — M216X1 Other acquired deformities of right foot: Secondary | ICD-10-CM | POA: Diagnosis not present

## 2023-09-08 DIAGNOSIS — R519 Headache, unspecified: Secondary | ICD-10-CM | POA: Diagnosis not present

## 2023-09-08 DIAGNOSIS — J301 Allergic rhinitis due to pollen: Secondary | ICD-10-CM | POA: Diagnosis not present

## 2023-09-08 DIAGNOSIS — L851 Acquired keratosis [keratoderma] palmaris et plantaris: Secondary | ICD-10-CM | POA: Diagnosis not present

## 2023-09-08 DIAGNOSIS — M21621 Bunionette of right foot: Secondary | ICD-10-CM | POA: Diagnosis not present

## 2023-09-08 DIAGNOSIS — G8929 Other chronic pain: Secondary | ICD-10-CM | POA: Diagnosis not present

## 2023-09-08 DIAGNOSIS — M542 Cervicalgia: Secondary | ICD-10-CM | POA: Diagnosis not present

## 2023-09-10 DIAGNOSIS — C672 Malignant neoplasm of lateral wall of bladder: Secondary | ICD-10-CM | POA: Diagnosis not present

## 2023-09-10 DIAGNOSIS — N401 Enlarged prostate with lower urinary tract symptoms: Secondary | ICD-10-CM | POA: Diagnosis not present

## 2023-09-10 DIAGNOSIS — R350 Frequency of micturition: Secondary | ICD-10-CM | POA: Diagnosis not present

## 2023-09-11 DIAGNOSIS — R519 Headache, unspecified: Secondary | ICD-10-CM | POA: Diagnosis not present

## 2023-09-11 DIAGNOSIS — M542 Cervicalgia: Secondary | ICD-10-CM | POA: Diagnosis not present

## 2023-09-22 DIAGNOSIS — M542 Cervicalgia: Secondary | ICD-10-CM | POA: Diagnosis not present

## 2023-09-22 DIAGNOSIS — R519 Headache, unspecified: Secondary | ICD-10-CM | POA: Diagnosis not present

## 2023-09-22 DIAGNOSIS — J301 Allergic rhinitis due to pollen: Secondary | ICD-10-CM | POA: Diagnosis not present

## 2023-09-28 DIAGNOSIS — B351 Tinea unguium: Secondary | ICD-10-CM | POA: Diagnosis not present

## 2023-09-29 DIAGNOSIS — R519 Headache, unspecified: Secondary | ICD-10-CM | POA: Diagnosis not present

## 2023-09-29 DIAGNOSIS — M542 Cervicalgia: Secondary | ICD-10-CM | POA: Diagnosis not present

## 2023-09-29 DIAGNOSIS — J301 Allergic rhinitis due to pollen: Secondary | ICD-10-CM | POA: Diagnosis not present

## 2023-10-06 DIAGNOSIS — J301 Allergic rhinitis due to pollen: Secondary | ICD-10-CM | POA: Diagnosis not present

## 2023-10-06 DIAGNOSIS — M542 Cervicalgia: Secondary | ICD-10-CM | POA: Diagnosis not present

## 2023-10-06 DIAGNOSIS — R519 Headache, unspecified: Secondary | ICD-10-CM | POA: Diagnosis not present

## 2023-10-13 DIAGNOSIS — M791 Myalgia, unspecified site: Secondary | ICD-10-CM | POA: Diagnosis not present

## 2023-10-13 DIAGNOSIS — J301 Allergic rhinitis due to pollen: Secondary | ICD-10-CM | POA: Diagnosis not present

## 2023-10-13 DIAGNOSIS — M542 Cervicalgia: Secondary | ICD-10-CM | POA: Diagnosis not present

## 2023-10-20 DIAGNOSIS — J301 Allergic rhinitis due to pollen: Secondary | ICD-10-CM | POA: Diagnosis not present

## 2023-10-27 DIAGNOSIS — J301 Allergic rhinitis due to pollen: Secondary | ICD-10-CM | POA: Diagnosis not present

## 2023-12-03 ENCOUNTER — Other Ambulatory Visit: Payer: Self-pay | Admitting: Family Medicine

## 2023-12-03 DIAGNOSIS — F5101 Primary insomnia: Secondary | ICD-10-CM

## 2023-12-22 ENCOUNTER — Ambulatory Visit: Payer: Self-pay | Admitting: Family Medicine

## 2024-01-04 ENCOUNTER — Other Ambulatory Visit: Payer: Self-pay | Admitting: Internal Medicine

## 2024-01-04 DIAGNOSIS — Z136 Encounter for screening for cardiovascular disorders: Secondary | ICD-10-CM

## 2024-01-04 DIAGNOSIS — Z87891 Personal history of nicotine dependence: Secondary | ICD-10-CM

## 2024-02-14 ENCOUNTER — Other Ambulatory Visit: Payer: Self-pay | Admitting: Gastroenterology

## 2024-02-16 ENCOUNTER — Other Ambulatory Visit: Payer: Self-pay | Admitting: Orthopedic Surgery

## 2024-02-16 DIAGNOSIS — M533 Sacrococcygeal disorders, not elsewhere classified: Secondary | ICD-10-CM

## 2024-02-20 ENCOUNTER — Ambulatory Visit
Admission: RE | Admit: 2024-02-20 | Discharge: 2024-02-20 | Disposition: A | Discharge status: Home / Self Care | Source: Ambulatory Visit | Attending: Orthopedic Surgery | Admitting: Orthopedic Surgery

## 2024-02-20 DIAGNOSIS — M533 Sacrococcygeal disorders, not elsewhere classified: Secondary | ICD-10-CM

## 2024-05-27 ENCOUNTER — Telehealth: Payer: Self-pay

## 2024-05-27 NOTE — Telephone Encounter (Signed)
 Called patient about med refills he see [facility].

## 2024-07-20 ENCOUNTER — Other Ambulatory Visit: Payer: Self-pay | Admitting: Podiatry

## 2024-07-27 ENCOUNTER — Encounter
Admission: RE | Admit: 2024-07-27 | Discharge: 2024-07-27 | Disposition: A | Discharge status: Home / Self Care | Source: Ambulatory Visit | Attending: Podiatry | Admitting: Podiatry

## 2024-07-27 ENCOUNTER — Other Ambulatory Visit: Payer: Self-pay

## 2024-07-27 VITALS — Ht 64.0 in | Wt 164.0 lb

## 2024-07-27 DIAGNOSIS — Z01810 Encounter for preprocedural cardiovascular examination: Secondary | ICD-10-CM

## 2024-07-27 DIAGNOSIS — J45909 Unspecified asthma, uncomplicated: Secondary | ICD-10-CM

## 2024-07-27 DIAGNOSIS — Z01812 Encounter for preprocedural laboratory examination: Secondary | ICD-10-CM

## 2024-07-27 DIAGNOSIS — E782 Mixed hyperlipidemia: Secondary | ICD-10-CM

## 2024-07-27 DIAGNOSIS — M21611 Bunion of right foot: Secondary | ICD-10-CM

## 2024-07-27 HISTORY — DX: Unspecified asthma, uncomplicated: J45.909

## 2024-07-27 NOTE — Patient Instructions (Addendum)
 Your procedure is scheduled on:08-05-24 Friday Report to the Registration Desk on the 1st floor of the [facility].Then proceed to the 2nd floor Surgery Desk To find out your arrival time, please call [phone] between 1PM - 3PM on:08-04-24 Thursday If your arrival time is 6:00 am, do not arrive before that time as the [facility] entrance doors do not open until 6:00 am.  REMEMBER: Instructions that are not followed completely may result in serious medical risk, up to and including death; or upon the discretion of your surgeon and anesthesiologist your surgery may need to be rescheduled.  Do not eat food after midnight the night before surgery.  No gum chewing or hard candies.  You may however, drink CLEAR liquids up to 2 hours before you are scheduled to arrive for your surgery. Do not drink anything within 2 hours of your scheduled arrival time.  Clear liquids include: - water   - apple juice without pulp - gatorade (not RED colors) - black coffee or tea (Do NOT add milk or creamers to the coffee or tea) Do NOT drink anything that is not on this list.  In addition, your doctor has ordered for you to drink the provided:  Ensure Pre-Surgery Clear Carbohydrate Drink  Drinking this carbohydrate drink up to two hours before surgery helps to reduce insulin resistance and improve patient outcomes. Please complete drinking 2 hours before scheduled arrival time.  One week prior to surgery:Last dose will be on 07-28-24 Thursday Stop Anti-inflammatories (NSAIDS) such as meloxicam  (MOBIC ) Advil, Aleve, Ibuprofen, Motrin, Naproxen, Naprosyn and Aspirin based products such as Excedrin, Goody's Powder, BC Powder. Stop ANY OVER THE COUNTER supplements until after surgery.  You may however, continue to take Tylenol  if needed for pain up until the day of surgery.  Continue taking all of your other prescription medications up until the day of surgery.  ON THE DAY OF SURGERY ONLY TAKE THESE  MEDICATIONS WITH SIPS OF WATER : -DULoxetine (CYMBALTA)  -fexofenadine (ALLEGRA)  -gabapentin (NEURONTIN)  -omeprazole  (PRILOSEC)   Use your Albuterol and Budesonide/Ipratropium Bromide Nebulizers the morning of surgery  No Alcohol for 24 hours before or after surgery.  No Smoking including e-cigarettes for 24 hours before surgery.  No chewable tobacco products for at least 6 hours before surgery.  No nicotine patches on the day of surgery.  Do not use any recreational drugs for at least a week (preferably 2 weeks) before your surgery.  Please be advised that the combination of cocaine and anesthesia may have negative outcomes, up to and including death. If you test positive for cocaine, your surgery will be cancelled.  On the morning of surgery brush your teeth with toothpaste and water , you may rinse your mouth with mouthwash if you wish. Do not swallow any toothpaste or mouthwash.  Use CHG Soap as directed on instruction sheet.  Do not wear jewelry, make-up, hairpins, clips or nail polish.  For welded (permanent) jewelry: bracelets, anklets, waist bands, etc.  Please have this removed prior to surgery.  If it is not removed, there is a chance that hospital personnel will need to cut it off on the day of surgery.  Do not wear lotions, powders, or perfumes.   Do not shave body hair from the neck down 48 hours before surgery.  Contact lenses, hearing aids and dentures may not be worn into surgery.  Do not bring valuables to the hospital. [facility] is not responsible for any missing/lost belongings or valuables.   Notify  your doctor if there is any change in your medical condition (cold, fever, infection).  Wear comfortable clothing (specific to your surgery type) to the hospital.  After surgery, you can help prevent lung complications by doing breathing exercises.  Take deep breaths and cough every 1-2 hours. Your doctor may order a device called an Incentive Spirometer to  help you take deep breaths. When coughing or sneezing, hold a pillow firmly against your incision with both hands. This is called "splinting." Doing this helps protect your incision. It also decreases belly discomfort.  If you are being admitted to the hospital overnight, leave your suitcase in the car. After surgery it may be brought to your room.  In case of increased patient census, it may be necessary for you, the patient, to continue your postoperative care in the Same Day Surgery department.  If you are being discharged the day of surgery, you will not be allowed to drive home. You will need a responsible individual to drive you home and stay with you for 24 hours after surgery.   If you are taking public transportation, you will need to have a responsible individual with you.  Please call the Pre-admissions Testing Dept. at [phone] if you have any questions about these instructions.  Surgery Visitation Policy:  Patients having surgery or a procedure may have two visitors.  Children under the age of 68 must have an adult with them who is not the patient.                                                                                                             Preparing for Surgery with CHLORHEXIDINE GLUCONATE (CHG) Soap  Chlorhexidine Gluconate (CHG) Soap  o An antiseptic cleaner that kills germs and bonds with the skin to continue killing germs even after washing  o Used for showering the night before surgery and morning of surgery  Before surgery, you can play an important role by reducing the number of germs on your skin.  CHG (Chlorhexidine gluconate) soap is an antiseptic cleanser which kills germs and bonds with the skin to continue killing germs even after washing.  Please do not use if you have an allergy to CHG or antibacterial soaps. If your skin becomes reddened/irritated stop using the CHG.  1. Shower the NIGHT BEFORE SURGERY with CHG soap.  2. If you  choose to wash your hair, wash your hair first as usual with your normal shampoo.  3. After shampooing, rinse your hair and body thoroughly to remove the shampoo.  4. Use CHG as you would any other liquid soap. You can apply CHG directly to the skin and wash gently with a clean washcloth.  5. Apply the CHG soap to your body only from the neck down. Do not use on open wounds or open sores. Avoid contact with your eyes, ears, mouth, and genitals (private parts). Wash face and genitals (private parts) with your normal soap.  6. Wash thoroughly, paying special attention to the area where your surgery will be  performed.  7. Thoroughly rinse your body with warm water .  8. Do not shower/wash with your normal soap after using and rinsing off the CHG soap.  9. Do not use lotions, oils, etc., after showering with CHG.  10. Pat yourself dry with a clean towel.  11. Wear clean pajamas to bed the night before surgery.  12. Place clean sheets on your bed the night of your shower and do not sleep with pets.  13. Do not apply any deodorants/lotions/powders.  14. Please wear clean clothes to the hospital.  15. Remember to brush your teeth with your regular toothpaste.   Merchandiser, Retail to address health-related social needs:  [email or web address]

## 2024-08-01 ENCOUNTER — Encounter
Admission: RE | Admit: 2024-08-01 | Discharge: 2024-08-01 | Disposition: A | Source: Ambulatory Visit | Attending: Podiatry

## 2024-08-01 DIAGNOSIS — J45909 Unspecified asthma, uncomplicated: Secondary | ICD-10-CM | POA: Insufficient documentation

## 2024-08-01 DIAGNOSIS — Z01812 Encounter for preprocedural laboratory examination: Secondary | ICD-10-CM

## 2024-08-01 DIAGNOSIS — Z01810 Encounter for preprocedural cardiovascular examination: Secondary | ICD-10-CM | POA: Diagnosis not present

## 2024-08-01 DIAGNOSIS — Z01818 Encounter for other preprocedural examination: Secondary | ICD-10-CM | POA: Insufficient documentation

## 2024-08-01 DIAGNOSIS — E782 Mixed hyperlipidemia: Secondary | ICD-10-CM

## 2024-08-01 LAB — CBC
HCT: 38.8 % — ABNORMAL LOW (ref 39.0–52.0)
Hemoglobin: 12.9 g/dL — ABNORMAL LOW (ref 13.0–17.0)
MCH: 31.2 pg (ref 26.0–34.0)
MCHC: 33.2 g/dL (ref 30.0–36.0)
MCV: 93.9 fL (ref 80.0–100.0)
Platelets: 240 K/uL (ref 150–400)
RBC: 4.13 MIL/uL — ABNORMAL LOW (ref 4.22–5.81)
RDW: 13 % (ref 11.5–15.5)
WBC: 7.7 K/uL (ref 4.0–10.5)
nRBC: 0 % (ref 0.0–0.2)

## 2024-08-01 LAB — BASIC METABOLIC PANEL WITH GFR
Anion gap: 9 (ref 5–15)
BUN: 25 mg/dL — ABNORMAL HIGH (ref 8–23)
CO2: 26 mmol/L (ref 22–32)
Calcium: 8.8 mg/dL — ABNORMAL LOW (ref 8.9–10.3)
Chloride: 105 mmol/L (ref 98–111)
Creatinine, Ser: 1.04 mg/dL (ref 0.61–1.24)
GFR, Estimated: >60 mL/min (ref >60)
Glucose, Bld: 154 mg/dL — ABNORMAL HIGH (ref 70–99)
Potassium: 4 mmol/L (ref 3.5–5.1)
Sodium: 139 mmol/L (ref 135–145)

## 2024-08-05 ENCOUNTER — Ambulatory Visit
Admission: RE | Admit: 2024-08-05 | Discharge: 2024-08-05 | Disposition: A | Source: Ambulatory Visit | Attending: Podiatry | Admitting: Podiatry

## 2024-08-05 ENCOUNTER — Other Ambulatory Visit: Payer: Self-pay

## 2024-08-05 ENCOUNTER — Encounter: Payer: Self-pay | Admitting: Podiatry

## 2024-08-05 ENCOUNTER — Encounter: Admission: RE | Disposition: A | Payer: Self-pay | Source: Ambulatory Visit | Attending: Podiatry

## 2024-08-05 ENCOUNTER — Ambulatory Visit: Payer: Self-pay | Admitting: Anesthesiology

## 2024-08-05 ENCOUNTER — Encounter: Payer: Self-pay | Admitting: Anesthesiology

## 2024-08-05 ENCOUNTER — Ambulatory Visit

## 2024-08-05 DIAGNOSIS — M21621 Bunionette of right foot: Secondary | ICD-10-CM

## 2024-08-05 SURGERY — OSTEOTOMY, WEIL
Anesthesia: General | Site: Fifth Toe | Laterality: Right

## 2024-08-05 MED ORDER — OXYCODONE HCL 5 MG/5ML PO SOLN: 5.0000 mg | Freq: Once | ORAL | Status: DC | PRN

## 2024-08-05 MED ORDER — CHLORHEXIDINE GLUCONATE 0.12 % MT SOLN
15.0000 mL | Freq: Once | OROMUCOSAL | Status: AC
  Administered 2024-08-05: 15 mL via OROMUCOSAL

## 2024-08-05 MED ORDER — GLYCOPYRROLATE 0.2 MG/ML IJ SOLN
INTRAMUSCULAR | Status: DC | PRN
  Administered 2024-08-05: .2 mg via INTRAVENOUS

## 2024-08-05 MED ORDER — CEFAZOLIN SODIUM-DEXTROSE 2-4 GM/100ML-% IV SOLN
2.0000 g | INTRAVENOUS | Status: AC
  Administered 2024-08-05: 2 g via INTRAVENOUS

## 2024-08-05 MED ORDER — CHLORHEXIDINE GLUCONATE 0.12 % MT SOLN
OROMUCOSAL | Status: AC
  Filled 2024-08-05: qty 15

## 2024-08-05 MED ORDER — PROPOFOL 500 MG/50ML IV EMUL
INTRAVENOUS | Status: DC | PRN
  Administered 2024-08-05: 125 ug/kg/min via INTRAVENOUS

## 2024-08-05 MED ORDER — ONDANSETRON HCL 4 MG/2ML IJ SOLN
INTRAMUSCULAR | Status: DC | PRN
  Administered 2024-08-05 (×2): 4 mg via INTRAVENOUS

## 2024-08-05 MED ORDER — ACETAMINOPHEN 10 MG/ML IV SOLN: 1000.0000 mg | Freq: Once | INTRAVENOUS | Status: DC | PRN

## 2024-08-05 MED ORDER — DEXAMETHASONE SOD PHOSPHATE PF 10 MG/ML IJ SOLN
INTRAMUSCULAR | Status: DC | PRN
  Administered 2024-08-05: 5 mg via INTRAVENOUS

## 2024-08-05 MED ORDER — LACTATED RINGERS IV SOLN: INTRAVENOUS | Status: DC

## 2024-08-05 MED ORDER — PROPOFOL 10 MG/ML IV BOLUS
INTRAVENOUS | Status: DC | PRN
  Administered 2024-08-05: 50 mg via INTRAVENOUS

## 2024-08-05 MED ORDER — HYDROCODONE-ACETAMINOPHEN 5-325 MG PO TABS: 1.0000 | ORAL_TABLET | Freq: Four times a day (QID) | ORAL | 0 refills | Status: AC | PRN

## 2024-08-05 MED ORDER — PHENYLEPHRINE 80 MCG/ML (10ML) SYRINGE FOR IV PUSH (FOR BLOOD PRESSURE SUPPORT)
PREFILLED_SYRINGE | INTRAVENOUS | Status: DC | PRN
  Administered 2024-08-05: 80 ug via INTRAVENOUS

## 2024-08-05 MED ORDER — FENTANYL CITRATE (PF) 100 MCG/2ML IJ SOLN
INTRAMUSCULAR | Status: AC
  Filled 2024-08-05: qty 2

## 2024-08-05 MED ORDER — MIDAZOLAM HCL 2 MG/2ML IJ SOLN
INTRAMUSCULAR | Status: AC
  Filled 2024-08-05: qty 2

## 2024-08-05 MED ORDER — DROPERIDOL 2.5 MG/ML IJ SOLN: 0.6250 mg | Freq: Once | INTRAMUSCULAR | Status: DC | PRN

## 2024-08-05 MED ORDER — DEXMEDETOMIDINE HCL IN NACL 200 MCG/50ML IV SOLN
INTRAVENOUS | Status: DC | PRN
  Administered 2024-08-05: 12 ug via INTRAVENOUS

## 2024-08-05 MED ORDER — 0.9 % SODIUM CHLORIDE (POUR BTL) OPTIME
TOPICAL | Status: DC | PRN
  Administered 2024-08-05: 1000 mL

## 2024-08-05 MED ORDER — CEFAZOLIN SODIUM-DEXTROSE 2-4 GM/100ML-% IV SOLN
INTRAVENOUS | Status: AC
  Filled 2024-08-05: qty 100

## 2024-08-05 MED ORDER — MIDAZOLAM HCL (PF) 2 MG/2ML IJ SOLN
INTRAMUSCULAR | Status: DC | PRN
  Administered 2024-08-05: 2 mg via INTRAVENOUS

## 2024-08-05 MED ORDER — OXYCODONE HCL 5 MG PO TABS: 5.0000 mg | ORAL_TABLET | Freq: Once | ORAL | Status: DC | PRN

## 2024-08-05 MED ORDER — BUPIVACAINE LIPOSOME 1.3 % IJ SUSP
INTRAMUSCULAR | Status: DC | PRN
  Administered 2024-08-05: 10 mL

## 2024-08-05 MED ORDER — BUPIVACAINE HCL (PF) 0.5 % IJ SOLN
INTRAMUSCULAR | Status: AC
  Filled 2024-08-05: qty 30

## 2024-08-05 MED ORDER — EPHEDRINE SULFATE-NACL 50-0.9 MG/10ML-% IV SOSY
PREFILLED_SYRINGE | INTRAVENOUS | Status: DC | PRN
  Administered 2024-08-05: 5 mg via INTRAVENOUS

## 2024-08-05 MED ORDER — BUPIVACAINE HCL (PF) 0.5 % IJ SOLN
INTRAMUSCULAR | Status: DC | PRN
  Administered 2024-08-05: 15 mL

## 2024-08-05 MED ORDER — PROPOFOL 1000 MG/100ML IV EMUL
INTRAVENOUS | Status: AC
  Filled 2024-08-05: qty 100

## 2024-08-05 MED ORDER — FENTANYL CITRATE (PF) 100 MCG/2ML IJ SOLN: 25.0000 ug | INTRAMUSCULAR | Status: DC | PRN

## 2024-08-05 MED ORDER — SODIUM CHLORIDE (PF) 0.9 % IJ SOLN
INTRAMUSCULAR | Status: AC
  Filled 2024-08-05: qty 50

## 2024-08-05 MED ORDER — ORAL CARE MOUTH RINSE: 15.0000 mL | Freq: Once | OROMUCOSAL | Status: AC

## 2024-08-05 SURGICAL SUPPLY — 38 items
BIT DRILL 1.7 LNG CANN (DRILL) IMPLANT
BLADE OSC/SAGITTAL 5.5X25 (BLADE) ×1 IMPLANT
BLADE OSC/SAGITTAL MD 9X18.5 (BLADE) IMPLANT
BLADE SURG 15 STRL LF DISP TIS (BLADE) ×2 IMPLANT
BNDG ELASTIC 4X5.8 VLCR NS LF (GAUZE/BANDAGES/DRESSINGS) ×2 IMPLANT
BNDG ESMARCH 4X12 STRL LF (GAUZE/BANDAGES/DRESSINGS) ×1 IMPLANT
BNDG GAUZE DERMACEA FLUFF 4 (GAUZE/BANDAGES/DRESSINGS) ×1 IMPLANT
BNDG STRETCH GAUZE 3IN X12FT (GAUZE/BANDAGES/DRESSINGS) ×1 IMPLANT
CUFF TOURN SGL QUICK 12 (TOURNIQUET CUFF) IMPLANT
CUFF TOURN SGL QUICK 18X4 (TOURNIQUET CUFF) IMPLANT
DRAPE FLUOR MINI C-ARM 54X84 (DRAPES) ×1 IMPLANT
DURAPREP 26ML APPLICATOR (WOUND CARE) ×1 IMPLANT
ELECTRODE REM PT RTRN 9FT ADLT (ELECTROSURGICAL) ×1 IMPLANT
GAUZE SPONGE 4X4 12PLY STRL (GAUZE/BANDAGES/DRESSINGS) ×1 IMPLANT
GAUZE XEROFORM 1X8 LF (GAUZE/BANDAGES/DRESSINGS) ×1 IMPLANT
GLOVE BIO SURGEON STRL SZ7 (GLOVE) ×1 IMPLANT
GLOVE BIOGEL PI IND STRL 7.5 (GLOVE) ×1 IMPLANT
GOWN STRL REUS W/ TWL LRG LVL3 (GOWN DISPOSABLE) ×2 IMPLANT
KIT TURNOVER KIT A (KITS) ×1 IMPLANT
LABEL OR SOLS (LABEL) ×1 IMPLANT
MANIFOLD NEPTUNE II (INSTRUMENTS) ×1 IMPLANT
NDL HYPO 25X1 1.5 SAFETY (NEEDLE) ×3 IMPLANT
NDL SAFETY ECLIP 18X1.5 (MISCELLANEOUS) ×1 IMPLANT
NS IRRIG 500ML POUR BTL (IV SOLUTION) ×1 IMPLANT
PACK EXTREMITY ARMC (MISCELLANEOUS) ×1 IMPLANT
PENCIL SMOKE EVACUATOR (MISCELLANEOUS) ×1 IMPLANT
SCREW 2.0X14 HEADED (Screw) IMPLANT
SCREW CANN HEAD ST 2.0X13 (Screw) IMPLANT
SOLN STERILE WATER 500 ML (IV SOLUTION) ×1 IMPLANT
STRIP CLOSURE SKIN 1/4X4 (GAUZE/BANDAGES/DRESSINGS) ×1 IMPLANT
SUT MNCRL AB 4-0 PS2 18 (SUTURE) IMPLANT
SUT VIC AB 3-0 SH 27X BRD (SUTURE) IMPLANT
SUT VICRYL AB 3-0 FS1 BRD 27IN (SUTURE) ×1 IMPLANT
SYR 10ML LL (SYRINGE) ×2 IMPLANT
TRAP FLUID SMOKE EVACUATOR (MISCELLANEOUS) ×1 IMPLANT
WIRE SMOOTH TROCAR .9MMX150MML (WIRE) IMPLANT
WIRE Z .045 C-WIRE SPADE TIP (WIRE) ×2 IMPLANT
WIRE Z .062 C-WIRE SPADE TIP (WIRE) ×2 IMPLANT

## 2024-08-05 NOTE — Anesthesia Preprocedure Evaluation (Signed)
 Anesthesia Evaluation  Patient identified by MRN, date of birth, ID band Patient awake    Reviewed: Allergy & Precautions, H&P , NPO status , Patient's Chart, lab work & pertinent test results  Airway Mallampati: II  TM Distance: >3 FB Neck ROM: full    Dental no notable dental hx.    Pulmonary asthma , former smoker   Pulmonary exam normal        Cardiovascular negative cardio ROS Normal cardiovascular exam     Neuro/Psych negative neurological ROS  negative psych ROS   GI/Hepatic Neg liver ROS,GERD  ,,  Endo/Other  negative endocrine ROS    Renal/GU negative Renal ROS  negative genitourinary   Musculoskeletal  (+) Arthritis ,    Abdominal   Peds  Hematology  (+) Blood dyscrasia, anemia   Anesthesia Other Findings Past Medical History: 06/07/2020: Allergy     Comment:  Went for Allergy Test No date: Arthritis     Comment:  hands, fingers No date: Cataract     Comment:  Replaced in 2019 No date: Colon polyp No date: Diverticulosis No date: GERD (gastroesophageal reflux disease)     Comment:  occasionally No date: History of placement of ear tubes     Comment:  2012- came out shortly after No date: Hyperlipidemia No date: Migraine     Comment:  daily migraines No date: Severe asthma No date: Sinusitis     Comment:  chronic No date: Tendonitis     Comment:  both ankles No date: Tinnitus  Past Surgical History: 04/29/2021: BIOPSY; N/A     Comment:  Procedure: BIOPSY;  Surgeon: Janalyn Keene NOVAK, MD;                Location: [facility];  Service: Endoscopy;                Laterality: N/A; No date: CATARACT EXTRACTION, BILATERAL 2007: ELBOW SURGERY; Right 04/29/2021: ESOPHAGOGASTRODUODENOSCOPY (EGD) WITH PROPOFOL ; N/A     Comment:  Procedure: ESOPHAGOGASTRODUODENOSCOPY (EGD) WITH               PROPOFOL ;  Surgeon: Janalyn Keene NOVAK, MD;  Location:               [facility];   Service: Endoscopy;  Laterality:               N/A; 07/22/2021: ESOPHAGOGASTRODUODENOSCOPY (EGD) WITH PROPOFOL ; N/A     Comment:  Procedure: ESOPHAGOGASTRODUODENOSCOPY (EGD) WITH               PROPOFOL ;  Surgeon: Janalyn Keene NOVAK, MD;  Location:               [facility];  Service: Endoscopy;  Laterality:               N/A; 10/27/2019: ETHMOIDECTOMY; Bilateral     Comment:  Procedure: TOTAL ETHMOIDECTOMY WITH FRONTAL SINUOTOMY;                Surgeon: Edda Mt, MD;  Location: [facility];  Service: ENT;  Laterality: Bilateral; 2019: EYE SURGERY     Comment:  Replaced No date: HAND SURGERY; Right     Comment:  x4 1995: HERNIA REPAIR 10/27/2019: IMAGE GUIDED SINUS SURGERY; Bilateral     Comment:  Procedure: IMAGE GUIDED SINUS SURGERY;  Surgeon:  Juengel, Paul, MD;  Location: [facility];                Service: ENT;  Laterality: Bilateral;  need stryker               disk placed disk on or charge nurse desk 2-17   kp 04/26/2020: IMAGE GUIDED SINUS SURGERY; Bilateral     Comment:  Procedure: IMAGE GUIDED SINUS SURGERY;  Surgeon:               Edda Mt, MD;  Location: [facility];                Service: ENT;  Laterality: Bilateral;  need stryker               disk gave Erminio disk on 8-23 kp 2018: MENISCUS REPAIR; Left 10/27/2019: SEPTOPLASTY; Bilateral     Comment:  Procedure: SEPTOPLASTY REVISION;  Surgeon: Edda Mt, MD;  Location: [facility];  Service: ENT;               Laterality: Bilateral; 04/26/2020: SPHENOIDECTOMY; Right     Comment:  Procedure: SPHENOIDECTOMY;  Surgeon: Edda Mt, MD;               Location: [facility];  Service: ENT;                Laterality: Right; 1964: TONSILLECTOMY  BMI    Body Mass Index: 28.00 kg/m      Reproductive/Obstetrics negative OB ROS                              Anesthesia  Physical Anesthesia Plan  ASA: 2  Anesthesia Plan: General   Post-op Pain Management: Regional block*   Induction: Intravenous  PONV Risk Score and Plan: Propofol  infusion and TIVA  Airway Management Planned: Natural Airway  Additional Equipment:   Intra-op Plan:   Post-operative Plan:   Informed Consent: I have reviewed the patients History and Physical, chart, labs and discussed the procedure including the risks, benefits and alternatives for the proposed anesthesia with the patient or authorized representative who has indicated his/her understanding and acceptance.     Dental Advisory Given  Plan Discussed with: CRNA and Surgeon  Anesthesia Plan Comments:          Anesthesia Quick Evaluation

## 2024-08-05 NOTE — Discharge Instructions (Signed)

## 2024-08-05 NOTE — Op Note (Signed)
 PODIATRY / FOOT AND ANKLE SURGERY OPERATIVE REPORT    SURGEON: Prentice Lee, DPM  PRE-OPERATIVE DIAGNOSIS:  1.  Right tailor's bunion 2.  Right forefoot varus  POST-OPERATIVE DIAGNOSIS: Same  PROCEDURE(S): Right tailor's bunionectomy with osteotomy  HEMOSTASIS: Right ankle tourniquet  ANESTHESIA: MAC  ESTIMATED BLOOD LOSS: 10 cc  FINDING(S): 1.  Plantarflexed fifth metatarsal with tailor's bunion  PATHOLOGY/SPECIMEN(S): None  INDICATIONS:   Blake Patrick is a 73 y.o. male who presents with a painful callus subfifth metatarsal phalangeal area that is previously been a small ulceration due to plantar pressure.  Patient had a previous midfoot and rear foot fusion and as a result has forefoot varus and walks hard on the fifth metatarsal phalange joint.  He also has a tailor's bunion to this area.  He develops a painful callus that has required paring and wound care as well as offloading but continues to have pain discomfort.  Discussed all treatment options with the patient both conservative and surgical attempts at correction include potential risks and complications and at this time patient is elected for procedure today consisting of right fifth metatarsal tailor's bunionectomy with osteotomy.  Discussed with patient that this may not be enough to reduce pressure to the area but it is the most minimal thing we could try to take pressure off the area.  If does not work then patient may have to consider a revision midfoot fusion to try to bring the foot out of varus.  Patient understands.  Consent obtained prior to procedure.  DESCRIPTION: After obtaining full informed written consent, the patient was brought back to the operating room and placed supine upon the operating table.  The patient received IV antibiotics prior to induction.  After obtaining adequate anesthesia, the patient was prepped and draped in the standard fashion.  20 cc of half percent Marcaine  plain was injected about the  right fifth ray.  An Esmarch bandage used to exsanguinate the right lower extremity pneumatic ankle tourniquet was inflated.  Attention was then directed to the right fifth metatarsal phalange joint dorsal laterally where incision was made along the tailor's bunion deformity lateral to the tendon of the extensor digitorum longus.  The incision was deepened through the subcutaneous tissues utilizing sharp and blunt dissection and care was taken to identify and retract all vital neurovascular structures no venous contributories were cauterized necessary.  At this time a periosteal and capsular incision was made into the fifth metatarsal phalangeal joint.  The capsular and periosteal tissue was reflected medially and laterally thereby exposing the fifth metatarsal phalange joint at the operative site.  The lateral eminence was then resected and passed off the operative site.  There also appeared to be a large dorsal exostosis which was resected and passed off the operative site with a sagittal bone saw.  At this time a dorsiflexor a wedge osteotomy was then performed through the distal shaft and was long and oblique in nature.  The dorsal wedge was removed.  The osteotomy was completed.  Once the osteotomy was completed the capital fragment was shifted and rotated medially.  Once this was performed the capital fragment was held intact with temporary fixation wires x 3 for 2.0 cannulated screws and Paragon 28.  This was checked under fluoroscopic guidance.  Overall the fifth metatarsal appeared to be dorsiflexed more and the intermetatarsal angle as well as the lateral deviation angle appeared to be greatly improved within normal range.  The guidewires that were placed all appear to be  in good position.  At this time three 2.0 cannulated Paragon 28 screws were then placed utilizing standard AO principles and techniques of the appropriate size and length and were checked under fluoroscopic guidance.  Good compression  was noted across the osteotomy site.  The temporary wires were removed.  The slight lateral overhang was then resected and passed off the operative site.  The surgical site was flushed with copious amounts normal sterile saline.  The periosteal and capsular structures reapproximated well coapted with 3-0 Vicryl, the subcutaneous tissue was reapproximated well coapted with 4-0 Monocryl.  The skin was then reapproximated well coapted with 4-0 nylon in a horizontal mattress type stitching.  An additional 10 cc of Exparel  was injected about the operative area.  The pneumatic ankle tourniquet was deflated and a prompt hyperemic response was noted all distal right foot.  The patient tolerated the procedure and anesthesia well and was transferred to the recovery with vital signs stable and vascular status intact all toes the right foot.  Following a period of postoperative monitoring the patient be discharged home with the appropriate orders, instructions, medications.  COMPLICATIONS: None  CONDITION: Good, stable  Prentice Lee, DPM

## 2024-08-05 NOTE — H&P (Signed)
 HISTORY AND PHYSICAL INTERVAL NOTE:  08/05/2024  12:25 PM  Lynwood Flies  has presented today for surgery, with the diagnosis of Tailor's bunion of right foot M21.621 Metatarsus adductus of right foot Q66.221 Acquired forefoot varus of right foot M21.6X1 Chronic pain in right foot M79.671, G89.29 Post-traumatic osteoarthritis of right foot M19.171.  The various methods of treatment have been discussed with the patient.  No guarantees were given.  After consideration of risks, benefits and other options for treatment, the patient has consented to surgery.  I have reviewed the patients' chart and labs.    PROCEDURE: RIGHT TAILORS BUNIONECTOMY WITH ELEVATING OSTEOTOMY  A history and physical examination was performed in my office.  The patient was reexamined.  There have been no changes to this history and physical examination.  Prentice Lee, DPM

## 2024-08-05 NOTE — Anesthesia Procedure Notes (Signed)
 Procedure Name: General with mask airway Date/Time: 08/05/2024 1:09 PM  Performed by: Ledora Duncan, CRNAPre-anesthesia Checklist: Patient identified, Emergency Drugs available, Suction available and Patient being monitored Patient Re-evaluated:Patient Re-evaluated prior to induction Oxygen Delivery Method: Simple face mask Induction Type: IV induction Placement Confirmation: positive ETCO2 and breath sounds checked- equal and bilateral Dental Injury: Teeth and Oropharynx as per pre-operative assessment

## 2024-08-05 NOTE — Transfer of Care (Signed)
 Immediate Anesthesia Transfer of Care Note  Patient: Blake Patrick  Procedure(s) Performed: VERDENE FURLONG (Right: Fifth Toe)  Patient Location: PACU  Anesthesia Type:General  Level of Consciousness: drowsy and patient cooperative  Airway & Oxygen Therapy: Patient Spontanous Breathing and Patient connected to face mask oxygen  Post-op Assessment: Report given to RN and Post -op Vital signs reviewed and stable  Post vital signs: Reviewed and stable  Last Vitals:  Vitals Value Taken Time  BP 111/80 08/05/24 14:39  Temp 36 C 08/05/24 14:40  Pulse 64 08/05/24 14:41  Resp 10 08/05/24 14:41  SpO2 100 % 08/05/24 14:41  Vitals shown include unfiled device data.  Last Pain:  Vitals:   08/05/24 1205  TempSrc: Temporal  PainSc: 0-No pain         Complications: No notable events documented.

## 2024-08-06 NOTE — Anesthesia Postprocedure Evaluation (Signed)
 Anesthesia Post Note  Patient: Blake Patrick  Procedure(s) Performed: OSTEOTOMY, WEIL (Right: Fifth Toe)  Patient location during evaluation: PACU Anesthesia Type: General Level of consciousness: awake and alert Pain management: pain level controlled Vital Signs Assessment: post-procedure vital signs reviewed and stable Respiratory status: spontaneous breathing, nonlabored ventilation and respiratory function stable Cardiovascular status: blood pressure returned to baseline and stable Postop Assessment: no apparent nausea or vomiting Anesthetic complications: no   No notable events documented.   Last Vitals:  Vitals:   08/05/24 1530 08/05/24 1552  BP: 139/89 (!) 144/86  Pulse: 65 62  Resp: (!) 28 16  Temp: (!) 36.1 C (!) 36.1 C  SpO2: 100% 98%    Last Pain:  Vitals:   08/05/24 1552  TempSrc:   PainSc: 0-No pain                 Camellia Merilee Louder

## 2024-08-08 ENCOUNTER — Encounter: Payer: Self-pay | Admitting: Podiatry

## 2024-09-27 ENCOUNTER — Other Ambulatory Visit: Payer: Self-pay

## 2024-09-28 NOTE — Progress Notes (Unsigned)
 "   09/29/2024 Blake Patrick [id number] 07/09/1951  Gastroenterology Office Note    Referring Provider: Jeffie Cheryl BRAVO, MD Primary Care Physician:  Jeffie Cheryl BRAVO, MD  Primary GI Provider: Celestia Rima, NP; Jinny Carmine, MD    Chief Complaint   Chief Complaint  Patient presents with   New Patient (Initial Visit)    Feels a knot under his sternum said he feels liquid and foods go around the knot-  Constipation- takes dulcolax-takes probiotics-goes maybe every 3 days     History of Present Illness   Blake Patrick is a 74 y.o. male with PMHX of Barrett's esophagus presenting today for GERD, constipation.  Discussed the use of AI scribe software for clinical note transcription with the patient, who gave verbal consent to proceed.  Persistent upper abdominal bloating and a sensation of fullness have been present since early December. Bloating is localized near the sternum or xiphoid process, sometimes described as a 'knot' sensation and occasional soreness, particularly when swallowing. No classic constipation, but requires Dulcolax every 2-3 days for bowel movements. Loose stools occur after Dulcolax use. No dietary triggers identified. Fluid intake is 16-32 ounces of water  daily, with a morning cup of coffee, no sodas. Rare NSAID use, consumes 1-2 beers daily. Denies nausea or vomiting. A single episode of black, mushy, sticky stool occurred about a month ago, with subsequent normal brown stools.   Intermittent dysphagia is described as a sensation of food sitting at the lower chest and liquids feeling as if he goes around a ball in the lower esophagus. Occasional sharp, jolting pains in the area, not always associated with eating. No current difficulty swallowing solids or liquids, and no nocturnal symptoms. Omeprazole  is taken daily for acid reflux, which controls symptoms when used consistently.    Patient last seen by Dr. Therisa 07/22/2022 for follow-up for dysphagia.    07/22/2021 EGD - Salmon-colored mucosa suspicious for short-segment Barrett's esophagus. Biopsied.  - Small hiatal hernia.  - Normal stomach.  - Normal duodenal bulb, second portion of the duodenum and examined duodenum. - repeat in 3 years  04/29/2021 - White nummular lesions in esophageal mucosa. Biopsied.  - 2 cm hiatal hernia.  - Normal mucosa was found in the entire stomach.  - Erythematous duodenopathy.  - Normal second portion of the duodenum.  - Biopsies were obtained in the gastric body, at the incisura and in the gastric antrum. - repeat in 2-3 months  Past Medical History:  Diagnosis Date   Allergy 06/07/2020   Went for Allergy Test   Arthritis    hands, fingers   Cataract    Replaced in 2019   Colon polyp    Diverticulosis    Environmental and seasonal allergies 07/01/2021   Allergy injections from Dr. Juengel     GERD (gastroesophageal reflux disease)    occasionally   History of placement of ear tubes    2012- came out shortly after   Hyperlipidemia    Migraine    daily migraines   Severe asthma    Sinusitis    chronic   Tendonitis    both ankles   Tinnitus     Past Surgical History:  Procedure Laterality Date   BIOPSY N/A 04/29/2021   Procedure: BIOPSY;  Surgeon: Janalyn Keene NOVAK, MD;  Location: [facility];  Service: Endoscopy;  Laterality: N/A;   CATARACT EXTRACTION, BILATERAL     ELBOW SURGERY Right 2007   ESOPHAGOGASTRODUODENOSCOPY (EGD) WITH PROPOFOL  N/A 04/29/2021   Procedure:  ESOPHAGOGASTRODUODENOSCOPY (EGD) WITH PROPOFOL ;  Surgeon: Janalyn Keene NOVAK, MD;  Location: [facility];  Service: Endoscopy;  Laterality: N/A;   ESOPHAGOGASTRODUODENOSCOPY (EGD) WITH PROPOFOL  N/A 07/22/2021   Procedure: ESOPHAGOGASTRODUODENOSCOPY (EGD) WITH PROPOFOL ;  Surgeon: Janalyn Keene NOVAK, MD;  Location: [facility];  Service: Endoscopy;  Laterality: N/A;   ETHMOIDECTOMY Bilateral 10/27/2019   Procedure: TOTAL ETHMOIDECTOMY  WITH FRONTAL SINUOTOMY;  Surgeon: Edda Mt, MD;  Location: [facility];  Service: ENT;  Laterality: Bilateral;   EYE SURGERY  2019   Replaced   HAND SURGERY Right    x4   HERNIA REPAIR  1995   IMAGE GUIDED SINUS SURGERY Bilateral 10/27/2019   Procedure: IMAGE GUIDED SINUS SURGERY;  Surgeon: Edda Mt, MD;  Location: [facility];  Service: ENT;  Laterality: Bilateral;  need stryker disk placed disk on or charge nurse desk 2-17   kp   IMAGE GUIDED SINUS SURGERY Bilateral 04/26/2020   Procedure: IMAGE GUIDED SINUS SURGERY;  Surgeon: Edda Mt, MD;  Location: [facility];  Service: ENT;  Laterality: Bilateral;  need stryker disk gave Erminio disk on 8-23 kp   MENISCUS REPAIR Left 2018   SEPTOPLASTY Bilateral 10/27/2019   Procedure: SEPTOPLASTY REVISION;  Surgeon: Edda Mt, MD;  Location: [facility];  Service: ENT;  Laterality: Bilateral;   SPHENOIDECTOMY Right 04/26/2020   Procedure: CLEONE;  Surgeon: Edda Mt, MD;  Location: [facility];  Service: ENT;  Laterality: Right;   TONSILLECTOMY  1964   WEIL OSTEOTOMY Right 08/05/2024   Procedure: VERDENE FURLONG;  Surgeon: Lennie Barter, DPM;  Location: [facility];  Service: Orthopedics/Podiatry;  Laterality: Right;    Current Outpatient Medications  Medication Sig Dispense Refill   acetaminophen  (TYLENOL ) 500 MG tablet Take 1,000 mg by mouth every 6 (six) hours as needed.     albuterol (PROVENTIL) (2.5 MG/3ML) 0.083% nebulizer solution Take 2.5 mg by nebulization every 2 (two) hours as needed.     APPLE CIDER VINEGAR PO Take 500 mg by mouth daily.     budesonide (PULMICORT) 0.5 MG/2ML nebulizer solution Inhale 0.5 mg into the lungs every morning.     Calcium  Carbonate-Vitamin D (RA CALCIUM  PLUS VITAMIN D PO) Take 600 mg by mouth daily.     Cholecalciferol (VITAMIN D) 50 MCG (2000 UT) CAPS Take 1 capsule by mouth daily.     Coenzyme Q10 (COQ-10) 100 MG CAPS Take 1 each by mouth  2 (two) times daily.     [employer]S TRIPLE MAGNESIUM COMPLEX PO Take 300 mg by mouth daily at 6 (six) AM.     DULoxetine (CYMBALTA) 60 MG capsule Take 60 mg by mouth 2 (two) times daily.     EPINEPHrine  0.3 mg/0.3 mL IJ SOAJ injection Inject 0.3 mg into the muscle as needed.     fexofenadine (ALLEGRA) 180 MG tablet Take 180 mg by mouth every morning.     fluticasone (FLONASE) 50 MCG/ACT nasal spray Place 2 sprays into both nostrils at bedtime.     gabapentin (NEURONTIN) 600 MG tablet Take 600 mg by mouth 3 (three) times daily.     Glucosamine-Chondroitin (GLUCOSAMINE CHONDR COMPLEX PO) Take 1,000 mg by mouth 2 (two) times daily.     ibuprofen (ADVIL) 600 MG tablet Take 600 mg by mouth every 6 (six) hours as needed.     IPRATROPIUM BROMIDE IN Inhale 0.5 mg into the lungs every morning.     Lasmiditan Succinate (REYVOW) 100 MG TABS Take 1 tablet by mouth as needed.  meloxicam  (MOBIC ) 15 MG tablet Take 15 mg by mouth daily.     montelukast (SINGULAIR) 10 MG tablet Take 1 tablet by mouth at bedtime.     naratriptan (AMERGE) 2.5 MG tablet Take 2.5 mg by mouth as needed.     polycarbophil (FIBERCON) 625 MG tablet Take 1,250 mg by mouth 2 (two) times daily.     Probiotic Product (DAILY PROBIOTIC) CAPS      prochlorperazine (COMPAZINE) 10 MG tablet Take 10 mg by mouth every 6 (six) hours as needed for nausea or vomiting (Migraines).     Riboflavin 400 MG TABS Take 1 tablet by mouth daily.     rosuvastatin  (CRESTOR ) 10 MG tablet TAKE 1 TABLET EVERY DAY (Patient taking differently: Take 10 mg by mouth every morning.) 90 tablet 1   traZODone  (DESYREL ) 50 MG tablet TAKE 1 TO 2 TABLETS AT BEDTIME AS NEEDED FOR SLEEP (Patient taking differently: Take 100 mg by mouth at bedtime. TAKE 1 TO 2 TABLETS AT BEDTIME AS NEEDED FOR SLEEP) 180 tablet 3   UNABLE TO FIND 1 Dose once a week. Allergy shots weekly     Vitamin E 180 MG CAPS Take 1 capsule by mouth daily.     Vitamins-Lipotropics (LIPO-FLAVONOID PLUS PO) Take  1,000 mg by mouth 2 (two) times daily.     Zinc 50 MG CAPS Take 1 capsule by mouth daily.     omeprazole  (PRILOSEC) 20 MG capsule TAKE 1 CAPSULE EVERY DAY (Patient not taking: Reported on 09/29/2024) 90 capsule 3   No current facility-administered medications for this visit.    Allergies as of 09/29/2024   (No Known Allergies)    Family History  Problem Relation Age of Onset   Cancer Father     Social History   Socioeconomic History   Marital status: Married    Spouse name: Dickey   Number of children: 1   Years of education: Not on file   Highest education level: 12th grade  Occupational History   Not on file  Tobacco Use   Smoking status: Former    Current packs/day: 0.00    Average packs/day: 1 pack/day for 26.0 years (26.0 ttl pk-yrs)    Types: Cigarettes    Start date: 09/01/1962    Quit date: 09/01/1988    Years since quitting: 36.1   Smokeless tobacco: Never  Vaping Use   Vaping status: Never Used  Substance and Sexual Activity   Alcohol use: Yes    Alcohol/week: 7.0 standard drinks of alcohol    Types: 7 Cans of beer per week    Comment: 1 beer daily   Drug use: Never   Sexual activity: Yes  Other Topics Concern   Not on file  Social History Narrative   ** Merged History Encounter **       Social Drivers of Health   Tobacco Use: Medium Risk (09/29/2024)   Patient History    Smoking Tobacco Use: Former    Smokeless Tobacco Use: Never    Passive Exposure: Not on file  Financial Resource Strain: Low Risk  (08/11/2024)   Received from [facility]   Overall Financial Resource Strain (CARDIA)    Difficulty of Paying Living Expenses: Not hard at all  Food Insecurity: No Food Insecurity (08/11/2024)   Received from [facility]   Epic    Within the past 12 months, you worried that your food would run out before you got the money to buy more.: Never  true    Within the past 12 months, the food you bought just didn't last and  you didn't have money to get more.: Never true  Transportation Needs: No Transportation Needs (08/11/2024)   Received from [facility] - Transportation    In the past 12 months, has lack of transportation kept you from medical appointments or from getting medications?: No    Lack of Transportation (Non-Medical): No  Physical Activity: Insufficiently Active (07/15/2023)   Exercise Vital Sign    Days of Exercise per Week: 2 days    Minutes of Exercise per Session: 30 min  Stress: No Stress Concern Present (07/15/2023)   [employer] - Occupational Stress Questionnaire    Feeling of Stress : Not at all  Social Connections: Moderately Isolated (07/15/2023)   Social Connection and Isolation Panel    Frequency of Communication with Friends and Family: More than three times a week    Frequency of Social Gatherings with Friends and Family: Three times a week    Attends Religious Services: Never    Active Member of Clubs or Organizations: No    Attends Banker Meetings: Never    Marital Status: Married  Catering Manager Violence: Not At Risk (07/15/2023)   Humiliation, Afraid, Rape, and Kick questionnaire    Fear of Current or Ex-Partner: No    Emotionally Abused: No    Physically Abused: No    Sexually Abused: No  Depression (PHQ2-9): Low Risk (07/15/2023)   Depression (PHQ2-9)    PHQ-2 Score: 0  Alcohol Screen: Low Risk (07/15/2023)   Alcohol Screen    Last Alcohol Screening Score (AUDIT): 3  Housing: Low Risk  (08/11/2024)   Received from [facility]   Epic    In the last 12 months, was there a time when you were not able to pay the mortgage or rent on time?: No    In the past 12 months, how many times have you moved where you were living?: 0    At any time in the past 12 months, were you homeless or living in a shelter (including now)?: No  Utilities: Not At Risk (08/11/2024)   Received from [facility]   Epic    In the past 12 months has the electric, gas, oil, or water  company threatened to shut off services in your home?: No  Health Literacy: Adequate Health Literacy (07/15/2023)   B1300 Health Literacy    Frequency of need for help with medical instructions: Never     RELEVANT GI HISTORY, IMAGING AND LABS: CBC    Component Value Date/Time   WBC 7.7 08/01/2024 1425   RBC 4.13 (L) 08/01/2024 1425   HGB 12.9 (L) 08/01/2024 1425   HCT 38.8 (L) 08/01/2024 1425   PLT 240 08/01/2024 1425   MCV 93.9 08/01/2024 1425   MCH 31.2 08/01/2024 1425   MCHC 33.2 08/01/2024 1425   RDW 13.0 08/01/2024 1425   LYMPHSABS 1.7 04/02/2021 1829   MONOABS 0.5 04/02/2021 1829   EOSABS 0.1 04/02/2021 1829   BASOSABS 0.0 04/02/2021 1829   Recent Labs    08/01/24 1425  HGB 12.9*    CMP     Component Value Date/Time   NA 139 08/01/2024 1425   NA 140 07/10/2023 0837   K 4.0 08/01/2024 1425   CL 105 08/01/2024 1425   CO2 26 08/01/2024 1425   GLUCOSE 154 (H) 08/01/2024 1425  BUN 25 (H) 08/01/2024 1425   BUN 26 07/10/2023 0837   CREATININE 1.04 08/01/2024 1425   CALCIUM  8.8 (L) 08/01/2024 1425   PROT 6.5 07/10/2023 0837   ALBUMIN 4.1 07/10/2023 0837   AST 18 07/10/2023 0837   ALT 25 07/10/2023 0837   ALKPHOS 117 07/10/2023 0837   BILITOT 0.3 07/10/2023 0837   GFRNONAA >60 08/01/2024 1425   GFRAA 72 11/03/2019 0945      Latest Ref Rng & Units 07/10/2023    8:37 AM 07/09/2022    9:13 AM 09/03/2021    9:21 AM  Hepatic Function  Total Protein 6.0 - 8.5 g/dL 6.5  6.8    Albumin 3.9 - 4.9 g/dL 4.1  4.5    AST 0 - 40 IU/L 18  15    ALT 0 - 44 IU/L 25  21    Alk Phosphatase 44 - 121 IU/L 117  98  110   Total Bilirubin 0.0 - 1.2 mg/dL 0.3  0.3        Review of Systems   All systems reviewed and negative except where noted in HPI.    Physical Exam  BP 109/74   Pulse 84   Temp 98.3 F (36.8 C)   Ht 5' 4 (1.626 m)   Wt 169 lb 6.4 oz (76.8 kg)   SpO2 97%    BMI 29.08 kg/m  No LMP for male patient. General:   Alert and oriented. Pleasant and cooperative. Well-nourished and well-developed. In no acute distress.  Head:  Normocephalic and atraumatic. Eyes:  Without icterus Ears:  Normal auditory acuity. Chest: Palpable lump noted below the xiphoid process, nontender. Lungs:  Respirations even and unlabored.  Clear throughout to auscultation.   No wheezes, crackles, or rhonchi. No acute distress. Heart:  Regular rate and rhythm; no murmurs, clicks, rubs, or gallops. Abdomen:  Normal bowel sounds.  No bruits.  Soft, non-tender and non-distended without masses, hepatosplenomegaly or hernias noted.  No guarding or rebound tenderness.   Rectal:  Deferred. Msk:  Symmetrical without gross deformities. Normal posture. Extremities:  Without edema. Neurologic:  Alert and  oriented x4;  grossly normal neurologically. Skin:  Intact without significant lesions or rashes. Psych:  Alert and cooperative. Normal mood and affect.   Assessment & Plan   Blake Patrick is a 74 y.o. male presenting today with history of Barrett's esophagus, GERD, constipation, bloating.  Patient reports he is also due for 5-year repeat colonoscopy.  GERD and history of Barrett's esophagus.  Patient reports feeling a lump or knot below his xiphoid process.  - Lifestyle changes discussed: avoid trigger foods, NSAIDS, ETOH; do not eat meals 3 hr prior to bed. - refilled omeprazole  20 mg once daily - Due for repeat endoscopy. I discussed risks of EGD with patient today, including risk of sedation, bleeding or perforation. Patient provides understanding and gave verbal consent to proceed.  - Patient reports feeling a lump or knot below his xiphoid process.   Chronic constipation and bloating Persistent bloating and infrequent bowel movements post-surgery and antibiotics, likely multifactorial. No alarm features, mild upper abdominal tenderness. - Recommended trial of Miralax daily or  every other day, titrating dose as needed. - Recommended addition of Benefiber to increase dietary fiber intake. - Advised increased water  intake to optimize effects of Miralax and fiber. - Provided education on avoiding chronic stimulant laxative use.   Lump palpated below xiphoid process.  Patient reports feeling a knot here and area sometimes painful. Will order chest  x-ray.   Follow up in 2 months  Grayce Bohr, DNP, AGNP-C [facility]  "

## 2024-09-29 ENCOUNTER — Ambulatory Visit: Admitting: Family Medicine

## 2024-09-29 ENCOUNTER — Ambulatory Visit
Admission: RE | Admit: 2024-09-29 | Discharge: 2024-09-29 | Disposition: A | Discharge status: Home / Self Care | Source: Ambulatory Visit | Attending: Family Medicine | Admitting: Family Medicine

## 2024-09-29 ENCOUNTER — Encounter: Payer: Self-pay | Admitting: Family Medicine

## 2024-09-29 ENCOUNTER — Ambulatory Visit
Admission: RE | Admit: 2024-09-29 | Discharge: 2024-09-29 | Disposition: A | Discharge status: Home / Self Care | Attending: Family Medicine | Admitting: Family Medicine

## 2024-09-29 VITALS — BP 109/74 | HR 84 | Temp 98.3°F | Ht 64.0 in | Wt 169.4 lb

## 2024-09-29 DIAGNOSIS — R140 Abdominal distension (gaseous): Secondary | ICD-10-CM

## 2024-09-29 DIAGNOSIS — R079 Chest pain, unspecified: Secondary | ICD-10-CM | POA: Insufficient documentation

## 2024-09-29 DIAGNOSIS — Z860101 Personal history of adenomatous and serrated colon polyps: Secondary | ICD-10-CM

## 2024-09-29 DIAGNOSIS — Z8719 Personal history of other diseases of the digestive system: Secondary | ICD-10-CM | POA: Diagnosis not present

## 2024-09-29 DIAGNOSIS — K219 Gastro-esophageal reflux disease without esophagitis: Secondary | ICD-10-CM | POA: Diagnosis not present

## 2024-09-29 DIAGNOSIS — K5909 Other constipation: Secondary | ICD-10-CM

## 2024-09-29 DIAGNOSIS — R222 Localized swelling, mass and lump, trunk: Secondary | ICD-10-CM

## 2024-09-29 DIAGNOSIS — K22719 Barrett's esophagus with dysplasia, unspecified: Secondary | ICD-10-CM

## 2024-09-29 MED ORDER — OMEPRAZOLE 20 MG PO CPDR: 20.0000 mg | DELAYED_RELEASE_CAPSULE | Freq: Every day | ORAL | 3 refills | Status: AC

## 2024-09-29 MED ORDER — NA SULFATE-K SULFATE-MG SULF 17.5-3.13-1.6 GM/177ML PO SOLN: 1.0000 | Freq: Once | ORAL | 0 refills | Status: AC

## 2024-09-29 NOTE — Patient Instructions (Addendum)
 You may go directly to Outpatient Imaging to have the Chest xray done- [id number]  [place]   Start OTC Benefiber Powder. Mix 1 - 2 Tablespoons in 6 - 8 ounces of a Drink Once Daily. Drink 64 ounces of water  / fluids Daily.   For constipation: Start OTC Miralax Powder Mix 1 capful in 6 to 8 ounces of a drink once daily  Recommend high-fiber diet, 30 g of fiber daily Eat fruits, vegetables, and whole grains

## 2024-10-02 ENCOUNTER — Ambulatory Visit: Payer: Self-pay | Admitting: Family Medicine

## 2024-10-04 ENCOUNTER — Encounter: Payer: Self-pay | Admitting: Family Medicine

## 2024-10-14 ENCOUNTER — Ambulatory Visit: Admission: RE | Admit: 2024-10-14 | Source: Home / Self Care

## 2024-10-14 ENCOUNTER — Encounter: Admission: RE | Payer: Self-pay | Source: Home / Self Care

## 2024-11-28 ENCOUNTER — Ambulatory Visit: Admitting: Family Medicine
